# Patient Record
Sex: Female | Born: 1935 | Race: White | Hispanic: No | Marital: Single | Smoking: Never smoker
Health system: Southern US, Community
[De-identification: ages and names within clinical notes are randomized; demographics above are authoritative.]

## PROBLEM LIST (undated history)

## (undated) DIAGNOSIS — C801 Malignant (primary) neoplasm, unspecified: Secondary | ICD-10-CM

## (undated) DIAGNOSIS — I872 Venous insufficiency (chronic) (peripheral): Secondary | ICD-10-CM

## (undated) DIAGNOSIS — I10 Essential (primary) hypertension: Secondary | ICD-10-CM

## (undated) DIAGNOSIS — I83893 Varicose veins of bilateral lower extremities with other complications: Secondary | ICD-10-CM

## (undated) DIAGNOSIS — Z85038 Personal history of other malignant neoplasm of large intestine: Secondary | ICD-10-CM

## (undated) HISTORY — PX: ABDOMINAL HYSTERECTOMY: SHX81

## (undated) HISTORY — PX: LEG SURGERY: SHX1003

## (undated) HISTORY — PX: OTHER SURGICAL HISTORY: SHX169

## (undated) HISTORY — DX: Venous insufficiency (chronic) (peripheral): I87.2

## (undated) HISTORY — PX: COLONOSCOPY: SHX174

## (undated) HISTORY — PX: CARDIOVERSION: SHX1299

## (undated) HISTORY — PX: BREAST BIOPSY: SHX20

## (undated) HISTORY — DX: Essential (primary) hypertension: I10

## (undated) HISTORY — DX: Varicose veins of bilateral lower extremities with other complications: I83.893

## (undated) HISTORY — DX: Malignant (primary) neoplasm, unspecified: C80.1

## (undated) HISTORY — DX: Personal history of other malignant neoplasm of large intestine: Z85.038

---

## 2001-06-06 HISTORY — PX: TONGUE BIOPSY: SHX1075

## 2001-06-06 HISTORY — PX: NECK SURGERY: SHX720

## 2004-03-18 ENCOUNTER — Ambulatory Visit: Payer: Self-pay | Admitting: *Deleted

## 2004-03-25 ENCOUNTER — Emergency Department: Payer: Self-pay | Admitting: Emergency Medicine

## 2004-11-23 ENCOUNTER — Ambulatory Visit: Payer: Self-pay

## 2004-11-25 ENCOUNTER — Ambulatory Visit: Payer: Self-pay | Admitting: Otolaryngology

## 2004-11-30 ENCOUNTER — Ambulatory Visit: Payer: Self-pay

## 2004-12-01 ENCOUNTER — Ambulatory Visit: Payer: Self-pay | Admitting: Otolaryngology

## 2004-12-06 ENCOUNTER — Ambulatory Visit: Payer: Self-pay | Admitting: Oncology

## 2004-12-10 ENCOUNTER — Ambulatory Visit: Payer: Self-pay | Admitting: Oncology

## 2004-12-14 ENCOUNTER — Inpatient Hospital Stay: Payer: Self-pay | Admitting: Internal Medicine

## 2005-01-04 ENCOUNTER — Inpatient Hospital Stay: Payer: Self-pay | Admitting: Oncology

## 2005-01-12 ENCOUNTER — Emergency Department: Payer: Self-pay | Admitting: Emergency Medicine

## 2005-01-13 ENCOUNTER — Ambulatory Visit: Payer: Self-pay | Admitting: Oncology

## 2005-02-04 ENCOUNTER — Ambulatory Visit: Payer: Self-pay | Admitting: Oncology

## 2005-03-06 ENCOUNTER — Ambulatory Visit: Payer: Self-pay | Admitting: Oncology

## 2005-04-06 ENCOUNTER — Ambulatory Visit: Payer: Self-pay | Admitting: Oncology

## 2005-05-06 ENCOUNTER — Ambulatory Visit: Payer: Self-pay | Admitting: Oncology

## 2005-05-07 ENCOUNTER — Ambulatory Visit: Payer: Self-pay | Admitting: Oncology

## 2005-06-06 ENCOUNTER — Ambulatory Visit: Payer: Self-pay | Admitting: Oncology

## 2005-07-07 ENCOUNTER — Ambulatory Visit: Payer: Self-pay | Admitting: Oncology

## 2005-08-04 ENCOUNTER — Ambulatory Visit: Payer: Self-pay | Admitting: Oncology

## 2005-08-11 ENCOUNTER — Ambulatory Visit: Payer: Self-pay | Admitting: Gastroenterology

## 2005-08-23 ENCOUNTER — Other Ambulatory Visit: Payer: Self-pay

## 2005-08-23 ENCOUNTER — Ambulatory Visit: Payer: Self-pay | Admitting: General Surgery

## 2005-08-25 ENCOUNTER — Ambulatory Visit: Payer: Self-pay | Admitting: General Surgery

## 2005-08-31 ENCOUNTER — Inpatient Hospital Stay: Payer: Self-pay | Admitting: General Surgery

## 2005-09-04 ENCOUNTER — Ambulatory Visit: Payer: Self-pay | Admitting: Oncology

## 2005-10-20 ENCOUNTER — Ambulatory Visit: Payer: Self-pay | Admitting: Oncology

## 2005-11-04 ENCOUNTER — Ambulatory Visit: Payer: Self-pay | Admitting: Oncology

## 2005-12-04 ENCOUNTER — Ambulatory Visit: Payer: Self-pay | Admitting: Oncology

## 2006-01-13 ENCOUNTER — Ambulatory Visit: Payer: Self-pay | Admitting: Oncology

## 2006-01-17 ENCOUNTER — Ambulatory Visit: Payer: Self-pay | Admitting: Oncology

## 2006-01-27 ENCOUNTER — Ambulatory Visit: Payer: Self-pay | Admitting: Unknown Physician Specialty

## 2006-02-04 ENCOUNTER — Ambulatory Visit: Payer: Self-pay | Admitting: Oncology

## 2006-03-28 ENCOUNTER — Ambulatory Visit: Payer: Self-pay | Admitting: Oncology

## 2006-04-10 ENCOUNTER — Ambulatory Visit: Payer: Self-pay | Admitting: Oncology

## 2006-04-19 ENCOUNTER — Encounter: Payer: Self-pay | Admitting: Oncology

## 2006-05-06 ENCOUNTER — Ambulatory Visit: Payer: Self-pay | Admitting: Oncology

## 2006-06-20 ENCOUNTER — Ambulatory Visit: Payer: Self-pay | Admitting: Anesthesiology

## 2006-07-11 ENCOUNTER — Ambulatory Visit: Payer: Self-pay | Admitting: Anesthesiology

## 2006-07-14 ENCOUNTER — Ambulatory Visit: Payer: Self-pay | Admitting: Oncology

## 2006-08-05 ENCOUNTER — Ambulatory Visit: Payer: Self-pay | Admitting: Oncology

## 2006-08-08 ENCOUNTER — Ambulatory Visit: Payer: Self-pay | Admitting: Anesthesiology

## 2006-10-13 ENCOUNTER — Ambulatory Visit: Payer: Self-pay | Admitting: Oncology

## 2006-10-17 ENCOUNTER — Ambulatory Visit: Payer: Self-pay | Admitting: Anesthesiology

## 2006-11-05 ENCOUNTER — Ambulatory Visit: Payer: Self-pay | Admitting: Oncology

## 2006-12-05 ENCOUNTER — Ambulatory Visit: Payer: Self-pay | Admitting: Oncology

## 2007-01-05 ENCOUNTER — Ambulatory Visit: Payer: Self-pay | Admitting: Oncology

## 2007-01-12 ENCOUNTER — Ambulatory Visit: Payer: Self-pay | Admitting: Oncology

## 2007-02-05 ENCOUNTER — Ambulatory Visit: Payer: Self-pay | Admitting: Oncology

## 2007-02-21 ENCOUNTER — Ambulatory Visit: Payer: Self-pay | Admitting: Anesthesiology

## 2007-04-07 ENCOUNTER — Ambulatory Visit: Payer: Self-pay | Admitting: Oncology

## 2007-04-27 ENCOUNTER — Ambulatory Visit: Payer: Self-pay | Admitting: Oncology

## 2007-05-07 ENCOUNTER — Ambulatory Visit: Payer: Self-pay | Admitting: Oncology

## 2007-08-03 ENCOUNTER — Ambulatory Visit: Payer: Self-pay | Admitting: Oncology

## 2007-08-09 ENCOUNTER — Ambulatory Visit: Payer: Self-pay | Admitting: Oncology

## 2007-09-05 ENCOUNTER — Ambulatory Visit: Payer: Self-pay | Admitting: Oncology

## 2007-10-05 ENCOUNTER — Ambulatory Visit: Payer: Self-pay | Admitting: Oncology

## 2007-11-26 ENCOUNTER — Ambulatory Visit: Payer: Self-pay | Admitting: General Surgery

## 2008-01-18 ENCOUNTER — Ambulatory Visit: Payer: Self-pay | Admitting: Oncology

## 2008-02-05 ENCOUNTER — Ambulatory Visit: Payer: Self-pay | Admitting: Oncology

## 2008-03-06 ENCOUNTER — Ambulatory Visit: Payer: Self-pay | Admitting: Oncology

## 2008-05-06 ENCOUNTER — Ambulatory Visit: Payer: Self-pay | Admitting: Oncology

## 2008-05-16 ENCOUNTER — Ambulatory Visit: Payer: Self-pay | Admitting: Oncology

## 2008-06-06 ENCOUNTER — Ambulatory Visit: Payer: Self-pay | Admitting: Oncology

## 2008-09-04 ENCOUNTER — Ambulatory Visit: Payer: Self-pay | Admitting: Oncology

## 2008-09-22 ENCOUNTER — Ambulatory Visit: Payer: Self-pay | Admitting: Oncology

## 2008-10-04 ENCOUNTER — Ambulatory Visit: Payer: Self-pay | Admitting: Oncology

## 2009-01-23 ENCOUNTER — Ambulatory Visit: Payer: Self-pay | Admitting: Oncology

## 2009-02-04 ENCOUNTER — Ambulatory Visit: Payer: Self-pay | Admitting: Oncology

## 2009-05-15 ENCOUNTER — Ambulatory Visit: Payer: Self-pay | Admitting: Oncology

## 2009-05-25 ENCOUNTER — Ambulatory Visit: Payer: Self-pay | Admitting: Oncology

## 2009-06-06 ENCOUNTER — Ambulatory Visit: Payer: Self-pay | Admitting: Oncology

## 2009-07-03 ENCOUNTER — Ambulatory Visit: Payer: Self-pay | Admitting: Oncology

## 2009-07-07 ENCOUNTER — Ambulatory Visit: Payer: Self-pay | Admitting: Oncology

## 2009-12-04 ENCOUNTER — Ambulatory Visit: Payer: Self-pay | Admitting: Oncology

## 2009-12-18 ENCOUNTER — Ambulatory Visit: Payer: Self-pay | Admitting: Oncology

## 2010-01-01 ENCOUNTER — Ambulatory Visit: Payer: Self-pay | Admitting: Oncology

## 2010-01-04 ENCOUNTER — Ambulatory Visit: Payer: Self-pay | Admitting: Oncology

## 2010-07-02 ENCOUNTER — Ambulatory Visit: Payer: Self-pay | Admitting: Oncology

## 2010-07-03 LAB — CEA: CEA: 4.3 ng/mL (ref 0.0–4.7)

## 2010-07-07 ENCOUNTER — Ambulatory Visit: Payer: Self-pay | Admitting: Oncology

## 2010-12-31 ENCOUNTER — Ambulatory Visit: Payer: Self-pay | Admitting: Oncology

## 2011-01-01 LAB — CEA: CEA: 6.6 ng/mL — ABNORMAL HIGH (ref 0.0–4.7)

## 2011-01-05 ENCOUNTER — Ambulatory Visit: Payer: Self-pay | Admitting: Oncology

## 2011-04-29 ENCOUNTER — Emergency Department: Payer: Self-pay | Admitting: Internal Medicine

## 2011-07-11 ENCOUNTER — Ambulatory Visit: Payer: Self-pay | Admitting: Oncology

## 2011-07-11 LAB — CBC CANCER CENTER
Basophil #: 0 x10 3/mm (ref 0.0–0.1)
Basophil %: 0.4 %
HCT: 34.6 % — ABNORMAL LOW (ref 35.0–47.0)
HGB: 12 g/dL (ref 12.0–16.0)
Lymphocyte #: 0.5 x10 3/mm — ABNORMAL LOW (ref 1.0–3.6)
Lymphocyte %: 9.6 %
MCV: 93 fL (ref 80–100)
Monocyte %: 6.6 %
Neutrophil #: 4.6 x10 3/mm (ref 1.4–6.5)
RDW: 12.8 % (ref 11.5–14.5)
WBC: 5.6 x10 3/mm (ref 3.6–11.0)

## 2011-07-11 LAB — COMPREHENSIVE METABOLIC PANEL
Calcium, Total: 8.7 mg/dL (ref 8.5–10.1)
Chloride: 100 mmol/L (ref 98–107)
Co2: 29 mmol/L (ref 21–32)
EGFR (African American): >60
EGFR (Non-African Amer.): >60
SGOT(AST): 20 U/L (ref 15–37)
SGPT (ALT): 17 U/L

## 2011-07-12 LAB — CEA: CEA: 6.8 ng/mL — ABNORMAL HIGH (ref 0.0–4.7)

## 2011-08-05 ENCOUNTER — Ambulatory Visit: Payer: Self-pay | Admitting: Oncology

## 2011-08-23 ENCOUNTER — Ambulatory Visit: Payer: Self-pay | Admitting: Oncology

## 2011-08-30 ENCOUNTER — Ambulatory Visit: Payer: Self-pay | Admitting: Neurology

## 2011-09-05 ENCOUNTER — Ambulatory Visit: Payer: Self-pay | Admitting: Oncology

## 2011-11-29 ENCOUNTER — Ambulatory Visit: Payer: Self-pay | Admitting: Cardiovascular Disease

## 2012-01-04 ENCOUNTER — Ambulatory Visit: Payer: Self-pay | Admitting: General Surgery

## 2012-01-23 ENCOUNTER — Ambulatory Visit: Payer: Self-pay | Admitting: Oncology

## 2012-01-23 LAB — CBC CANCER CENTER
Basophil %: 1.2 %
HCT: 39.3 % (ref 35.0–47.0)
HGB: 12.9 g/dL (ref 12.0–16.0)
Lymphocyte %: 10.4 %
MCHC: 32.9 g/dL (ref 32.0–36.0)
MCV: 97 fL (ref 80–100)
Monocyte %: 8.5 %
Neutrophil #: 4.8 x10 3/mm (ref 1.4–6.5)
RBC: 4.06 10*6/uL (ref 3.80–5.20)
WBC: 6.2 x10 3/mm (ref 3.6–11.0)

## 2012-01-23 LAB — TSH: Thyroid Stimulating Horm: 7.29 u[IU]/mL — ABNORMAL HIGH

## 2012-01-24 LAB — CEA: CEA: 10.4 ng/mL — ABNORMAL HIGH (ref 0.0–4.7)

## 2012-02-05 ENCOUNTER — Ambulatory Visit: Payer: Self-pay | Admitting: Oncology

## 2012-09-18 ENCOUNTER — Ambulatory Visit: Payer: Self-pay | Admitting: Oncology

## 2012-12-03 ENCOUNTER — Inpatient Hospital Stay: Payer: Self-pay | Admitting: Internal Medicine

## 2012-12-03 LAB — URINALYSIS, COMPLETE
Hyaline Cast: 1
Leukocyte Esterase: NEGATIVE
Nitrite: NEGATIVE
Ph: 5 (ref 4.5–8.0)
Protein: NEGATIVE
RBC,UR: <1 /HPF (ref 0–5)
Specific Gravity: 1.008 (ref 1.003–1.030)
Squamous Epithelial: <1
WBC UR: <1 /HPF (ref 0–5)

## 2012-12-03 LAB — COMPREHENSIVE METABOLIC PANEL
Alkaline Phosphatase: 95 U/L (ref 50–136)
Anion Gap: 9 (ref 7–16)
BUN: 9 mg/dL (ref 7–18)
Creatinine: 0.88 mg/dL (ref 0.60–1.30)
Glucose: 97 mg/dL (ref 65–99)
SGOT(AST): 25 U/L (ref 15–37)
SGPT (ALT): 18 U/L (ref 12–78)
Total Protein: 6.7 g/dL (ref 6.4–8.2)

## 2012-12-03 LAB — BILIRUBIN, DIRECT: Bilirubin, Direct: 0.3 mg/dL — ABNORMAL HIGH (ref 0.00–0.20)

## 2012-12-03 LAB — CK TOTAL AND CKMB (NOT AT ARMC)
CK, Total: 69 U/L (ref 21–215)
CK-MB: 2.5 ng/mL (ref 0.5–3.6)

## 2012-12-03 LAB — DRUG SCREEN, URINE
Amphetamines, Ur Screen: NEGATIVE (ref ?–1000)
Barbiturates, Ur Screen: NEGATIVE (ref ?–200)
Cannabinoid 50 Ng, Ur ~~LOC~~: NEGATIVE (ref ?–50)
MDMA (Ecstasy)Ur Screen: NEGATIVE (ref ?–500)
Opiate, Ur Screen: NEGATIVE (ref ?–300)
Tricyclic, Ur Screen: NEGATIVE (ref ?–1000)

## 2012-12-03 LAB — CBC
HCT: 36.5 % (ref 35.0–47.0)
HGB: 12.3 g/dL (ref 12.0–16.0)
MCHC: 33.7 g/dL (ref 32.0–36.0)
Platelet: 184 10*3/uL (ref 150–440)
WBC: 3.4 10*3/uL — ABNORMAL LOW (ref 3.6–11.0)

## 2012-12-03 LAB — TROPONIN I
Troponin-I: <0.02 ng/mL
Troponin-I: 0.04 ng/mL

## 2012-12-03 LAB — ETHANOL: Ethanol: <3 mg/dL

## 2012-12-03 LAB — TSH: Thyroid Stimulating Horm: 3.39 u[IU]/mL

## 2012-12-03 LAB — PROTIME-INR: INR: 3.5

## 2012-12-04 LAB — COMPREHENSIVE METABOLIC PANEL
Alkaline Phosphatase: 99 U/L (ref 50–136)
BUN: 9 mg/dL (ref 7–18)
Calcium, Total: 8.5 mg/dL (ref 8.5–10.1)
Chloride: 106 mmol/L (ref 98–107)
Co2: 25 mmol/L (ref 21–32)
EGFR (Non-African Amer.): >60
Sodium: 139 mmol/L (ref 136–145)

## 2012-12-04 LAB — CBC WITH DIFFERENTIAL/PLATELET
Eosinophil %: 2.1 %
HCT: 36.3 % (ref 35.0–47.0)
Lymphocyte #: 0.5 10*3/uL — ABNORMAL LOW (ref 1.0–3.6)
Monocyte #: 0.5 x10 3/mm (ref 0.2–0.9)
Monocyte %: 9.3 %
Neutrophil #: 4.5 10*3/uL (ref 1.4–6.5)
Platelet: 184 10*3/uL (ref 150–440)
RBC: 3.87 10*6/uL (ref 3.80–5.20)
RDW: 14.5 % (ref 11.5–14.5)
WBC: 5.7 10*3/uL (ref 3.6–11.0)

## 2012-12-04 LAB — PROTIME-INR: Prothrombin Time: 29.8 secs — ABNORMAL HIGH (ref 11.5–14.7)

## 2012-12-04 LAB — CK TOTAL AND CKMB (NOT AT ARMC): CK, Total: 67 U/L (ref 21–215)

## 2012-12-05 LAB — CBC WITH DIFFERENTIAL/PLATELET
Basophil #: 0.1 10*3/uL (ref 0.0–0.1)
Basophil %: 1.4 %
Eosinophil %: 5.1 %
Lymphocyte #: 0.6 10*3/uL — ABNORMAL LOW (ref 1.0–3.6)
Lymphocyte %: 12.9 %
Platelet: 184 10*3/uL (ref 150–440)

## 2012-12-06 LAB — CBC WITH DIFFERENTIAL/PLATELET
Basophil #: 0.1 10*3/uL (ref 0.0–0.1)
Lymphocyte %: 11.7 %
MCH: 32.1 pg (ref 26.0–34.0)
MCHC: 34 g/dL (ref 32.0–36.0)
Monocyte #: 0.5 x10 3/mm (ref 0.2–0.9)
Neutrophil #: 3.6 10*3/uL (ref 1.4–6.5)
RDW: 14.8 % — ABNORMAL HIGH (ref 11.5–14.5)

## 2012-12-13 ENCOUNTER — Inpatient Hospital Stay: Payer: Self-pay | Admitting: Internal Medicine

## 2012-12-13 LAB — CBC WITH DIFFERENTIAL/PLATELET
Eosinophil #: 0.1 10*3/uL (ref 0.0–0.7)
HGB: 13.2 g/dL (ref 12.0–16.0)
Lymphocyte #: 1 10*3/uL (ref 1.0–3.6)
MCH: 31.6 pg (ref 26.0–34.0)
Monocyte #: 0.8 x10 3/mm (ref 0.2–0.9)
Monocyte %: 10.6 %
Platelet: 220 10*3/uL (ref 150–440)

## 2012-12-13 LAB — COMPREHENSIVE METABOLIC PANEL
Anion Gap: 6 — ABNORMAL LOW (ref 7–16)
EGFR (Non-African Amer.): 50 — ABNORMAL LOW
Potassium: 4.4 mmol/L (ref 3.5–5.1)
SGOT(AST): 26 U/L (ref 15–37)
Total Protein: 7 g/dL (ref 6.4–8.2)

## 2012-12-13 LAB — URINALYSIS, COMPLETE
Glucose,UR: NEGATIVE mg/dL (ref 0–75)
Ph: 5 (ref 4.5–8.0)
Protein: 100

## 2012-12-13 LAB — PROTIME-INR: INR: 2.4

## 2012-12-13 LAB — APTT: Activated PTT: 41.2 secs — ABNORMAL HIGH (ref 23.6–35.9)

## 2012-12-13 LAB — CK TOTAL AND CKMB (NOT AT ARMC): CK-MB: 2.5 ng/mL (ref 0.5–3.6)

## 2012-12-14 LAB — BASIC METABOLIC PANEL
BUN: 14 mg/dL (ref 7–18)
Chloride: 107 mmol/L (ref 98–107)
Co2: 22 mmol/L (ref 21–32)
Creatinine: 1.03 mg/dL (ref 0.60–1.30)
EGFR (Non-African Amer.): 53 — ABNORMAL LOW
Glucose: 78 mg/dL (ref 65–99)
Osmolality: 277 (ref 275–301)
Sodium: 139 mmol/L (ref 136–145)

## 2012-12-14 LAB — MAGNESIUM: Magnesium: 1.2 mg/dL — ABNORMAL LOW

## 2012-12-14 LAB — CBC WITH DIFFERENTIAL/PLATELET
Basophil #: 0 10*3/uL (ref 0.0–0.1)
Basophil %: 0.8 %
Eosinophil #: 0.1 10*3/uL (ref 0.0–0.7)
Eosinophil %: 1.5 %
HCT: 34.9 % — ABNORMAL LOW (ref 35.0–47.0)
HGB: 11.5 g/dL — ABNORMAL LOW (ref 12.0–16.0)
MCHC: 32.9 g/dL (ref 32.0–36.0)
MCV: 96 fL (ref 80–100)
Neutrophil %: 70.9 %
Platelet: 173 10*3/uL (ref 150–440)
RDW: 14.3 % (ref 11.5–14.5)
WBC: 4.4 10*3/uL (ref 3.6–11.0)

## 2012-12-14 LAB — COMPREHENSIVE METABOLIC PANEL
Albumin: 2.9 g/dL — ABNORMAL LOW (ref 3.4–5.0)
Alkaline Phosphatase: 90 U/L (ref 50–136)
Anion Gap: 5 — ABNORMAL LOW (ref 7–16)
Bilirubin,Total: 0.8 mg/dL (ref 0.2–1.0)
Calcium, Total: 8.4 mg/dL — ABNORMAL LOW (ref 8.5–10.1)
Chloride: 107 mmol/L (ref 98–107)
Creatinine: 0.99 mg/dL (ref 0.60–1.30)
EGFR (African American): >60
EGFR (Non-African Amer.): 56 — ABNORMAL LOW
Osmolality: 277 (ref 275–301)
Potassium: 4.1 mmol/L (ref 3.5–5.1)
Sodium: 139 mmol/L (ref 136–145)

## 2012-12-15 LAB — URINE CULTURE

## 2012-12-16 LAB — CBC WITH DIFFERENTIAL/PLATELET
Basophil #: 0 10*3/uL (ref 0.0–0.1)
Eosinophil #: 0 10*3/uL (ref 0.0–0.7)
HCT: 41.7 % (ref 35.0–47.0)
HGB: 13.9 g/dL (ref 12.0–16.0)
Lymphocyte #: 0.6 10*3/uL — ABNORMAL LOW (ref 1.0–3.6)
MCHC: 33.2 g/dL (ref 32.0–36.0)
Monocyte %: 7.5 %
Neutrophil #: 8.4 10*3/uL — ABNORMAL HIGH (ref 1.4–6.5)
Neutrophil %: 85.7 %
Platelet: 242 10*3/uL (ref 150–440)
RBC: 4.38 10*6/uL (ref 3.80–5.20)
WBC: 9.8 10*3/uL (ref 3.6–11.0)

## 2012-12-17 LAB — BASIC METABOLIC PANEL
Anion Gap: 7 (ref 7–16)
BUN: 17 mg/dL (ref 7–18)
Calcium, Total: 8.8 mg/dL (ref 8.5–10.1)
Chloride: 107 mmol/L (ref 98–107)
Co2: 26 mmol/L (ref 21–32)
Creatinine: 1.03 mg/dL (ref 0.60–1.30)
EGFR (African American): >60
EGFR (Non-African Amer.): 53 — ABNORMAL LOW
Glucose: 96 mg/dL (ref 65–99)
Osmolality: 281 (ref 275–301)
Potassium: 4 mmol/L (ref 3.5–5.1)

## 2012-12-17 LAB — PROTIME-INR
INR: 3.1
Prothrombin Time: 31 secs — ABNORMAL HIGH (ref 11.5–14.7)

## 2012-12-18 LAB — CBC WITH DIFFERENTIAL/PLATELET
Basophil #: 0 10*3/uL (ref 0.0–0.1)
Basophil %: 0.5 %
Eosinophil #: 0 10*3/uL (ref 0.0–0.7)
Eosinophil %: 0.7 %
HCT: 40.7 % (ref 35.0–47.0)
Lymphocyte #: 0.4 10*3/uL — ABNORMAL LOW (ref 1.0–3.6)
MCHC: 33.5 g/dL (ref 32.0–36.0)
Monocyte #: 0.5 x10 3/mm (ref 0.2–0.9)
Monocyte %: 7.8 %
Neutrophil #: 5.1 10*3/uL (ref 1.4–6.5)
Platelet: 205 10*3/uL (ref 150–440)
RBC: 4.31 10*6/uL (ref 3.80–5.20)
WBC: 6.1 10*3/uL (ref 3.6–11.0)

## 2012-12-18 LAB — CULTURE, BLOOD (SINGLE)

## 2012-12-18 LAB — BASIC METABOLIC PANEL
Anion Gap: 10 (ref 7–16)
Calcium, Total: 8.9 mg/dL (ref 8.5–10.1)
Chloride: 104 mmol/L (ref 98–107)
Creatinine: 1 mg/dL (ref 0.60–1.30)
EGFR (Non-African Amer.): 55 — ABNORMAL LOW
Glucose: 77 mg/dL (ref 65–99)
Osmolality: 275 (ref 275–301)

## 2012-12-18 LAB — PROTIME-INR: INR: 2.9

## 2012-12-18 LAB — MAGNESIUM: Magnesium: 1.4 mg/dL — ABNORMAL LOW

## 2012-12-19 LAB — CBC WITH DIFFERENTIAL/PLATELET
Basophil #: 0 10*3/uL (ref 0.0–0.1)
Basophil %: 0.6 %
Eosinophil %: 1.2 %
HGB: 13.6 g/dL (ref 12.0–16.0)
Lymphocyte %: 7.1 %
MCH: 31.4 pg (ref 26.0–34.0)
MCHC: 33.9 g/dL (ref 32.0–36.0)
Monocyte #: 0.5 x10 3/mm (ref 0.2–0.9)
Neutrophil #: 5.6 10*3/uL (ref 1.4–6.5)
Neutrophil %: 84 %
RDW: 14.5 % (ref 11.5–14.5)
WBC: 6.7 10*3/uL (ref 3.6–11.0)

## 2012-12-19 LAB — BASIC METABOLIC PANEL
Anion Gap: 5 — ABNORMAL LOW (ref 7–16)
Calcium, Total: 8.3 mg/dL — ABNORMAL LOW (ref 8.5–10.1)
EGFR (Non-African Amer.): >60
Glucose: 119 mg/dL — ABNORMAL HIGH (ref 65–99)
Osmolality: 274 (ref 275–301)
Potassium: 3.4 mmol/L — ABNORMAL LOW (ref 3.5–5.1)
Sodium: 137 mmol/L (ref 136–145)

## 2012-12-19 LAB — PROTIME-INR: INR: 2.1

## 2012-12-20 ENCOUNTER — Encounter: Payer: Self-pay | Admitting: *Deleted

## 2013-01-04 ENCOUNTER — Encounter (HOSPITAL_COMMUNITY): Payer: Self-pay | Admitting: Emergency Medicine

## 2013-01-04 ENCOUNTER — Emergency Department (HOSPITAL_COMMUNITY): Payer: Medicare Other

## 2013-01-04 ENCOUNTER — Inpatient Hospital Stay (HOSPITAL_COMMUNITY)
Admission: EM | Admit: 2013-01-04 | Discharge: 2013-01-08 | DRG: 689 | Disposition: A | Discharge status: Home Health | Payer: Medicare Other | Attending: Internal Medicine | Admitting: Internal Medicine

## 2013-01-04 DIAGNOSIS — I059 Rheumatic mitral valve disease, unspecified: Secondary | ICD-10-CM | POA: Diagnosis present

## 2013-01-04 DIAGNOSIS — T50995A Adverse effect of other drugs, medicaments and biological substances, initial encounter: Secondary | ICD-10-CM | POA: Diagnosis present

## 2013-01-04 DIAGNOSIS — T460X5A Adverse effect of cardiac-stimulant glycosides and drugs of similar action, initial encounter: Secondary | ICD-10-CM | POA: Diagnosis present

## 2013-01-04 DIAGNOSIS — Z85038 Personal history of other malignant neoplasm of large intestine: Secondary | ICD-10-CM | POA: Diagnosis present

## 2013-01-04 DIAGNOSIS — N390 Urinary tract infection, site not specified: Principal | ICD-10-CM | POA: Diagnosis present

## 2013-01-04 DIAGNOSIS — R001 Bradycardia, unspecified: Secondary | ICD-10-CM

## 2013-01-04 DIAGNOSIS — E860 Dehydration: Secondary | ICD-10-CM | POA: Diagnosis present

## 2013-01-04 DIAGNOSIS — I498 Other specified cardiac arrhythmias: Secondary | ICD-10-CM | POA: Diagnosis present

## 2013-01-04 DIAGNOSIS — F0390 Unspecified dementia without behavioral disturbance: Secondary | ICD-10-CM | POA: Diagnosis present

## 2013-01-04 DIAGNOSIS — R4182 Altered mental status, unspecified: Secondary | ICD-10-CM | POA: Diagnosis present

## 2013-01-04 DIAGNOSIS — G9340 Encephalopathy, unspecified: Secondary | ICD-10-CM | POA: Diagnosis present

## 2013-01-04 DIAGNOSIS — I4891 Unspecified atrial fibrillation: Secondary | ICD-10-CM | POA: Diagnosis present

## 2013-01-04 DIAGNOSIS — E876 Hypokalemia: Secondary | ICD-10-CM | POA: Diagnosis present

## 2013-01-04 DIAGNOSIS — I10 Essential (primary) hypertension: Secondary | ICD-10-CM | POA: Diagnosis present

## 2013-01-04 DIAGNOSIS — I82A19 Acute embolism and thrombosis of unspecified axillary vein: Secondary | ICD-10-CM | POA: Diagnosis not present

## 2013-01-04 DIAGNOSIS — J4489 Other specified chronic obstructive pulmonary disease: Secondary | ICD-10-CM | POA: Diagnosis present

## 2013-01-04 DIAGNOSIS — J449 Chronic obstructive pulmonary disease, unspecified: Secondary | ICD-10-CM | POA: Diagnosis present

## 2013-01-04 DIAGNOSIS — Z7901 Long term (current) use of anticoagulants: Secondary | ICD-10-CM

## 2013-01-04 DIAGNOSIS — Z79899 Other long term (current) drug therapy: Secondary | ICD-10-CM

## 2013-01-04 LAB — URINALYSIS, ROUTINE W REFLEX MICROSCOPIC
Glucose, UA: NEGATIVE mg/dL
Nitrite: NEGATIVE
Specific Gravity, Urine: 1.02 (ref 1.005–1.030)
pH: 6 (ref 5.0–8.0)

## 2013-01-04 LAB — COMPREHENSIVE METABOLIC PANEL
ALT: 10 U/L (ref 0–35)
Albumin: 2.8 g/dL — ABNORMAL LOW (ref 3.5–5.2)
BUN: 21 mg/dL (ref 6–23)
Calcium: 8.6 mg/dL (ref 8.4–10.5)
GFR calc Af Amer: >90 mL/min (ref >90)
Glucose, Bld: 105 mg/dL — ABNORMAL HIGH (ref 70–99)
Potassium: 3.2 mEq/L — ABNORMAL LOW (ref 3.5–5.1)
Sodium: 143 mEq/L (ref 135–145)
Total Protein: 6.1 g/dL (ref 6.0–8.3)

## 2013-01-04 LAB — URINE MICROSCOPIC-ADD ON

## 2013-01-04 LAB — CBC WITH DIFFERENTIAL/PLATELET
Basophils Relative: 0 % (ref 0–1)
Eosinophils Absolute: 0 10*3/uL (ref 0.0–0.7)
Eosinophils Relative: 0 % (ref 0–5)
Lymphs Abs: 0.9 10*3/uL (ref 0.7–4.0)
MCH: 30.6 pg (ref 26.0–34.0)
MCHC: 32.8 g/dL (ref 30.0–36.0)
MCV: 93.4 fL (ref 78.0–100.0)
Neutrophils Relative %: 79 % — ABNORMAL HIGH (ref 43–77)
Platelets: 215 10*3/uL (ref 150–400)
RBC: 4.08 MIL/uL (ref 3.87–5.11)

## 2013-01-04 MED ORDER — DONEPEZIL HCL 5 MG PO TABS
5.0000 mg | ORAL_TABLET | Freq: Every day | ORAL | Status: DC
  Administered 2013-01-04 – 2013-01-07 (×4): 5 mg via ORAL
  Filled 2013-01-04 (×5): qty 1

## 2013-01-04 MED ORDER — HEPARIN SODIUM (PORCINE) 5000 UNIT/ML IJ SOLN
5000.0000 [IU] | Freq: Three times a day (TID) | INTRAMUSCULAR | Status: DC
  Administered 2013-01-04 – 2013-01-05 (×2): 5000 [IU] via SUBCUTANEOUS
  Filled 2013-01-04 (×5): qty 1

## 2013-01-04 MED ORDER — BUDESONIDE-FORMOTEROL FUMARATE 80-4.5 MCG/ACT IN AERO
2.0000 | INHALATION_SPRAY | Freq: Two times a day (BID) | RESPIRATORY_TRACT | Status: DC
  Administered 2013-01-05 – 2013-01-08 (×7): 2 via RESPIRATORY_TRACT
  Filled 2013-01-04: qty 6.9

## 2013-01-04 MED ORDER — WARFARIN - PHARMACIST DOSING INPATIENT
Freq: Every day | Status: DC
  Administered 2013-01-07 – 2013-01-08 (×2)

## 2013-01-04 MED ORDER — POTASSIUM CHLORIDE 10 MEQ/100ML IV SOLN
10.0000 meq | INTRAVENOUS | Status: AC
  Administered 2013-01-04 – 2013-01-05 (×3): 10 meq via INTRAVENOUS
  Filled 2013-01-04 (×3): qty 100

## 2013-01-04 MED ORDER — POTASSIUM CHLORIDE IN NACL 20-0.9 MEQ/L-% IV SOLN
INTRAVENOUS | Status: DC
  Administered 2013-01-04: 22:00:00 via INTRAVENOUS
  Administered 2013-01-06: 100 mL/h via INTRAVENOUS
  Administered 2013-01-06 – 2013-01-07 (×2): via INTRAVENOUS
  Administered 2013-01-07: 100 mL/h via INTRAVENOUS
  Administered 2013-01-08: 06:00:00 via INTRAVENOUS
  Filled 2013-01-04 (×12): qty 1000

## 2013-01-04 MED ORDER — LEVOFLOXACIN IN D5W 750 MG/150ML IV SOLN
750.0000 mg | Freq: Once | INTRAVENOUS | Status: AC
  Administered 2013-01-04: 750 mg via INTRAVENOUS
  Filled 2013-01-04: qty 150

## 2013-01-04 MED ORDER — SODIUM CHLORIDE 0.9 % IV BOLUS (SEPSIS)
1000.0000 mL | Freq: Once | INTRAVENOUS | Status: AC
  Administered 2013-01-04: 1000 mL via INTRAVENOUS

## 2013-01-04 MED ORDER — LISINOPRIL 10 MG PO TABS
10.0000 mg | ORAL_TABLET | Freq: Every day | ORAL | Status: DC
  Administered 2013-01-05 – 2013-01-08 (×4): 10 mg via ORAL
  Filled 2013-01-04 (×4): qty 1

## 2013-01-04 MED ORDER — SODIUM CHLORIDE 0.9 % IJ SOLN
3.0000 mL | Freq: Two times a day (BID) | INTRAMUSCULAR | Status: DC
  Administered 2013-01-04 – 2013-01-08 (×4): 3 mL via INTRAVENOUS

## 2013-01-04 MED ORDER — LEVOFLOXACIN IN D5W 250 MG/50ML IV SOLN
250.0000 mg | INTRAVENOUS | Status: DC
  Administered 2013-01-05 – 2013-01-06 (×2): 250 mg via INTRAVENOUS
  Filled 2013-01-04 (×2): qty 50

## 2013-01-04 NOTE — Progress Notes (Signed)
Tried to call ED for report. Left my name and number for them to call back.

## 2013-01-04 NOTE — ED Provider Notes (Signed)
CSN: [id number]     Arrival date & time 01/04/13  1614 History     First MD Initiated Contact with Patient 01/04/13 1622     Chief Complaint  Patient presents with  . Dehydration   (Consider location/radiation/quality/duration/timing/severity/associated sxs/prior Treatment) Patient is a 77 y.o. female presenting with altered mental status and general illness. The history is provided by the patient, a relative and medical records.  Altered Mental Status Presenting symptoms: confusion, disorientation and lethargy   Severity:  Moderate Most recent episode:  Today Timing:  Constant Progression:  Unchanged Chronicity:  Recurrent Associated symptoms: abdominal pain   Associated symptoms: no fever, no headaches, no nausea, no palpitations, no rash, no seizures and no vomiting   Illness Severity:  Moderate Onset quality:  Gradual Timing:  Constant Progression:  Unchanged Chronicity:  Recurrent Associated symptoms: abdominal pain and fatigue   Associated symptoms: no chest pain, no congestion, no cough, no diarrhea, no ear pain, no fever, no headaches, no nausea, no rash, no shortness of breath, no sore throat, no vomiting and no wheezing     Past Medical History  Diagnosis Date  . Hypertension   . Unspecified venous (peripheral) insufficiency   . Personal history of malignant neoplasm of large intestine   . Cancer     colon  . Varicose veins of lower extremities with other complications    Past Surgical History  Procedure Laterality Date  . Neck surgery  2003  . Tongue biopsy  2003  . Abdominal hysterectomy    . Breast biopsy    . Colonoscopy    . Gastric perforation repair     . Cardioversion    . Leg surgery Left    Family History  Problem Relation Age of Onset  . Kidney cancer Mother    History  Substance Use Topics  . Smoking status: Never Smoker   . Smokeless tobacco: Never Used  . Alcohol Use: No   OB History   Grav Para Term Preterm Abortions TAB SAB Ect  Mult Living                 Review of Systems  Constitutional: Positive for activity change, appetite change and fatigue. Negative for fever, chills and diaphoresis.  HENT: Negative for ear pain, congestion, sore throat, facial swelling, mouth sores, trouble swallowing, neck pain and neck stiffness.   Eyes: Negative.   Respiratory: Negative for apnea, cough, chest tightness, shortness of breath and wheezing.   Cardiovascular: Negative for chest pain, palpitations and leg swelling.  Gastrointestinal: Positive for abdominal pain. Negative for nausea, vomiting, diarrhea and abdominal distention.  Genitourinary: Negative for hematuria, flank pain, vaginal discharge, difficulty urinating and menstrual problem.  Musculoskeletal: Positive for back pain. Negative for gait problem.  Skin: Negative for rash and wound.  Neurological: Negative for dizziness, tremors, seizures, syncope, facial asymmetry, numbness and headaches.  Psychiatric/Behavioral: Positive for confusion and altered mental status.  All other systems reviewed and are negative.    Allergies  Ambien; Codeine; Penicillins; and Tartrazine  Home Medications   Current Outpatient Rx  Name  Route  Sig  Dispense  Refill  . budesonide-formoterol (SYMBICORT) 80-4.5 MCG/ACT inhaler   Inhalation   Inhale 2 puffs into the lungs 2 (two) times daily.         . digoxin (LANOXIN) 0.25 MG tablet   Oral   Take 0.25 mg by mouth daily.         Marland Kitchen donepezil (ARICEPT) 5 MG tablet  Oral   Take 5 mg by mouth at bedtime.         . haloperidol (HALDOL) 2 MG tablet   Oral   Take 2 mg by mouth 3 (three) times daily as needed. For delirium         . lisinopril (PRINIVIL,ZESTRIL) 10 MG tablet   Oral   Take 10 mg by mouth daily.         . metoprolol (LOPRESSOR) 50 MG tablet   Oral   Take 50 mg by mouth 2 (two) times daily.         Marland Kitchen warfarin (COUMADIN) 2 MG tablet   Oral   Take 2 mg by mouth daily. Takes daily at 5pm           BP 155/48  Pulse 40  Temp(Src) 98.1 F (36.7 C) (Oral)  Resp 15  SpO2 100% Physical Exam  Nursing note and vitals reviewed. Constitutional: She appears listless. No distress.  HENT:  Head: Normocephalic and atraumatic.  Right Ear: External ear normal.  Left Ear: External ear normal.  Nose: Nose normal.  Mouth/Throat: Oropharynx is clear and moist. No oropharyngeal exudate.  Eyes: Conjunctivae and EOM are normal. Pupils are equal, round, and reactive to light. Right eye exhibits no discharge. Left eye exhibits no discharge.  Neck: Normal range of motion. Neck supple. No JVD present. No tracheal deviation present. No thyromegaly present.  Cardiovascular: Normal heart sounds and intact distal pulses.  An irregularly irregular rhythm present. Bradycardia present.  Exam reveals no gallop and no friction rub.   No murmur heard. Pulmonary/Chest: Effort normal and breath sounds normal. No respiratory distress. She has no wheezes. She has no rales. She exhibits no tenderness.  Abdominal: Soft. Bowel sounds are normal. She exhibits no distension. There is tenderness (Pain with palpation over the suprapubic region). There is no rebound and no guarding.  Musculoskeletal: Normal range of motion.  Lymphadenopathy:    She has no cervical adenopathy.  Neurological: She appears listless. No cranial nerve deficit. GCS eye subscore is 4. GCS verbal subscore is 4. GCS motor subscore is 6.  Patient is listless. She is oriented to self only. She often does not respond to questions and seems tired.  Skin: Skin is warm. No rash noted. She is not diaphoretic.    ED Course   Procedures (including critical care time)  Labs Reviewed  DIGOXIN LEVEL - Abnormal; Notable for the following:    Digoxin Level 2.5 (*)    All other components within normal limits  CBC WITH DIFFERENTIAL - Abnormal; Notable for the following:    Neutrophils Relative % 79 (*)    Lymphocytes Relative 10 (*)    Monocytes Absolute  1.1 (*)    All other components within normal limits  COMPREHENSIVE METABOLIC PANEL - Abnormal; Notable for the following:    Potassium 3.2 (*)    Glucose, Bld 105 (*)    Albumin 2.8 (*)    Total Bilirubin 1.6 (*)    GFR calc non Af Amer 83 (*)    All other components within normal limits  URINALYSIS, ROUTINE W REFLEX MICROSCOPIC - Abnormal; Notable for the following:    Color, Urine ORANGE (*)    APPearance CLOUDY (*)    Bilirubin Urine MODERATE (*)    Ketones, ur 40 (*)    Protein, ur 30 (*)    Urobilinogen, UA 2.0 (*)    Leukocytes, UA MODERATE (*)    All other components within normal  limits  URINE MICROSCOPIC-ADD ON - Abnormal; Notable for the following:    Bacteria, UA MANY (*)    Casts GRANULAR CAST (*)    All other components within normal limits  URINE CULTURE  CULTURE, BLOOD (ROUTINE X 2)  CULTURE, BLOOD (ROUTINE X 2)  LACTIC ACID, PLASMA   Dg Chest 2 View  01/04/2013   *RADIOLOGY REPORT*  Clinical Data: Dehydration.  CHEST - 2 VIEW  Comparison: None.  Findings: The heart is borderline enlarged.  The mediastinal and hilar contours are within normal limits.  A probable chronic bronchitic type interstitial lung changes but no definite acute infiltrates or pleural effusion.  Low lung volumes with vascular crowding and streaky atelectasis.  The bony thorax is intact.  IMPRESSION: Mild cardiac enlargement. Low lung volumes with vascular crowding and atelectasis. Probable underlying chronic bronchitic type lung changes.   Original Report Authenticated By: Rudie Meyer, M.D.   1. Digoxin toxicity, initial encounter   2. Urinary tract infection   3. Altered mental status     MDM  77 yr old pt w/ PMH of afib on digoxin and lopressor with recent hx of hospitalizations for UTI's here with son with whom she lives reporting that she has change in mental status. Here she is oriented times one and listless complaining of suprapubic pain. No fever but patient in afib on ekg with  bradycardia. Will assess kidney and liver function. Will screen for infection with CXR and cath UA. Will also look at lactate. No septic shock at this time with good blood pressures and no fever. Will also screen for digoxin toxicity given change in mental status. Her beta blocker could also be making her bradycardic.  Patient with elevated dig level, uti, and AMS. Gave levaquin. Will admit to hospital  Case discussed with Dr. Ermalene Postin, MD 01/04/13 [phone]

## 2013-01-04 NOTE — Progress Notes (Addendum)
ANTIBIOTIC CONSULT NOTE - INITIAL  Pharmacy Consult for Levaquin and coumadin Indication: UTI; hx afib  Allergies  Allergen Reactions  . Ambien (Zolpidem Tartrate)     tachycardia    . Codeine Itching  . Penicillins Swelling  . Tartrazine Nausea Only    Vital Signs: Temp: 98.1 F (36.7 C) (08/01 1621) Temp src: Oral (08/01 1621) BP: 149/57 mmHg (08/01 2000) Pulse Rate: 44 (08/01 2000) Intake/Output from previous day:   Intake/Output from this shift:    Labs:  Recent Labs  01/04/13 1650  WBC 9.6  HGB 12.5  PLT 215  CREATININE 0.70   CrCl is unknown because there is no height on file for the current visit. No results found for this basename: VANCOTROUGH, VANCOPEAK, VANCORANDOM, GENTTROUGH, GENTPEAK, GENTRANDOM, TOBRATROUGH, TOBRAPEAK, TOBRARND, AMIKACINPEAK, AMIKACINTROU, AMIKACIN,  in the last 72 hours   Microbiology: No results found for this or any previous visit (from the past 720 hour(s)).  Medical History: Past Medical History  Diagnosis Date  . Hypertension   . Unspecified venous (peripheral) insufficiency   . Personal history of malignant neoplasm of large intestine   . Cancer     colon  . Varicose veins of lower extremities with other complications     Medications:  See med rec   Assessment: Patient is a 77 y.o F presented to the ED with AMS.  In ED, patient was found to be in Afib and bradycardic.  To start levaquin for suspected UTI.  Patient received levaquin 750mg  IV x1 in ED at  [facility].  Patient's also on coumadin PTA for hx afib.  Pt's son stated that she took her coumadin dose for today PTA. Will not stick patient to get INR lab draw tonight since patient already got dose.  Plan:  1) levaquin 250mg  IV q24h (for UTI indication) 2) f/u INR in AM and resume coumadin if appropriate  Angelisse Riso P 01/04/2013,8:15 PM

## 2013-01-04 NOTE — ED Notes (Signed)
Sent by PCP this am for suspected dehydration, possible UTI. Recent Hx of same. Hx Afib

## 2013-01-04 NOTE — H&P (Signed)
Date: 01/05/2013               Patient Name:  Ashley Rubio MRN: [id number]  DOB: [birth year] Age / Sex: 77 y.o., female   PCP: Sherron Monday, MD         Medical Service: Internal Medicine Teaching Service         Attending Physician: Dr. Judyann Munson, MD    First Contact: Dr. Evelena Peat Pager: [phone]  Second Contact: Dr. Lorretta Harp Pager: [phone]       After Hours (After 5p/  First Contact Pager: [phone]  weekends / holidays): Second Contact Pager: [phone]   Chief Complaint: Altered mental status  History of Present Illness: Ashley Rubio is a 77 year old female with past medical history of atrial fibrillation on Coumadin, digoxin and metoprolol; hypertension; and recent hospitalization for urinary tract infection with altered mental status, who presents today with altered mental status. History obtained from the patient and her son, who lives with her.  For the past day, the patient's son notes she has been lethargic, confused, and disoriented. Also endorses decreased by mouth intake as well as diffuse weakness. She has been unable to get out of bed or walk to the restroom. Denies focal weakness or facial drooping. Per the family she may also be having some abdominal pain. No nausea, vomiting, diarrhea. Two and half weeks ago, the patient had similar symptoms along with dysuria, and was hospitalized for urinary tract infection. After a week long hospital stay, she was discharged to a rehabilitation facility, and then to home 6 days ago. The family notes that her mental status did improve when she came home from the rehabilitation facility, but it never returned to baseline. Prior to the hospitalization, the patient lived alone and was independent in all activities of daily living, though she did have a diagnosis of mild baseline dementia. No current alcohol use.  The family notes she was also hospitalized about one month ago for increased lower extremity edema, which resolved. It is  unknown if she has a history of heart failure. She does have a history of venous insufficiency. Her cardiologist is Dr. Park Breed in [place], [place].    Meds: Current Facility-Administered Medications  Medication Dose Route Frequency Provider Last Rate Last Dose  . 0.9 % NaCl with KCl 20 mEq/ L  infusion   Intravenous Continuous Genelle Gather, MD 100 mL/hr at 01/04/13 2214    . budesonide-formoterol (SYMBICORT) 80-4.5 MCG/ACT inhaler 2 puff  2 puff Inhalation BID Genelle Gather, MD      . donepezil (ARICEPT) tablet 5 mg  5 mg Oral QHS Genelle Gather, MD   5 mg at 01/04/13 2215  . heparin injection 5,000 Units  5,000 Units Subcutaneous Q8H Genelle Gather, MD   5,000 Units at 01/04/13 2215  . Levofloxacin (LEVAQUIN) IVPB 250 mg  250 mg Intravenous Q24H Anh P Pham, RPH      . lisinopril (PRINIVIL,ZESTRIL) tablet 10 mg  10 mg Oral Daily Genelle Gather, MD      . potassium chloride 10 mEq in 100 mL IVPB  10 mEq Intravenous Q1 Hr x 3 Genelle Gather, MD   10 mEq at 01/05/13 0029  . sodium chloride 0.9 % injection 3 mL  3 mL Intravenous Q12H Genelle Gather, MD   3 mL at 01/04/13 2216  . Warfarin - Pharmacist Dosing Inpatient   Does not apply q1800 Lucia Gaskins, Dr [facility]  Allergies: Allergies as of 01/04/2013 - Review Complete 01/04/2013  Allergen Reaction Noted  . Ambien (zolpidem tartrate)  12/20/2012  . Codeine Itching 12/20/2012  . Penicillins Swelling 12/20/2012  . Tartrazine Nausea Only 12/20/2012   Past Medical History  Diagnosis Date  . Hypertension   . Unspecified venous (peripheral) insufficiency   . Personal history of malignant neoplasm of large intestine   . Cancer     colon  . Varicose veins of lower extremities with other complications    Past Surgical History  Procedure Laterality Date  . Neck surgery  2003  . Tongue biopsy  2003  . Abdominal hysterectomy    . Breast biopsy    . Colonoscopy    . Gastric perforation repair     . Cardioversion    . Leg surgery Left      Family History  Problem Relation Age of Onset  . Kidney cancer Mother    History   Social History  . Marital Status: Single    Spouse Name: N/A    Number of Children: N/A  . Years of Education: N/A   Occupational History  . Not on file.   Social History Main Topics  . Smoking status: Never Smoker   . Smokeless tobacco: Never Used  . Alcohol Use: No  . Drug Use: No  . Sexually Active: Not on file   Other Topics Concern  . Not on file   Social History Narrative  . No narrative on file    Review of Systems: A 10 point ROS was performed. Pertinent items are noted in HPI.  Physical Exam: Blood pressure 141/47, pulse 55, temperature 98.1 F (36.7 C), temperature source Oral, resp. rate 20, weight 133 lb 9.6 oz (60.6 kg), SpO2 96.00%.   Physical Exam  Constitutional:  Chronically ill-appearing, sunken face, no acute distress  HENT:  Mouth/Throat: Mucous membranes are dry.  Eyes: Conjunctivae and EOM are normal. Pupils are equal, round, and reactive to light.  Neck: Normal range of motion. Neck supple.  Cardiovascular: Intact distal pulses.  An irregularly irregular rhythm present. Bradycardia present.   Murmur heard.  Systolic murmur is present with a grade of 2/6  Soft holosystolic murmur best heard at the apex. Pulse rate 40-45. No lower extremity edema.  Pulmonary/Chest: Breath sounds normal. No respiratory distress. She has no wheezes. She has no rales.  Breath sounds normal, but poor effort secondary to mental status.  Abdominal: Soft. Normal appearance, normal aorta and bowel sounds are normal. She exhibits no distension, no abdominal bruit and no mass. There is no hepatosplenomegaly. There is tenderness in the suprapubic area. There is no CVA tenderness.  Musculoskeletal: She exhibits no edema.  Neurological: She has intact cranial nerves. She displays no weakness. She displays no Babinski's sign on the right side. She displays no Babinski's sign on the left  side.  Reflex Scores:      Bicep reflexes are 2+ on the right side and 2+ on the left side. Lethargic. Oriented only to self. Only responding to about half of my instructions during physical exam. Cranial nerves grossly intact, but exam limited by mental status. GCS of 14, losing 1 point for orientation.  Skin: Skin is warm and dry. No cyanosis. Nails show no clubbing.    Lab results: Basic Metabolic Panel:  Recent Labs  81/19/14 1650  NA 143  K 3.2*  CL 106  CO2 26  GLUCOSE 105*  BUN 21  CREATININE 0.70  CALCIUM 8.6  Liver Function Tests:  Recent Labs  01/04/13 1650  AST 17  ALT 10  ALKPHOS 94  BILITOT 1.6*  PROT 6.1  ALBUMIN 2.8*   CBC:  Recent Labs  01/04/13 1650  WBC 9.6  NEUTROABS 7.6  HGB 12.5  HCT 38.1  MCV 93.4  PLT 215   Cardiac Enzymes:  Recent Labs  01/05/13 0007  TROPONINI <0.30   Urinalysis:  Recent Labs  01/04/13 1751  COLORURINE ORANGE*  LABSPEC 1.020  PHURINE 6.0  GLUCOSEU NEGATIVE  HGBUR NEGATIVE  BILIRUBINUR MODERATE*  KETONESUR 40*  PROTEINUR 30*  UROBILINOGEN 2.0*  NITRITE NEGATIVE  LEUKOCYTESUR MODERATE*   Misc. Labs: Digoxin Level  Date Value Range Status  01/04/2013 2.5* 0.8 - 2.0 ng/mL Final   Lactic Acid, Venous  Date Value Range Status  01/04/2013 1.5  0.5 - 2.2 mmol/L Final      Imaging results:  Dg Chest 2 View  01/04/2013   *RADIOLOGY REPORT*  Clinical Data: Dehydration.  CHEST - 2 VIEW  Comparison: None.  Findings: The heart is borderline enlarged.  The mediastinal and hilar contours are within normal limits.  A probable chronic bronchitic type interstitial lung changes but no definite acute infiltrates or pleural effusion.  Low lung volumes with vascular crowding and streaky atelectasis.  The bony thorax is intact.  IMPRESSION: Mild cardiac enlargement. Low lung volumes with vascular crowding and atelectasis. Probable underlying chronic bronchitic type lung changes.   Original Report Authenticated By: Rudie Meyer, M.D.    Other results: EKG: no previous EKG for comparison. Atrial fibrillation, rate 46. Nonspecific T wave inversions. No U-waves. No PVCs.  Assessment & Plan by Problem: Ashley Rubio is a 77 year old female with past medical history of atrial fibrillation on Coumadin, digoxin and metoprolol; hypertension; and history of urinary tract infection with altered mental status leading to a recent hospitalization 2.5 weeks ago, who presents today with altered mental status, suprapubic tenderness on exam, and UA positive for many bacteria.  1. Altered mental status - Most likely secondary to urinary tract infection, UA positive for many bacteria. Patient was recently hospitalized with similar presentation per the family. No septic shock at this time; She is afebrile, Blood pressures 130s-150s/40s-60s, Lactate within normal limits. Also on the differential is digoxin toxicity. Her level is 2.5, with normal range 0.8- 2.0, and she exhibits bradycardia. No hyperkalemia. BUN and creatinine within normal limits. Dry and sticky mucous membranes on exam, so dehydration secondary to decreased po intake may also be contributing. - Admit to IMTS, telemetry - Holding digoxin overnight secondary to abnormally high level - Normal saline bolus, infusion - blood cultures pending - Keep NPO, ice chips as needed  2. UTI - Urinalysis is significant for many bacteria. Her physical exam is notable for suprapubic tenderness. Similar presentation 2.5 weeks ago per the family. - IV Levaquin - Urine culture pending  3. Afib - Continue coumadin - PT/INR in am  4. Bradycardia - Pulse rate in the 40s. Possibly secondary to digoxin toxicity. - Holding digoxin and metoprolol  5. HTN - continue lisinopril  6. COPD - continue Symbicort inhaler  7. Hypokalemia - K=2.3 - intravenous potassium chloride, total 30 mg - Normal saline infusion containing 20 mEq of KCl  8. Heart murmer - Soft holosystolic murmur  best heard at the apex. History of mitral regurgitation per PCP note brought by patient's son. Afebrile. - followup blood cultures  7. DVT PPX - subcutaneous heparin - SCDs   Dispo: Disposition is  deferred at this time, awaiting improvement of current medical problems. Anticipated discharge in approximately 1-3 day(s).   The patient does have a current PCP (S Gillermo Murdoch, MD) and does need an [facility] hospital follow-up appointment after discharge.  The patient does not have transportation limitations that hinder transportation to clinic appointments.  Signed: Vivi Barrack, MD 01/05/2013, 1:22 AM

## 2013-01-05 DIAGNOSIS — I498 Other specified cardiac arrhythmias: Secondary | ICD-10-CM

## 2013-01-05 DIAGNOSIS — T460X5A Adverse effect of cardiac-stimulant glycosides and drugs of similar action, initial encounter: Secondary | ICD-10-CM

## 2013-01-05 DIAGNOSIS — N390 Urinary tract infection, site not specified: Principal | ICD-10-CM

## 2013-01-05 DIAGNOSIS — R4182 Altered mental status, unspecified: Secondary | ICD-10-CM

## 2013-01-05 DIAGNOSIS — I4891 Unspecified atrial fibrillation: Secondary | ICD-10-CM

## 2013-01-05 DIAGNOSIS — T460X1A Poisoning by cardiac-stimulant glycosides and drugs of similar action, accidental (unintentional), initial encounter: Secondary | ICD-10-CM

## 2013-01-05 LAB — CBC
Hemoglobin: 12.3 g/dL (ref 12.0–15.0)
MCHC: 32.3 g/dL (ref 30.0–36.0)
WBC: 8 10*3/uL (ref 4.0–10.5)

## 2013-01-05 LAB — URINE CULTURE: Colony Count: >=100000

## 2013-01-05 LAB — BASIC METABOLIC PANEL
BUN: 19 mg/dL (ref 6–23)
Chloride: 108 mEq/L (ref 96–112)
GFR calc Af Amer: >90 mL/min (ref >90)
GFR calc non Af Amer: 85 mL/min — ABNORMAL LOW (ref >90)
Potassium: 3.7 mEq/L (ref 3.5–5.1)
Sodium: 142 mEq/L (ref 135–145)

## 2013-01-05 LAB — TROPONIN I: Troponin I: <0.3 ng/mL (ref <0.30)

## 2013-01-05 LAB — PROTIME-INR
INR: 1.44 (ref 0.00–1.49)
Prothrombin Time: 17.2 seconds — ABNORMAL HIGH (ref 11.6–15.2)

## 2013-01-05 MED ORDER — WARFARIN SODIUM 5 MG PO TABS
5.0000 mg | ORAL_TABLET | Freq: Once | ORAL | Status: AC
  Administered 2013-01-05: 5 mg via ORAL
  Filled 2013-01-05: qty 1

## 2013-01-05 MED ORDER — ENOXAPARIN SODIUM 100 MG/ML ~~LOC~~ SOLN
90.0000 mg | SUBCUTANEOUS | Status: DC
  Administered 2013-01-05 – 2013-01-07 (×3): 90 mg via SUBCUTANEOUS
  Filled 2013-01-05 (×4): qty 1

## 2013-01-05 NOTE — Progress Notes (Signed)
ANTICOAGULATION CONSULT NOTE - Follow Up Consult  Pharmacy Consult for warfarin and Lovenox Indication: atrial fibrillation  Allergies  Allergen Reactions  . Ambien (Zolpidem Tartrate)     tachycardia    . Codeine Itching  . Penicillins Swelling  . Tartrazine Nausea Only    Patient Measurements: Height: 5\' 6"  (167.6 cm) Weight: 133 lb 9.6 oz (60.6 kg) IBW/kg (Calculated) : 59.3   Vital Signs: Temp: 98.8 F (37.1 C) (08/02 0852) Temp src: Axillary (08/02 0852) BP: 127/56 mmHg (08/02 0852) Pulse Rate: 55 (08/02 0852)  Labs:  Recent Labs  01/04/13 1650 01/05/13 0007 01/05/13 0505  HGB 12.5  --  12.3  HCT 38.1  --  38.1  PLT 215  --  192  LABPROT  --   --  17.2*  INR  --   --  1.44  CREATININE 0.70  --  0.65  TROPONINI  --  <0.30  --     Estimated Creatinine Clearance: 56.9 ml/min (by C-G formula based on Cr of 0.65).   Assessment: Ashley Rubio on chronic warfarin for AFib (home dose 2mg  daily) admitted with AMS and UTI being treated with levofloxacin which can potentiate INR. Last dose of warfarin was last evening per her son. INR this morning is SUBtherapeutic at 1.44. Is now also to be bridged with Lovenox. CBC is all WNL and no bleeding noted.  Goal of Therapy:  INR 2-3 Monitor platelets by anticoagulation protocol: Yes   Plan:  1. Warfarin 5mg  po x1 tonight 2. Daily PT/INR 3. Lovenox 90mg  subq Q24hr 4. Q72 CBC 5. F/u signs and symptoms of bleeding  Sira Adsit D. Caliah Kopke, PharmD Clinical Pharmacist Pager: [phone] 01/05/2013 12:33 PM

## 2013-01-05 NOTE — ED Provider Notes (Signed)
I saw and evaluated the patient, reviewed the resident's note and I agree with the findings and plan. The patient presents with complaints of weakness, decreased apetite, lethargy, and dehydration.  She has an extensive pmh and recently was hospitalized for uti.  She appears less active and responsive than normal.  On exam, the patient appears pale, weak and debilitated.  The vitals are stable and she is afebrile.  The heart is regular rate and rhythm and the lungs are clear.  The abdomen is soft, non-tender and the extremities are without edema.  Neurologically, she moves all extremities and is appropriate.    Workup reveals a uti and elevated digoxin level.  She was given fluids and antibiotics.  Medicine will be consulted for admission.     Date: 01/05/2013  Rate: 46  Rhythm: atrial fibrillation  QRS Axis: left  Intervals: normal  ST/T Wave abnormalities: nonspecific T wave changes  Conduction Disutrbances:none  Narrative Interpretation:   Old EKG Reviewed: changes noted, slower rate    Geoffery Lyons, MD 01/05/13 [phone]

## 2013-01-05 NOTE — H&P (Addendum)
Date: 01/05/2013  Patient name: Ashley Rubio  Medical record number: [id number]  Date of birth: 1935/11/03   I have seen and evaluated Burton Apley and discussed their care with the Residency Team. This is a 77yo F with afbi who previously was independently living on her own until June where she has had significant decline in her health, has had 2 hospitalizations at [facility] one for UTI, she was recently placed on digoxin as well as haldol by SNF when she was discharged to home. Now presents with lethargy, altered mental status, suprapubic pain. AMS likely due to several factors including infection +/- drug side effects from digoxin and haldol.  Physical Exam: Blood pressure 127/56, pulse 55, temperature 98.8 F (37.1 C), temperature source Axillary, resp. rate 18, weight 133 lb 9.6 oz (60.6 kg), SpO2 91.00%.  Constitutional:  Chronically ill-appearing,  no acute distress  HENT:  Mouth/Throat: Mucous membranes are dry.  Eyes: Conjunctivae and EOM are normal. Pupils are equal, round, and reactive to light.  Neck: Normal range of motion. Neck supple.  Cardiovascular: Intact distal pulses. An irregularly irregular rhythm present. Bradycardia present.  Murmur heard.  Systolic murmur is present with a grade of 2/6  Soft holosystolic murmur best heard at the apex. Pulse rate 40-45. No lower extremity edema.  Pulmonary/Chest: Breath sounds normal. No respiratory distress. She has no wheezes. She has no rales.  Breath sounds normal, but poor effort secondary to mental status.  Abdominal: Soft. Normal appearance, normal aorta and bowel sounds are normal. She exhibits no distension, no abdominal bruit and no mass. There is no hepatosplenomegaly. There is tenderness in the suprapubic area. There is no CVA tenderness.  Musculoskeletal: She exhibits no edema.  Neurological: She has intact cranial nerves. She displays no weakness. She displays no Babinski's sign on the right side. She displays no  Babinski's sign on the left side.  Reflex Scores:  Bicep reflexes are 2+ on the right side and 2+ on the left side. Skin: Skin is warm and dry. No cyanosis. Nails show no clubbing.   Lab results: Results for orders placed during the hospital encounter of 01/04/13 (from the past 24 hour(s))  DIGOXIN LEVEL     Status: Abnormal   Collection Time    01/04/13  4:50 PM      Result Value Range   Digoxin Level 2.5 (*) 0.8 - 2.0 ng/mL  CBC WITH DIFFERENTIAL     Status: Abnormal   Collection Time    01/04/13  4:50 PM      Result Value Range   WBC 9.6  4.0 - 10.5 K/uL   RBC 4.08  3.87 - 5.11 MIL/uL   Hemoglobin 12.5  12.0 - 15.0 g/dL   HCT 08.6  57.8 - 46.9 %   MCV 93.4  78.0 - 100.0 fL   MCH 30.6  26.0 - 34.0 pg   MCHC 32.8  30.0 - 36.0 g/dL   RDW 62.9  52.8 - 41.3 %   Platelets 215  150 - 400 K/uL   Neutrophils Relative % 79 (*) 43 - 77 %   Neutro Abs 7.6  1.7 - 7.7 K/uL   Lymphocytes Relative 10 (*) 12 - 46 %   Lymphs Abs 0.9  0.7 - 4.0 K/uL   Monocytes Relative 11  3 - 12 %   Monocytes Absolute 1.1 (*) 0.1 - 1.0 K/uL   Eosinophils Relative 0  0 - 5 %   Eosinophils Absolute 0.0  0.0 - 0.7 K/uL  Basophils Relative 0  0 - 1 %   Basophils Absolute 0.0  0.0 - 0.1 K/uL  COMPREHENSIVE METABOLIC PANEL     Status: Abnormal   Collection Time    01/04/13  4:50 PM      Result Value Range   Sodium 143  135 - 145 mEq/L   Potassium 3.2 (*) 3.5 - 5.1 mEq/L   Chloride 106  96 - 112 mEq/L   CO2 26  19 - 32 mEq/L   Glucose, Bld 105 (*) 70 - 99 mg/dL   BUN 21  6 - 23 mg/dL   Creatinine, Ser 1.61  0.50 - 1.10 mg/dL   Calcium 8.6  8.4 - 09.6 mg/dL   Total Protein 6.1  6.0 - 8.3 g/dL   Albumin 2.8 (*) 3.5 - 5.2 g/dL   AST 17  0 - 37 U/L   ALT 10  0 - 35 U/L   Alkaline Phosphatase 94  39 - 117 U/L   Total Bilirubin 1.6 (*) 0.3 - 1.2 mg/dL   GFR calc non Af Amer 83 (*) >90 mL/min   GFR calc Af Amer >90  >90 mL/min  URINALYSIS, ROUTINE W REFLEX MICROSCOPIC     Status: Abnormal   Collection  Time    01/04/13  5:51 PM      Result Value Range   Color, Urine ORANGE (*) YELLOW   APPearance CLOUDY (*) CLEAR   Specific Gravity, Urine 1.020  1.005 - 1.030   pH 6.0  5.0 - 8.0   Glucose, UA NEGATIVE  NEGATIVE mg/dL   Hgb urine dipstick NEGATIVE  NEGATIVE   Bilirubin Urine MODERATE (*) NEGATIVE   Ketones, ur 40 (*) NEGATIVE mg/dL   Protein, ur 30 (*) NEGATIVE mg/dL   Urobilinogen, UA 2.0 (*) 0.0 - 1.0 mg/dL   Nitrite NEGATIVE  NEGATIVE   Leukocytes, UA MODERATE (*) NEGATIVE  URINE MICROSCOPIC-ADD ON     Status: Abnormal   Collection Time    01/04/13  5:51 PM      Result Value Range   Squamous Epithelial / LPF RARE  RARE   WBC, UA 11-20  <3 WBC/hpf   RBC / HPF 0-2  <3 RBC/hpf   Bacteria, UA MANY (*) RARE   Casts GRANULAR CAST (*) NEGATIVE   Urine-Other MUCOUS PRESENT    LACTIC ACID, PLASMA     Status: None   Collection Time    01/04/13  6:20 PM      Result Value Range   Lactic Acid, Venous 1.5  0.5 - 2.2 mmol/L  TROPONIN I     Status: None   Collection Time    01/05/13 12:07 AM      Result Value Range   Troponin I <0.30  <0.30 ng/mL  BASIC METABOLIC PANEL     Status: Abnormal   Collection Time    01/05/13  5:05 AM      Result Value Range   Sodium 142  135 - 145 mEq/L   Potassium 3.7  3.5 - 5.1 mEq/L   Chloride 108  96 - 112 mEq/L   CO2 25  19 - 32 mEq/L   Glucose, Bld 85  70 - 99 mg/dL   BUN 19  6 - 23 mg/dL   Creatinine, Ser 0.45  0.50 - 1.10 mg/dL   Calcium 8.1 (*) 8.4 - 10.5 mg/dL   GFR calc non Af Amer 85 (*) >90 mL/min   GFR calc Af Amer >90  >90 mL/min  CBC     Status: None   Collection Time    01/05/13  5:05 AM      Result Value Range   WBC 8.0  4.0 - 10.5 K/uL   RBC 4.05  3.87 - 5.11 MIL/uL   Hemoglobin 12.3  12.0 - 15.0 g/dL   HCT 96.0  45.4 - 09.8 %   MCV 94.1  78.0 - 100.0 fL   MCH 30.4  26.0 - 34.0 pg   MCHC 32.3  30.0 - 36.0 g/dL   RDW 11.9  14.7 - 82.9 %   Platelets 192  150 - 400 K/uL  PROTIME-INR     Status: Abnormal   Collection Time      01/05/13  5:05 AM      Result Value Range   Prothrombin Time 17.2 (*) 11.6 - 15.2 seconds   INR 1.44  0.00 - 1.49    Imaging results:  Dg Chest 2 View  01/04/2013   *RADIOLOGY REPORT*  Clinical Data: Dehydration.  CHEST - 2 VIEW  Comparison: None.  Findings: The heart is borderline enlarged.  The mediastinal and hilar contours are within normal limits.  A probable chronic bronchitic type interstitial lung changes but no definite acute infiltrates or pleural effusion.  Low lung volumes with vascular crowding and streaky atelectasis.  The bony thorax is intact.  IMPRESSION: Mild cardiac enlargement. Low lung volumes with vascular crowding and atelectasis. Probable underlying chronic bronchitic type lung changes.   Original Report Authenticated By: Rudie Meyer, M.D.    Assessment and Plan: I have seen and evaluated the patient as outlined above. I agree with the formulated Assessment and Plan as detailed in the residents' admission note, with the following changes:   Delirium/encephalopathy = she is admitted in order to determine the multifactorial etiology of her encephalopathy which has worsened her baseline dementia. She has an abnormal ua which is concerning for UTI thus she has been empirically started on antibiotics. Secondly, her supra-therapeutic digoxin level can also attribute to medication related delirium, which we have discontinued.   Atrial fibrillation = will continue to monitor her on telemetry and will add beta-blocker and titrate to goal.   Judyann Munson, MD 8/2/201411:51 AM

## 2013-01-05 NOTE — H&P (Signed)
Agree with history, assessment and plan as described by the resident. Please see attending note's for further recommendations.   Duke Salvia Drue Second MD MPH [facility] [phone]

## 2013-01-05 NOTE — Progress Notes (Signed)
Subjective: Ashley Rubio was seen and examined by the team this AM.  Her son was present for the exam.  He says she has not been acting like her usual self for the past month.  She denies H/A, fever/dysuria.    Objective: Vital signs in last 24 hours: Filed Vitals:   01/05/13 0454 01/05/13 0852 01/05/13 1100 01/05/13 1349  BP: 152/65 127/56  164/78  Pulse: 87 55  68  Temp: 97.6 F (36.4 C) 98.8 F (37.1 C)  98.8 F (37.1 C)  TempSrc: Oral Axillary    Resp: 20 18  19   Height:   5\' 6"  (1.676 m)   Weight:      SpO2: 99% 91%  95%   Weight change:   Intake/Output Summary (Last 24 hours) at 01/05/13 1547 Last data filed at 01/05/13 1350  Gross per 24 hour  Intake 776.67 ml  Output    550 ml  Net 226.67 ml   General: resting in bed in NAD HEENT: mucous membranes dry Cardiac: RRR, no rubs, murmurs or gallops Pulm: clear to auscultation bilaterally, moving normal volumes of air Abd: soft, nontender, nondistended, no CVAT Ext: warm and well perfused, no pedal edema Neuro: alert and oriented to self, place, knows day of week but not the year  Lab Results: Basic Metabolic Panel:  Recent Labs Lab 01/04/13 1650 01/05/13 0505  NA 143 142  K 3.2* 3.7  CL 106 108  CO2 26 25  GLUCOSE 105* 85  BUN 21 19  CREATININE 0.70 0.65  CALCIUM 8.6 8.1*   Liver Function Tests:  Recent Labs Lab 01/04/13 1650  AST 17  ALT 10  ALKPHOS 94  BILITOT 1.6*  PROT 6.1  ALBUMIN 2.8*   No results found for this basename: LIPASE, AMYLASE,  in the last 168 hours No results found for this basename: AMMONIA,  in the last 168 hours CBC:  Recent Labs Lab 01/04/13 1650 01/05/13 0505  WBC 9.6 8.0  NEUTROABS 7.6  --   HGB 12.5 12.3  HCT 38.1 38.1  MCV 93.4 94.1  PLT 215 192   Cardiac Enzymes:  Recent Labs Lab 01/05/13 0007  TROPONINI <0.30   BNP: No results found for this basename: PROBNP,  in the last 168 hours D-Dimer: No results found for this basename: DDIMER,  in the  last 168 hours CBG: No results found for this basename: GLUCAP,  in the last 168 hours Hemoglobin A1C: No results found for this basename: HGBA1C,  in the last 168 hours Fasting Lipid Panel: No results found for this basename: CHOL, HDL, LDLCALC, TRIG, CHOLHDL, LDLDIRECT,  in the last 168 hours Thyroid Function Tests: No results found for this basename: TSH, T4TOTAL, FREET4, T3FREE, THYROIDAB,  in the last 168 hours Coagulation:  Recent Labs Lab 01/05/13 0505  LABPROT 17.2*  INR 1.44   Anemia Panel: No results found for this basename: VITAMINB12, FOLATE, FERRITIN, TIBC, IRON, RETICCTPCT,  in the last 168 hours Urine Drug Screen: Drugs of Abuse  No results found for this basename: labopia, cocainscrnur, labbenz, amphetmu, thcu, labbarb    Alcohol Level: No results found for this basename: ETH,  in the last 168 hours Urinalysis:  Recent Labs Lab 01/04/13 1751  COLORURINE ORANGE*  LABSPEC 1.020  PHURINE 6.0  GLUCOSEU NEGATIVE  HGBUR NEGATIVE  BILIRUBINUR MODERATE*  KETONESUR 40*  PROTEINUR 30*  UROBILINOGEN 2.0*  NITRITE NEGATIVE  LEUKOCYTESUR MODERATE*    Micro Results: Recent Results (from the past 240 hour(s))  CULTURE, BLOOD (ROUTINE X 2)     Status: None   Collection Time    01/04/13  6:20 PM      Result Value Range Status   Specimen Description BLOOD ARM LEFT   Final   Special Requests BOTTLES DRAWN AEROBIC ONLY 5CC   Final   Culture  Setup Time 01/04/2013 22:23   Final   Culture     Final   Value:        BLOOD CULTURE RECEIVED NO GROWTH TO DATE CULTURE WILL BE HELD FOR 5 DAYS BEFORE ISSUING A FINAL NEGATIVE REPORT   Report Status PENDING   Incomplete   Studies/Results: Dg Chest 2 View  01/04/2013   *RADIOLOGY REPORT*  Clinical Data: Dehydration.  CHEST - 2 VIEW  Comparison: None.  Findings: The heart is borderline enlarged.  The mediastinal and hilar contours are within normal limits.  A probable chronic bronchitic type interstitial lung changes but no  definite acute infiltrates or pleural effusion.  Low lung volumes with vascular crowding and streaky atelectasis.  The bony thorax is intact.  IMPRESSION: Mild cardiac enlargement. Low lung volumes with vascular crowding and atelectasis. Probable underlying chronic bronchitic type lung changes.   Original Report Authenticated By: Rudie Meyer, M.D.   Medications: I have reviewed the patient's current medications. Scheduled Meds: . budesonide-formoterol  2 puff Inhalation BID  . donepezil  5 mg Oral QHS  . enoxaparin (LOVENOX) injection  90 mg Subcutaneous Q24H  . levofloxacin (LEVAQUIN) IV  250 mg Intravenous Q24H  . lisinopril  10 mg Oral Daily  . sodium chloride  3 mL Intravenous Q12H  . warfarin  5 mg Oral ONCE-1800  . Warfarin - Pharmacist Dosing Inpatient   Does not apply q1800   Continuous Infusions: . 0.9 % NaCl with KCl 20 mEq / L 100 mL/hr at 01/04/13 2214   PRN Meds:. Assessment/Plan: Ashley Rubio is a 77 year old female with past medical history of Afib (on Coumadin, digoxin and metoprolol) HTN and hx of UTI with altered mental status leading to a recent hospitalization 2.5 weeks ago admitted for AMS likely due to UTI.   1. Altered mental status - Likely multifactorial secondary to UTI as +UA.  Also, pt was recently hospitalized for UTI with similar presentation per the family.  After her hospitalization she stayed at a SNF for a few weeks and was started on Digoxin and Haldol.  She has been back home with her son for the past week.  She appears dry so dehydration secondary to decreased po intake may also be contributing.  - Holding digoxin secondary to abnormally high level - Will obtain medication records from recent hospitalization and SNF stay - Normal saline bolus, infusion  - blood cultures pending  - Keep NPO, ice chips as needed   2. UTI - Urinalysis is significant for many bacteria. Her physical exam is notable for suprapubic tenderness. Similar presentation 2.5 weeks  ago per the family.  - IV Levaquin  - Urine culture pending   3. Afib  - Continue coumadin  - Levaquin  - INR 1.4 subtherapeutic, pharmacy rec to add Lovenox, recs apppreciated  4. Bradycardia - Possibly secondary to digoxin toxicity.  - Holding digoxin and metoprolol   5. HTN  - continue lisinopril   6. COPD  - continue Symbicort inhaler   7. Hypokalemia - resolved, K 3.7 today  8. Heart murmer - Soft holosystolic murmur best heard at the apex. History of mitral regurgitation per PCP note  brought by patient's son. Afebrile.  - followup blood cultures   7. DVT PPX  - Lovenox  - SCDs   Dispo: Disposition is deferred at this time, awaiting improvement of current medical problems.  Anticipated discharge in approximately 1-2 day(s).   The patient does have a current PCP (S Gillermo Murdoch, MD) and does need an [facility] hospital follow-up appointment after discharge.  The patient does not have transportation limitations that hinder transportation to clinic appointments.  .Services Needed at time of discharge: Y = Yes, Blank = No PT:   OT:   RN:   Equipment:   Other:     LOS: 1 day   Evelena Peat, DO 01/05/2013, 3:47 PM

## 2013-01-06 LAB — CBC
MCH: 30.6 pg (ref 26.0–34.0)
MCHC: 32.6 g/dL (ref 30.0–36.0)
Platelets: 192 10*3/uL (ref 150–400)
RBC: 3.63 MIL/uL — ABNORMAL LOW (ref 3.87–5.11)

## 2013-01-06 LAB — CBC WITH DIFFERENTIAL/PLATELET
Eosinophils Absolute: 0 10*3/uL (ref 0.0–0.7)
Eosinophils Relative: 0 % (ref 0–5)
HCT: 34 % — ABNORMAL LOW (ref 36.0–46.0)
Hemoglobin: 11.1 g/dL — ABNORMAL LOW (ref 12.0–15.0)
Lymphocytes Relative: 13 % (ref 12–46)
Lymphs Abs: 1.1 10*3/uL (ref 0.7–4.0)
MCH: 30.7 pg (ref 26.0–34.0)
MCV: 93.9 fL (ref 78.0–100.0)
Monocytes Absolute: 0.7 10*3/uL (ref 0.1–1.0)
Monocytes Relative: 9 % (ref 3–12)
Platelets: 196 10*3/uL (ref 150–400)
RBC: 3.62 MIL/uL — ABNORMAL LOW (ref 3.87–5.11)

## 2013-01-06 LAB — PROTIME-INR
INR: 1.7 — ABNORMAL HIGH (ref 0.00–1.49)
Prothrombin Time: 19.5 seconds — ABNORMAL HIGH (ref 11.6–15.2)

## 2013-01-06 LAB — BASIC METABOLIC PANEL
CO2: 23 mEq/L (ref 19–32)
Calcium: 7.8 mg/dL — ABNORMAL LOW (ref 8.4–10.5)
Chloride: 111 mEq/L (ref 96–112)
Glucose, Bld: 86 mg/dL (ref 70–99)
Sodium: 144 mEq/L (ref 135–145)

## 2013-01-06 MED ORDER — LEVOFLOXACIN 500 MG PO TABS
500.0000 mg | ORAL_TABLET | ORAL | Status: DC
  Administered 2013-01-07: 500 mg via ORAL
  Filled 2013-01-06 (×2): qty 1

## 2013-01-06 MED ORDER — LEVOFLOXACIN 250 MG PO TABS: 250.0000 mg | ORAL_TABLET | Freq: Every day | ORAL | Status: DC

## 2013-01-06 MED ORDER — WARFARIN SODIUM 2.5 MG PO TABS
2.5000 mg | ORAL_TABLET | Freq: Once | ORAL | Status: AC
  Administered 2013-01-06: 2.5 mg via ORAL
  Filled 2013-01-06: qty 1

## 2013-01-06 NOTE — Evaluation (Signed)
Physical Therapy Evaluation Patient Details Name: Ashley Rubio MRN: [id number] DOB: 04-Feb-1936 Today's Date: 01/06/2013 Time: 4098-1191 PT Time Calculation (min): 27 min  PT Assessment / Plan / Recommendation History of Present Illness  [name] is a 77 year old female with past medical history of Afib (on Coumadin, digoxin and metoprolol) HTN and hx of UTI with altered mental status leading to a recent hospitalization 2.5 weeks ago admitted for AMS likely due to UTI.   Clinical Impression  Pt admitted with above. Pt currently with functional limitations due to the deficits listed below (see PT Problem List).  Pt will benefit from skilled PT to increase their independence and safety with mobility to allow discharge to the venue listed below with assistance of son.      PT Assessment  Patient needs continued PT services    Follow Up Recommendations  Home health PT;Supervision/Assistance - 24 hour (family plans to take home and provide 24 hour assist. Family does not want SNF)    Does the patient have the potential to tolerate intense rehabilitation      Barriers to Discharge        Equipment Recommendations  Wheelchair (measurements PT)    Recommendations for Other Services     Frequency Min 3X/week    Precautions / Restrictions Precautions Precautions: Fall   Pertinent Vitals/Pain No signs of pain.      Mobility  Bed Mobility Bed Mobility: Supine to Sit;Sitting - Scoot to [place] of Bed;Sit to Supine;Scooting to [facility] Supine to Sit: 2: Max assist;HOB elevated Sitting - Scoot to [place] of Bed: 2: Max assist Sit to Supine: 2: Max assist;HOB flat Scooting to HOB: 1: +1 Total assist Details for Bed Mobility Assistance: Assist to bring trunk up and hips to EOB. Assist to lower trunk and bring legs back up onto bed. Transfers Transfers: Sit to Stand;Stand to Sit Details for Transfer Assistance: Attempted but unable with 1 person assist.    Exercises     PT Diagnosis:  Difficulty walking;Generalized weakness;Altered mental status  PT Problem List: Decreased strength;Decreased activity tolerance;Decreased balance;Decreased mobility;Decreased cognition;Decreased knowledge of use of DME;Decreased safety awareness PT Treatment Interventions: DME instruction;Gait training;Functional mobility training;Therapeutic activities;Therapeutic exercise;Balance training;Patient/family education     PT Goals(Current goals can be found in the care plan section) Acute Rehab PT Goals Patient Stated Goal: Daughter wants pt to return to her son's home. PT Goal Formulation: With patient/family Time For Goal Achievement: 01/13/13 Potential to Achieve Goals: Fair  Visit Information  Last PT Received On: 01/06/13 Assistance Needed: +2 History of Present Illness: [name] is a 77 year old female with past medical history of Afib (on Coumadin, digoxin and metoprolol) HTN and hx of UTI with altered mental status leading to a recent hospitalization 2.5 weeks ago admitted for AMS likely due to UTI.        Prior Functioning  Home Living Family/patient expects to be discharged to:: Private residence Living Arrangements: Children (Has been living with son since dc from SNF.) Available Help at Discharge: Family Type of Home: House Home Access: Stairs to enter Home Layout: One level Home Equipment: Environmental consultant - 2 wheels;Bedside commode;Shower seat Prior Function Level of Independence: Needs assistance Gait / Transfers Assistance Needed: Was amb for 1-2 days at son's home before declining again and son was having to carry her.    Cognition  Cognition Arousal/Alertness: Awake/alert Behavior During Therapy: WFL for tasks assessed/performed Overall Cognitive Status: Impaired/Different from baseline Area of Impairment: Orientation;Following commands;Memory;Problem solving Orientation Level: Disoriented to;Place;Time;Situation Memory:  Decreased short-term memory Following Commands:  Follows one step commands consistently Problem Solving: Slow processing;Decreased initiation;Requires verbal cues;Requires tactile cues    Extremity/Trunk Assessment Lower Extremity Assessment Lower Extremity Assessment: RLE deficits/detail;LLE deficits/detail RLE Deficits / Details: grossly 2+/5 LLE Deficits / Details: grossly 2+/5   Balance Balance Balance Assessed: Yes Static Sitting Balance Static Sitting - Balance Support: Bilateral upper extremity supported Static Sitting - Level of Assistance: 4: Min assist;3: Mod assist Static Sitting - Comment/# of Minutes: Sat EOB x 7-8 minutes.  Pt with posterior lean with feet coming off of floor. With verbal/tactile cues would lean forward but only for 1-2 seconds.  End of Session PT - End of Session Equipment Utilized During Treatment: Gait belt Activity Tolerance: Patient limited by fatigue Patient left: in bed;with bed alarm set;with family/visitor present;with call bell/phone within reach Nurse Communication: Mobility status  GP     [facility] 01/06/2013, 2:53 PM  [facility] PT [phone]

## 2013-01-06 NOTE — Evaluation (Signed)
Clinical/Bedside Swallow Evaluation Patient Details  Name: Ashley Rubio MRN: [id number] Date of Birth: Jul 24, 1935  Today's Date: 01/06/2013 Time: 1030-1110 SLP Time Calculation (min): 40 min  Past Medical History:  Past Medical History  Diagnosis Date  . Hypertension   . Unspecified venous (peripheral) insufficiency   . Personal history of malignant neoplasm of large intestine   . Cancer     colon  . Varicose veins of lower extremities with other complications    Past Surgical History:  Past Surgical History  Procedure Laterality Date  . Neck surgery  2003  . Tongue biopsy  2003  . Abdominal hysterectomy    . Breast biopsy    . Colonoscopy    . Gastric perforation repair     . Cardioversion    . Leg surgery Left    HPI:    [name] is a 77 year old female with past medical history of atrial fibrillation on Coumadin, digoxin and metoprolol; hypertension; and recent hospitalization for urinary tract infection with altered mental status, who presents today with altered mental status. History obtained from the patient and her son, who lives with her.  For the past day, the patient's son notes she has been lethargic, confused, and disoriented. Also endorses decreased by mouth intake as well as diffuse weakness. She has been unable to get out of bed or walk to the restroom. Denies focal weakness or facial drooping. Per the family she may also be having some abdominal pain. No nausea, vomiting, diarrhea. Two and half weeks ago, the patient had similar symptoms along with dysuria, and was hospitalized for urinary tract infection. After a week long hospital stay, she was discharged to a rehabilitation facility, and then to home 6 days ago. The family notes that her mental status did improve when she came home from the rehabilitation facility, but it never returned to baseline. Prior to the hospitalization, the patient lived alone and was independent in all activities of daily living, though  she did have a diagnosis of mild baseline dementia. No current alcohol use.  The family notes she was also hospitalized about one month ago for increased lower extremity edema, which resolved. It is unknown if she has a history of heart failure. She does have a history of venous insufficiency. Her cardiologist is Dr. Park Breed in [place], [place].  BSE ordered to assess risk for aspiration due to coughing with thin liquids.     Assessment / Plan / Recommendation Clinical Impression  Minimal oral dysphagia marked by poor oral awareness of bolus with oral holding with liquids and solids.  Min to moderate pharyngeal phase dysphagia marked by weakness resulting in delay in initiation with decreased hyoid laryngeal elevation.  Soft signs of penetration with thin liquids s/p swallow.  Strategy of hard throat clear and reswallow effective in clearing vocal quality but patient required max verbal cues to complete.  Diagnostic treatment completed following evaluation focusing on providing caregivers on swallow strategies.   Recommend to initiate dysphagia 2 diet consistency with thin liquids.  Recommend full supervision with all meals  initially to provide necessary cueing to complete swallow strategies to increase safety.  ST to follow in acute care setting for diet tolerance and possible advancement and to monitor closely for aspiration.      Aspiration Risk  Moderate    Diet Recommendation Dysphagia 2 (Fine chop);Thin liquid   Liquid Administration via: Cup;No straw Medication Administration: Whole meds with puree Supervision: Patient able to self feed;Full supervision/cueing for compensatory strategies Compensations:  Slow rate;Small sips/bites;Clear throat intermittently;Effortful swallow Postural Changes and/or Swallow Maneuvers: Seated upright 90 degrees;Upright 30-60 min after meal    Other  Recommendations Oral Care Recommendations: Oral care QID Other Recommendations: Clarify dietary restrictions    Follow Up Recommendations  Skilled Nursing facility    Frequency and Duration min 2x/week  2 weeks       SLP Swallow Goals Patient will consume recommended diet without observed clinical signs of aspiration with: Supervision/safety Patient will utilize recommended strategies during swallow to increase swallowing safety with: Supervision/safety   Swallow Study Prior Functional Status    Lived at     General Date of Onset: 01/04/13 Type of Study: Bedside swallow evaluation Previous Swallow Assessment: [name] is a 77 year old female with past medical history of atrial fibrillation on Coumadin, digoxin and metoprolol; hypertension; and recent hospitalization for urinary tract infection with altered mental status, who presents today with altered mental status. History obtained from the patient and her son, who lives with her. Diet Prior to this Study: NPO;Other (Comment) (NPO except meds/chips) Temperature Spikes Noted: No Respiratory Status: Room air Behavior/Cognition: Alert;Pleasant mood;Requires cueing Oral Cavity - Dentition: Dentures, top;Dentures, bottom Self-Feeding Abilities: Able to feed self;Needs set up Patient Positioning: Upright in bed Baseline Vocal Quality: Clear Volitional Cough: Strong Volitional Swallow: Able to elicit    Oral/Motor/Sensory Function Overall Oral Motor/Sensory Function: Appears within functional limits for tasks assessed   Ice Chips Ice chips: Impaired Presentation: Spoon;Self Fed Pharyngeal Phase Impairments: Suspected delayed Swallow;Decreased hyoid-laryngeal movement   Thin Liquid Thin Liquid: Impaired Presentation: Cup;Spoon Oral Phase Functional Implications: Oral holding Pharyngeal  Phase Impairments: Suspected delayed Swallow;Decreased hyoid-laryngeal movement    Nectar Thick Nectar Thick Liquid: Not tested   Honey Thick Honey Thick Liquid: Not tested   Puree Puree: Within functional limits   Solid   GO    Solid:  Impaired Oral Phase Impairments: Impaired anterior to posterior transit Oral Phase Functional Implications: Oral holding      [name] MS, CCC-SLP [phone] [facility] 01/06/2013,11:21 AM

## 2013-01-06 NOTE — Progress Notes (Signed)
  Date: 01/06/2013  Patient name: Ashley Rubio  Medical record number: [id number]  Date of birth: 07/17/1935   This patient has been seen and the plan of care was discussed with the house staff. Please see their note for complete details. I concur with their findings with the following additions/corrections:  Agree with findings and recommendations as outlined by Dr. Clyde Lundborg. If sensorium is not improved, we will likely recommend to have her undergo LP as part of work up to see if she has infectious causes to contribute to her encephalopathy.  Judyann Munson, MD 01/06/2013, 12:52 PM

## 2013-01-06 NOTE — Progress Notes (Signed)
Subjective:  -Patient is more interactive. She answers questions appropriately, but still not fully oriented.    -She denies H/A, fever -Still has dysuria.   -Swallowing test completed and Dysph 2 diet was suggested.  Objective: Vital signs in last 24 hours: Filed Vitals:   01/06/13 0511 01/06/13 0958 01/06/13 1319 01/06/13 1802  BP: 128/57 132/66 126/64 129/68  Pulse: 83 82 81 83  Temp: 97.6 F (36.4 C) 97.8 F (36.6 C) 98.3 F (36.8 C) 98 F (36.7 C)  TempSrc: Oral Oral Oral Oral  Resp: 18 18 18 18   Height:      Weight:      SpO2: 95% 96% 98% 98%   Weight change: 0 oz (0.001 kg)  Intake/Output Summary (Last 24 hours) at 01/06/13 2023 Last data filed at 01/06/13 1800  Gross per 24 hour  Intake   1650 ml  Output    300 ml  Net   1350 ml   General: resting in bed in NAD HEENT: mucous membranes dry Cardiac: RRR, no rubs, murmurs or gallops Pulm: clear to auscultation bilaterally, moving normal volumes of air Abd: soft, nontender, nondistended, no CVAT Ext: warm and well perfused, no pedal edema Neuro: alert and oriented to self, place, knows day of week but not the year  Lab Results: Basic Metabolic Panel:  Recent Labs Lab 01/05/13 0505 01/06/13 0420  NA 142 144  K 3.7 3.5  CL 108 111  CO2 25 23  GLUCOSE 85 86  BUN 19 17  CREATININE 0.65 0.71  CALCIUM 8.1* 7.8*   Liver Function Tests:  Recent Labs Lab 01/04/13 1650  AST 17  ALT 10  ALKPHOS 94  BILITOT 1.6*  PROT 6.1  ALBUMIN 2.8*   No results found for this basename: LIPASE, AMYLASE,  in the last 168 hours No results found for this basename: AMMONIA,  in the last 168 hours CBC:  Recent Labs Lab 01/04/13 1650  01/06/13 0420 01/06/13 0921  WBC 9.6  < > 7.5 8.1  NEUTROABS 7.6  --   --  6.3  HGB 12.5  < > 11.1* 11.1*  HCT 38.1  < > 34.0* 34.0*  MCV 93.4  < > 93.7 93.9  PLT 215  < > 192 196  < > = values in this interval not displayed. Cardiac Enzymes:  Recent Labs Lab  01/05/13 0007  TROPONINI <0.30   BNP: No results found for this basename: PROBNP,  in the last 168 hours D-Dimer: No results found for this basename: DDIMER,  in the last 168 hours CBG: No results found for this basename: GLUCAP,  in the last 168 hours Hemoglobin A1C: No results found for this basename: HGBA1C,  in the last 168 hours Fasting Lipid Panel: No results found for this basename: CHOL, HDL, LDLCALC, TRIG, CHOLHDL, LDLDIRECT,  in the last 168 hours Thyroid Function Tests: No results found for this basename: TSH, T4TOTAL, FREET4, T3FREE, THYROIDAB,  in the last 168 hours Coagulation:  Recent Labs Lab 01/05/13 0505 01/06/13 0420  LABPROT 17.2* 19.5*  INR 1.44 1.70*   Anemia Panel: No results found for this basename: VITAMINB12, FOLATE, FERRITIN, TIBC, IRON, RETICCTPCT,  in the last 168 hours Urine Drug Screen: Drugs of Abuse  No results found for this basename: labopia,  cocainscrnur,  labbenz,  amphetmu,  thcu,  labbarb    Alcohol Level: No results found for this basename: ETH,  in the last 168 hours Urinalysis:  Recent Labs Lab 01/04/13 1751  COLORURINE  ORANGE*  LABSPEC 1.020  PHURINE 6.0  GLUCOSEU NEGATIVE  HGBUR NEGATIVE  BILIRUBINUR MODERATE*  KETONESUR 40*  PROTEINUR 30*  UROBILINOGEN 2.0*  NITRITE NEGATIVE  LEUKOCYTESUR MODERATE*    Micro Results: Recent Results (from the past 240 hour(s))  URINE CULTURE     Status: None   Collection Time    01/04/13  5:51 PM      Result Value Range Status   Specimen Description URINE, CATHETERIZED   Final   Special Requests NONE   Final   Culture  Setup Time 01/04/2013 22:48   Final   Colony Count >=100,000 COLONIES/ML   Final   Culture     Final   Value: Multiple bacterial morphotypes present, none predominant. Suggest appropriate recollection if clinically indicated.   Report Status 01/05/2013 FINAL   Final  CULTURE, BLOOD (ROUTINE X 2)     Status: None   Collection Time    01/04/13  6:20 PM       Result Value Range Status   Specimen Description BLOOD ARM LEFT   Final   Special Requests BOTTLES DRAWN AEROBIC ONLY 5CC   Final   Culture  Setup Time 01/04/2013 22:23   Final   Culture     Final   Value:        BLOOD CULTURE RECEIVED NO GROWTH TO DATE CULTURE WILL BE HELD FOR 5 DAYS BEFORE ISSUING A FINAL NEGATIVE REPORT   Report Status PENDING   Incomplete  CULTURE, BLOOD (ROUTINE X 2)     Status: None   Collection Time    01/04/13  6:28 PM      Result Value Range Status   Specimen Description BLOOD HAND LEFT   Final   Special Requests BOTTLES DRAWN AEROBIC ONLY 1CC   Final   Culture  Setup Time 01/05/2013 01:32   Final   Culture     Final   Value:        BLOOD CULTURE RECEIVED NO GROWTH TO DATE CULTURE WILL BE HELD FOR 5 DAYS BEFORE ISSUING A FINAL NEGATIVE REPORT   Report Status PENDING   Incomplete   Studies/Results: No results found. Medications: I have reviewed the patient's current medications. Scheduled Meds: . budesonide-formoterol  2 puff Inhalation BID  . donepezil  5 mg Oral QHS  . enoxaparin (LOVENOX) injection  90 mg Subcutaneous Q24H  . [START ON 01/07/2013] levofloxacin  500 mg Oral Q24H  . lisinopril  10 mg Oral Daily  . sodium chloride  3 mL Intravenous Q12H  . Warfarin - Pharmacist Dosing Inpatient   Does not apply q1800   Continuous Infusions: . 0.9 % NaCl with KCl 20 mEq / L 100 mL/hr (01/06/13 1049)   PRN Meds:. Assessment/Plan: [name] is a 77 year old female with past medical history of Afib (on Coumadin, digoxin and metoprolol) HTN and hx of UTI with altered mental status leading to a recent hospitalization 2.5 weeks ago admitted for AMS likely due to UTI.   1. Altered mental status -improving.  Likely multifactorial secondary to UTI as +UA.  Also, pt was recently hospitalized for UTI with similar presentation per the family.  After her hospitalization she stayed at a SNF for a few weeks and was started on Digoxin and Haldol.  She has been back home  with her son for the past week.   - Holding digoxin secondary to abnormally high level - Normal saline infusion  100 cc/h - blood cultures no growth, pending  - start dysph  2 diet    2. UTI - Urinalysis is significant for many bacteria. Her physical exam is notable for suprapubic tenderness. Similar presentation 2.5 weeks ago per the family.  - will switch IV Levaquin to oral - will repeat Urine culture since previous urine culture showed Multiple bacterial morphotypes (lab will not do sensitivity)   3. Afib  - Continue coumadin  - INR 1.7 subtherapeutic, also on Lovenox bridging.  4. Bradycardia - Possibly secondary to digoxin toxicity.  - Holding digoxin and metoprolol   5. HTN  - continue lisinopril   6. COPD  - continue Symbicort inhaler   7. Hypokalemia - resolved, K 3.5 today  8. Heart murmer - Soft holosystolic murmur best heard at the apex. History of mitral regurgitation per PCP note brought by patient's son. Afebrile. Blood culture no growth. - followup blood cultures final report  7. DVT PPX  - Lovenox  - SCDs   Dispo: Disposition is deferred at this time, awaiting improvement of current medical problems.  Anticipated discharge in approximately 1-2 day(s).   The patient does have a current PCP (S Gillermo Murdoch, MD) and does need an [facility] hospital follow-up appointment after discharge.  The patient does not have transportation limitations that hinder transportation to clinic appointments.  .Services Needed at time of discharge: Y = Yes, Blank = No PT:   OT:   RN:   Equipment:   Other:     LOS: 2 days   Lorretta Harp, MD 01/06/2013, 8:23 PM

## 2013-01-06 NOTE — Progress Notes (Signed)
ANTICOAGULATION CONSULT NOTE - Follow Up Consult  Pharmacy Consult for warfarin, Lovenox Indication: atrial fibrillation  Allergies  Allergen Reactions  . Ambien (Zolpidem Tartrate)     tachycardia    . Codeine Itching  . Penicillins Swelling  . Tartrazine Nausea Only    Patient Measurements: Height: 5\' 6"  (167.6 cm) Weight: 133 lb 9.6 oz (60.601 kg) IBW/kg (Calculated) : 59.3   Vital Signs: Temp: 97.8 F (36.6 C) (08/03 0958) Temp src: Oral (08/03 0958) BP: 132/66 mmHg (08/03 0958) Pulse Rate: 82 (08/03 0958)  Labs:  Recent Labs  01/04/13 1650 01/05/13 0007 01/05/13 0505 01/06/13 0420 01/06/13 0921  HGB 12.5  --  12.3 11.1* 11.1*  HCT 38.1  --  38.1 34.0* 34.0*  PLT 215  --  192 192 196  LABPROT  --   --  17.2* 19.5*  --   INR  --   --  1.44 1.70*  --   CREATININE 0.70  --  0.65 0.71  --   TROPONINI  --  <0.30  --   --   --     Estimated Creatinine Clearance: 56.9 ml/min (by C-G formula based on Cr of 0.71).   Medications:  Scheduled:  . budesonide-formoterol  2 puff Inhalation BID  . donepezil  5 mg Oral QHS  . enoxaparin (LOVENOX) injection  90 mg Subcutaneous Q24H  . levofloxacin (LEVAQUIN) IV  250 mg Intravenous Q24H  . lisinopril  10 mg Oral Daily  . sodium chloride  3 mL Intravenous Q12H  . Warfarin - Pharmacist Dosing Inpatient   Does not apply q1800    Assessment: 77 YOF on warfarin (2mg  daily PTA) for AFib being treated for a UTI with Levofloxacin which can potentiate INR. INR was SUBtherapeutic yesterday and she was started on treatment dose Lovenox to bridge until INR was at goal. INR increased nicely to 1.7 this morning, but still SUBtherapeutic and will need to continue Lovenox. H/H dropped slightly, but platelets WNL and no bleeding noted.   Goal of Therapy:  INR 2-3 Anti-Xa level 0.6-1.2 units/ml 4hrs after LMWH dose given Monitor platelets by anticoagulation protocol: Yes   Plan:  1. Warfarin 2.5mg  po x1 tonight 2. Daily  PT/INR 3. Continue Lovenox 90mg  subq Q24hr 4. CBC Q72hr 5. Follow for any signs/symptoms of bleeding  Zubayr Bednarczyk D. Cindel Daugherty, PharmD Clinical Pharmacist Pager: [phone] 01/06/2013 10:53 AM

## 2013-01-07 ENCOUNTER — Inpatient Hospital Stay (HOSPITAL_COMMUNITY): Payer: Medicare Other

## 2013-01-07 DIAGNOSIS — M7989 Other specified soft tissue disorders: Secondary | ICD-10-CM

## 2013-01-07 LAB — BASIC METABOLIC PANEL
Calcium: 8.3 mg/dL — ABNORMAL LOW (ref 8.4–10.5)
Creatinine, Ser: 0.5 mg/dL (ref 0.50–1.10)
GFR calc non Af Amer: >90 mL/min (ref >90)
Sodium: 139 mEq/L (ref 135–145)

## 2013-01-07 LAB — PROTIME-INR
INR: 1.79 — ABNORMAL HIGH (ref 0.00–1.49)
Prothrombin Time: 20.3 seconds — ABNORMAL HIGH (ref 11.6–15.2)

## 2013-01-07 LAB — URINE CULTURE

## 2013-01-07 MED ORDER — METOPROLOL TARTRATE 25 MG PO TABS
25.0000 mg | ORAL_TABLET | Freq: Two times a day (BID) | ORAL | Status: DC
  Administered 2013-01-07 – 2013-01-08 (×3): 25 mg via ORAL
  Filled 2013-01-07 (×5): qty 1

## 2013-01-07 MED ORDER — WARFARIN SODIUM 2.5 MG PO TABS
2.5000 mg | ORAL_TABLET | Freq: Once | ORAL | Status: AC
  Administered 2013-01-07: 2.5 mg via ORAL
  Filled 2013-01-07: qty 1

## 2013-01-07 MED ORDER — QUETIAPINE FUMARATE ER 50 MG PO TB24
50.0000 mg | ORAL_TABLET | Freq: Every day | ORAL | Status: DC
  Filled 2013-01-07: qty 1

## 2013-01-07 NOTE — Progress Notes (Signed)
VASCULAR LAB PRELIMINARY  PRELIMINARY  PRELIMINARY  PRELIMINARY  Right upper extremity venous Doppler completed.    Preliminary report:  There is acute occlusive DVT noted in the right axillary and superficial thrombosis noted in the right basilic vein.  All other veins appear thrombus free. Placed pink band to restrict arm.  Chastity Noland, RVT 01/07/2013, 3:57 PM

## 2013-01-07 NOTE — Clinical Documentation Improvement (Signed)
THIS DOCUMENT IS NOT A PERMANENT PART OF THE MEDICAL RECORD  Please update your documentation with the medical record to reflect your response to this query. If you need help knowing how to do this please call [phone].  01/07/13  Dear Dr. Drue Second Marton Redwood  In an effort to better capture your patient's severity of illness, reflect appropriate length of stay and utilization of resources, a review of the patient medical record has revealed the following indicators.    Based on your clinical judgment, please clarify and document in a progress note and/or discharge summary the clinical condition associated with the following supporting information:  In responding to this query please exercise your independent judgment.  The fact that a query is asked, does not imply that any particular answer is desired or expected.  Possible Clinical Conditions?   _______Encephalopathy (describe type if known)                       Anoxic                       Septic                       Alcoholic                        Hepatic                       Hypertensive                       Metabolic                        Toxic  _______Acute delirium  _______Other Condition_______  _______Cannot Clinically Determine   Supporting Information:  Risk Factors:(As per notes)  "AKI, ESRD, COPD, DEMENTIA"  Signs & Symptoms:(As per notes) "If sensorium is not improved, we will likely recommend to have her undergo LP as part of work up to see if she has infectious causes to contribute to her encephalopathy." "Now presents with lethargy, altered mental status, suprapubic pain. AMS likely due to several factors including infection +/- drug side effects from digoxin and haldol." "Altered mental status - Likely multifactorial secondary to UTI as +UA."  Reviewed: additional documentation in the medical record  Thank You,  Nevin Bloodgood RN, BSN, CCDS  Clinical Documentation Specialist: [phone]  Cell:[phone] Health Information Management 

## 2013-01-07 NOTE — Evaluation (Signed)
Occupational Therapy Evaluation Patient Details Name: Ashley Rubio MRN: [id number] DOB: 04-03-1936 Today's Date: 01/07/2013 Time: 4098-1191 OT Time Calculation (min): 34 min  OT Assessment / Plan / Recommendation History of present illness [name] is a 77 year old female with past medical history of Afib (on Coumadin, digoxin and metoprolol) HTN and hx of UTI with altered mental status leading to a recent hospitalization 2.5 weeks ago admitted for AMS likely due to UTI.    Clinical Impression   Pt presents with the deficits listed below and is very limited with cognition and mobility.  Pt would benefit from cont OT to increase I with basic adls to decrease burden of care on the son who is taking pt home.      OT Assessment  Patient needs continued OT Services    Follow Up Recommendations  SNF;Other (comment);Supervision/Assistance - 24 hour (son wants to take pt home.  HH OT if he does)    Barriers to Discharge Decreased caregiver support son states he will take pt home.  Equipment Recommendations  None recommended by OT    Recommendations for Other Services    Frequency  Min 2X/week    Precautions / Restrictions Precautions Precautions: Fall Restrictions Weight Bearing Restrictions: No   Pertinent Vitals/Pain Pt c/o mild pain in abdomen and chest area only during transfer.  Pt with very high anxiety during transfer.    ADL  Eating/Feeding: Performed;Moderate assistance;Other (comment) (cues for initiation and for swallowing) Where Assessed - Eating/Feeding: Chair Grooming: Performed;Wash/dry hands;Wash/dry face;Teeth care;Moderate assistance Where Assessed - Grooming: Supported sitting Upper Body Bathing: Simulated;Moderate assistance Where Assessed - Upper Body Bathing: Supported sitting Lower Body Bathing: Simulated;Maximal assistance Where Assessed - Lower Body Bathing: Supported sit to stand Upper Body Dressing: Simulated;Moderate assistance Where Assessed -  Upper Body Dressing: Supported sitting Lower Body Dressing: Simulated;+1 Total assistance Where Assessed - Lower Body Dressing: Supported sit to Pharmacist, hospital: Performed;+2 Total assistance Toilet Transfer: Patient Percentage: 10% Statistician Method: Ambulance person: Materials engineer and Hygiene: Simulated;+2 Total assistance Toileting - Architect and Hygiene: Patient Percentage: 10% Where Assessed - Glass blower/designer Manipulation and Hygiene: Standing Equipment Used: Gait belt Transfers/Ambulation Related to ADLs: Pt only transferred to chair.  pt with great fear of falling therefore will not extend knees when standing. ADL Comments: Pt able to do a lot of self feeding and grooming.  pts cogntion limits most of this b/c she is just not interested in the task right now.    OT Diagnosis: Generalized weakness;Cognitive deficits;Acute pain  OT Problem List: Decreased strength;Decreased activity tolerance;Impaired balance (sitting and/or standing);Decreased cognition;Decreased safety awareness;Decreased knowledge of use of DME or AE;Decreased knowledge of precautions;Pain;Increased edema OT Treatment Interventions: Self-care/ADL training;Therapeutic exercise;DME and/or AE instruction;Therapeutic activities;Cognitive remediation/compensation;Patient/family education   OT Goals(Current goals can be found in the care plan section) Acute Rehab OT Goals Patient Stated Goal: Daughter wants pt to return to her son's home. OT Goal Formulation: Patient unable to participate in goal setting Time For Goal Achievement: 01/21/13 Potential to Achieve Goals: Fair ADL Goals Pt Will Perform Eating: with supervision;sitting Pt Will Perform Grooming: with supervision;sitting Pt Will Perform Upper Body Bathing: with supervision;sitting Pt Will Perform Lower Body Dressing: with mod assist;sit to/from stand Pt Will Transfer to Toilet:  with mod assist;bedside commode;stand pivot transfer Pt Will Perform Toileting - Clothing Manipulation and hygiene: with mod assist;sit to/from stand  Visit Information  Last OT Received On: 01/07/13 Assistance Needed: +2 PT/OT Co-Evaluation/Treatment:  Yes History of Present Illness: [name] is a 77 year old female with past medical history of Afib (on Coumadin, digoxin and metoprolol) HTN and hx of UTI with altered mental status leading to a recent hospitalization 2.5 weeks ago admitted for AMS likely due to UTI.        Prior Functioning     Home Living Family/patient expects to be discharged to:: Private residence Living Arrangements: Children Available Help at Discharge: Family Type of Home: House Home Access: Stairs to enter Home Layout: One level Home Equipment: Environmental consultant - 2 wheels;Bedside commode;Shower seat Prior Function Level of Independence: Needs assistance Gait / Transfers Assistance Needed: Was amb for 1-2 days at son's home before declining again and son was having to carry her. ADL's / Homemaking Assistance Needed: Son assisting with most adls. Communication Communication: No difficulties Dominant Hand: Right         Vision/Perception Vision - History Baseline Vision: Wears glasses only for reading Patient Visual Report: No change from baseline Vision - Assessment Eye Alignment: Within Functional Limits Vision Assessment: Vision not tested   Cognition  Cognition Arousal/Alertness: Awake/alert Behavior During Therapy: Flat affect Overall Cognitive Status: Impaired/Different from baseline Area of Impairment: Orientation;Attention;Memory;Following commands;Safety/judgement;Awareness;Problem solving Orientation Level: Disoriented to;Place;Time;Situation Current Attention Level: Focused Memory: Decreased short-term memory Following Commands: Follows one step commands inconsistently;Follows one step commands with increased time Safety/Judgement:  Decreased awareness of safety;Decreased awareness of deficits Problem Solving: Slow processing;Decreased initiation;Difficulty sequencing;Requires verbal cues;Requires tactile cues    Extremity/Trunk Assessment Upper Extremity Assessment Upper Extremity Assessment: Generalized weakness Lower Extremity Assessment Lower Extremity Assessment: Defer to PT evaluation Cervical / Trunk Assessment Cervical / Trunk Assessment: Normal     Mobility Bed Mobility Bed Mobility: Supine to Sit;Sitting - Scoot to Edge of Bed Supine to Sit: 1: +2 Total assist Supine to Sit: Patient Percentage: 10% Sitting - Scoot to Edge of Bed: 1: +2 Total assist Sitting - Scoot to Edge of Bed: Patient Percentage: 0% Details for Bed Mobility Assistance: Max cueing for safe technique and sequencing.  pt difficult to keep on task and to follow directions.   Transfers Transfers: Sit to Stand;Stand to Sit Sit to Stand: 1: +2 Total assist;From bed Sit to Stand: Patient Percentage: 10% Stand to Sit: 1: +2 Total assist;To bed;To chair/3-in-1 Stand to Sit: Patient Percentage: 10% Details for Transfer Assistance: pt fearful and assists PT/OT minimally.  pt remains in crouched position with hip/knees flexed and feet/knees blocked by therapists.       Exercise     Balance Balance Balance Assessed: Yes Static Sitting Balance Static Sitting - Balance Support: Bilateral upper extremity supported;Feet supported Static Sitting - Level of Assistance: 5: Stand by assistance;4: Min assist Static Sitting - Comment/# of Minutes: pt fluctuated between S and MinA to maintain balance pending attention.     End of Session OT - End of Session Activity Tolerance: Patient limited by fatigue Patient left: in chair;with call bell/phone within reach Nurse Communication: Mobility status  GO     Hope Budds 01/07/2013, 9:24 AM [phone]

## 2013-01-07 NOTE — Progress Notes (Signed)
ANTICOAGULATION CONSULT NOTE - Follow Up Consult  Pharmacy Consult for Warfarin and Lovenox Indication: atrial fibrillation  Allergies  Allergen Reactions  . Ambien (Zolpidem Tartrate)     tachycardia    . Codeine Itching  . Penicillins Swelling  . Tartrazine Nausea Only    Patient Measurements: Height: 5\' 6"  (167.6 cm) Weight: 133 lb 9.6 oz (60.601 kg) IBW/kg (Calculated) : 59.3   Vital Signs: Temp: 97.9 F (36.6 C) (08/04 1410) Temp src: Oral (08/04 1410) BP: 131/82 mmHg (08/04 1410) Pulse Rate: 99 (08/04 1410)  Labs:  Recent Labs  01/04/13 1650 01/05/13 0007 01/05/13 0505 01/06/13 0420 01/06/13 0921 01/07/13 0410 01/07/13 1100  HGB 12.5  --  12.3 11.1* 11.1*  --   --   HCT 38.1  --  38.1 34.0* 34.0*  --   --   PLT 215  --  192 192 196  --   --   LABPROT  --   --  17.2* 19.5*  --  20.3*  --   INR  --   --  1.44 1.70*  --  1.79*  --   CREATININE 0.70  --  0.65 0.71  --   --  0.50  TROPONINI  --  <0.30  --   --   --   --   --     Estimated Creatinine Clearance: 56.9 ml/min (by C-G formula based on Cr of 0.5).   Medications:  Scheduled:  . budesonide-formoterol  2 puff Inhalation BID  . donepezil  5 mg Oral QHS  . enoxaparin (LOVENOX) injection  90 mg Subcutaneous Q24H  . levofloxacin  500 mg Oral Q24H  . lisinopril  10 mg Oral Daily  . metoprolol tartrate  25 mg Oral BID  . sodium chloride  3 mL Intravenous Q12H  . Warfarin - Pharmacist Dosing Inpatient   Does not apply q1800    Assessment: 77 YO F on warfarin (2mg  daily PTA) for AFib admitted with AMS thought 2/2 UTI.  INR was SUBtherapeutic on admission but trending up.  She was started on treatment dose Lovenox to bridge until INR was at goal. Pt continues on Levaquin for UTI which can increase INR.  Will monitor closely.  Goal of Therapy:  INR 2-3 Anti-Xa level 0.6-1.2 units/ml 4hrs after LMWH dose given Monitor platelets by anticoagulation protocol: Yes   Plan:  1. Warfarin 2.5mg  po x1  tonight 2. Daily PT/INR 3. Continue Lovenox 90mg  subq Q24hr 4. CBC Q72hr 5. Follow for any signs/symptoms of bleeding  [employer], Pharm.D., BCPS Clinical Pharmacist Pager [phone] 01/07/2013 3:25 PM

## 2013-01-07 NOTE — Progress Notes (Signed)
Pt with episode of HR 152, pt is asymptomatic, HR in afib, sitting in chair. VSS. MD paged, will continue to monitor.

## 2013-01-07 NOTE — Progress Notes (Signed)
Subjective: Ashley Rubio was een and examined by me this AM.  She is more confused today.  She is oriented to person and month.  She does not know the year or that she is in the hospital.  She denies headache, abdominal pain. Objective: Vital signs in last 24 hours: Filed Vitals:   01/06/13 2034 01/06/13 2035 01/07/13 0603 01/07/13 0930  BP:  150/59 156/73 146/60  Pulse:  67 81 82  Temp:  98.4 F (36.9 C) 98.5 F (36.9 C) 98.6 F (37 C)  TempSrc:  Oral Oral Oral  Resp:  18 18 18   Height:  5\' 6"  (1.676 m)    Weight:  60.601 kg (133 lb 9.6 oz)    SpO2: 98% 95% 97% 98%   Weight change: 0 kg (0 lb)  Intake/Output Summary (Last 24 hours) at 01/07/13 1126 Last data filed at 01/07/13 4098  Gross per 24 hour  Intake 3138.33 ml  Output   1425 ml  Net 1713.33 ml   General: resting in bed in NAD, answers questions but with some inappropriate answers  HEENT: PERRL, dry mucous membranes Cardiac: irregular rhythm Pulm: poor effort but clear to auscultation bilaterally Abd: soft, nontender, nondistended, BS present, no suprapubic tenderness Ext: RUE edema, slightly TTP Neuro: alert and oriented X1, responsive and cooperative, referring to members of treatment team as family members  Lab Results: Basic Metabolic Panel:  Recent Labs Lab 01/05/13 0505 01/06/13 0420  NA 142 144  K 3.7 3.5  CL 108 111  CO2 25 23  GLUCOSE 85 86  BUN 19 17  CREATININE 0.65 0.71  CALCIUM 8.1* 7.8*   Liver Function Tests:  Recent Labs Lab 01/04/13 1650  AST 17  ALT 10  ALKPHOS 94  BILITOT 1.6*  PROT 6.1  ALBUMIN 2.8*   No results found for this basename: LIPASE, AMYLASE,  in the last 168 hours No results found for this basename: AMMONIA,  in the last 168 hours CBC:  Recent Labs Lab 01/04/13 1650  01/06/13 0420 01/06/13 0921  WBC 9.6  < > 7.5 8.1  NEUTROABS 7.6  --   --  6.3  HGB 12.5  < > 11.1* 11.1*  HCT 38.1  < > 34.0* 34.0*  MCV 93.4  < > 93.7 93.9  PLT 215  < > 192 196  <  > = values in this interval not displayed. Cardiac Enzymes:  Recent Labs Lab 01/05/13 0007  TROPONINI <0.30   BNP: No results found for this basename: PROBNP,  in the last 168 hours D-Dimer: No results found for this basename: DDIMER,  in the last 168 hours CBG: No results found for this basename: GLUCAP,  in the last 168 hours Hemoglobin A1C: No results found for this basename: HGBA1C,  in the last 168 hours Fasting Lipid Panel: No results found for this basename: CHOL, HDL, LDLCALC, TRIG, CHOLHDL, LDLDIRECT,  in the last 168 hours Thyroid Function Tests: No results found for this basename: TSH, T4TOTAL, FREET4, T3FREE, THYROIDAB,  in the last 168 hours Coagulation:  Recent Labs Lab 01/05/13 0505 01/06/13 0420 01/07/13 0410  LABPROT 17.2* 19.5* 20.3*  INR 1.44 1.70* 1.79*   Anemia Panel: No results found for this basename: VITAMINB12, FOLATE, FERRITIN, TIBC, IRON, RETICCTPCT,  in the last 168 hours Urine Drug Screen: Drugs of Abuse  No results found for this basename: labopia, cocainscrnur, labbenz, amphetmu, thcu, labbarb    Alcohol Level: No results found for this basename: ETH,  in the  last 168 hours Urinalysis:  Recent Labs Lab 01/04/13 1751  COLORURINE ORANGE*  LABSPEC 1.020  PHURINE 6.0  GLUCOSEU NEGATIVE  HGBUR NEGATIVE  BILIRUBINUR MODERATE*  KETONESUR 40*  PROTEINUR 30*  UROBILINOGEN 2.0*  NITRITE NEGATIVE  LEUKOCYTESUR MODERATE*    Micro Results: Recent Results (from the past 240 hour(s))  URINE CULTURE     Status: None   Collection Time    01/04/13  5:51 PM      Result Value Range Status   Specimen Description URINE, CATHETERIZED   Final   Special Requests NONE   Final   Culture  Setup Time 01/04/2013 22:48   Final   Colony Count >=100,000 COLONIES/ML   Final   Culture     Final   Value: Multiple bacterial morphotypes present, none predominant. Suggest appropriate recollection if clinically indicated.   Report Status 01/05/2013 FINAL    Final  CULTURE, BLOOD (ROUTINE X 2)     Status: None   Collection Time    01/04/13  6:20 PM      Result Value Range Status   Specimen Description BLOOD ARM LEFT   Final   Special Requests BOTTLES DRAWN AEROBIC ONLY 5CC   Final   Culture  Setup Time 01/04/2013 22:23   Final   Culture     Final   Value:        BLOOD CULTURE RECEIVED NO GROWTH TO DATE CULTURE WILL BE HELD FOR 5 DAYS BEFORE ISSUING A FINAL NEGATIVE REPORT   Report Status PENDING   Incomplete  CULTURE, BLOOD (ROUTINE X 2)     Status: None   Collection Time    01/04/13  6:28 PM      Result Value Range Status   Specimen Description BLOOD HAND LEFT   Final   Special Requests BOTTLES DRAWN AEROBIC ONLY 1CC   Final   Culture  Setup Time 01/05/2013 01:32   Final   Culture     Final   Value:        BLOOD CULTURE RECEIVED NO GROWTH TO DATE CULTURE WILL BE HELD FOR 5 DAYS BEFORE ISSUING A FINAL NEGATIVE REPORT   Report Status PENDING   Incomplete   Studies/Results: No results found. Medications: I have reviewed the patient's current medications. Scheduled Meds: . budesonide-formoterol  2 puff Inhalation BID  . donepezil  5 mg Oral QHS  . enoxaparin (LOVENOX) injection  90 mg Subcutaneous Q24H  . levofloxacin  500 mg Oral Q24H  . lisinopril  10 mg Oral Daily  . sodium chloride  3 mL Intravenous Q12H  . Warfarin - Pharmacist Dosing Inpatient   Does not apply q1800   Continuous Infusions: . 0.9 % NaCl with KCl 20 mEq / L 100 mL/hr (01/07/13 0959)   PRN Meds:. Assessment/Plan: [name] is a 77 year old female with past medical history of Afib (on Coumadin, digoxin and metoprolol) HTN and hx of UTI with altered mental status leading to a recent hospitalization 2.5 weeks ago admitted for AMS likely due to UTI.   1. Altered mental status - worse.  Likely multifactorial secondary to UTI as +UA. Also, pt was recently hospitalized for UTI with similar presentation per the family. After her hospitalization she stayed at a SNF for  a few weeks and was started on Digoxin and Haldol. She has been back home with her son for the past week.  - Holding digoxin secondary to abnormally high level  - will obtain CT head w/o contrast - Normal saline  infusion 100 cc/h  - blood cultures no growth, pending  - will contact PCP and son to get clarification regarding patient's baseline mental status - t/c scheduled seroquel as patient did not sleep last night  2. RUE edema - pt with tender RUE that was first noted this morning.  Will obtain RUE duplex  3. UTI - UA +.  Her physical exam was notable for suprapubic tenderness. Similar presentation 2.5 weeks ago per the family.  - Continue oral Levaquin for a total of 5 days (last day 01/08/13) - first urine culture with multiple bacterial morphotypes, awaiting repeat culture  4. Afib  - Continue coumadin  - INR 1.79 subtherapeutic, also on Lovenox bridging - continue Lovenox bridge  5. Bradycardia - resolved.  HR low 80s today.  Possibly secondary to digoxin toxicity.  - Holding digoxin and metoprolol   6. HTN - continue lisinopril   7. COPD - continue Symbicort inhaler   8. Heart murmer - Soft holosystolic murmur best heard at the apex. History of mitral regurgitation per PCP note brought by patient's son. Afebrile. Blood culture no growth.  - followup blood cultures final report   9. DVT PPX  - Lovenox  - SCDs  Dispo: Disposition is deferred at this time, awaiting improvement of current medical problems.  Anticipated discharge in approximately 1-2 day(s).   The patient does have a current PCP (S Gillermo Murdoch, MD) and does need an [facility] hospital follow-up appointment after discharge.  The patient does not have transportation limitations that hinder transportation to clinic appointments.  .Services Needed at time of discharge: Y = Yes, Blank = No PT:   OT:   RN:   Equipment:   Other:     LOS: 3 days   Evelena Peat, DO 01/07/2013, 11:26 AM

## 2013-01-07 NOTE — Progress Notes (Signed)
   CARE MANAGEMENT NOTE 01/07/2013  Patient:  [facility]   Account Number:  [id number]  Date Initiated:  01/07/2013  Documentation initiated by:  Darlyne Russian  Subjective/Objective Assessment:   Patient admitted with UTI, AMS. She lives with son Juanell Fairly) and daughter in law Barnie Del). Prior to admission active with [facility] home health.     Action/Plan:   Progression of care and discharge planning.   Anticipated DC Date:     Anticipated DC Plan:  HOME W HOME HEALTH SERVICES      DC Planning Services  CM consult      [facility] Choice  HOME HEALTH   Choice offered to / List presented to:  C-4 Adult Children           [facility] agency  [facility] Care   Status of service:  In process, will continue to follow Medicare Important Message given?   (If response is "NO", the following Medicare IM given date fields will be blank) Date Medicare IM given:   Date Additional Medicare IM given:    Discharge Disposition:    Per UR Regulation:    If discussed at Long Length of Stay Meetings, dates discussed:    Comments:  son:  Juanell Fairly  [phone]    daughter in law  Barnie Del [phone]  Spoke with Clovis(son) regarding discharge planning, he gave permission to speak with wife Barnie Del. Call to [facility]. The patient lives at their home and prior to admission she was rec'ing home health PT with [facility] and want to continue with agency.  They have a RW and BSC. NCM will continue to follow for discharge plan and contact [facility] to continue services.  [facility]  [phone]  or [phone] spoke with Johnny Bridge, patient active with home PT services. NCM will continue to updated regarding discharge plans.

## 2013-01-07 NOTE — Progress Notes (Signed)
  Date: 01/07/2013  Patient name: Ashley Rubio  Medical record number: [id number]  Date of birth: 1936-05-28   This patient has been seen and the plan of care was discussed with the house staff. Please see their note for complete details. I concur with their findings with the following additions/corrections:  Agree with plan as outlined by Dr. Andrey Campanile. Delirium appears worse today, likely due to insomnia over the last 1-2 nights. She will finish her UTI treatment today. It is unclear by medical records and family report how she was diagnosed with dementia. There is some discordance in her presentation and the family's report that she was self sufficient one month ago. We will start dementia/acute-toxic metabolic encephalopathy work up with [facility]. Will have her sleep tonite with assistance of medication to see whether need to do lumbar puncture tomorrow. For afib, will start back on metoprolol.  Judyann Munson, MD 01/07/2013, 4:19 PM

## 2013-01-07 NOTE — Progress Notes (Signed)
Speech Language Pathology Dysphagia Treatment Patient Details Name: Ashley Rubio MRN: [id number] DOB: December 27, 1935 Today's Date: 01/07/2013 Time: 4098-1191 SLP Time Calculation (min): 8 min  Assessment / Plan / Recommendation Clinical Impression  F/u after yesterday's swallow eval.  Pt sitting in recliner; disoriented, calling out to family not present.  Consumed limited amounts of thins/purees with improved oral manipulation and timing.  Persisting pharyngeal deficits but no s/s of penetration nor aspiration today when cued for precautions.  Swallow function should improve concomitantly with mental status.  Requiring mod cues overall for safety/precautions with swallowing at this time.      Diet Recommendation  Continue with Current Diet: Dysphagia 2 (fine chop);Thin liquid    SLP Plan Continue with current plan of care   Pertinent Vitals/Pain No c/o pain   Swallowing Goals  SLP Swallowing Goals Patient will consume recommended diet without observed clinical signs of aspiration with: Supervision/safety Swallow Study Goal #1 - Progress: Progressing toward goal Patient will utilize recommended strategies during swallow to increase swallowing safety with: Supervision/safety Swallow Study Goal #2 - Progress: Progressing toward goal  General Temperature Spikes Noted: No Respiratory Status: Room air Behavior/Cognition: Alert;Pleasant mood;Requires cueing Oral Cavity - Dentition: Edentulous;Dentures, not available Patient Positioning: Upright in chair  Oral Cavity - Oral Hygiene Does patient have any of the following "at risk" factors?: Lips - dry, cracked;Tongue - coated Patient is AT RISK - Oral Care Protocol followed (see row info): Yes   Dysphagia Treatment Treatment focused on: Skilled observation of diet tolerance Treatment Methods/Modalities: Skilled observation Patient observed directly with PO's: Yes Type of PO's observed: Thin liquids;Dysphagia 1 (puree) Feeding: Able to  feed self;Needs assist Liquids provided via: Cup Pharyngeal Phase Signs & Symptoms: Suspected delayed swallow initiation;Multiple swallows Type of cueing: Verbal Amount of cueing: Moderate   GO     Ashley Rubio Ashley Rubio 01/07/2013, 2:11 PM

## 2013-01-07 NOTE — Progress Notes (Signed)
Physical Therapy Treatment Patient Details Name: Ashley Rubio MRN: [id number] DOB: [birth year] Today's Date: 01/07/2013 Time: 7829-5621 PT Time Calculation (min): 26 min  PT Assessment / Plan / Recommendation  History of Present Illness [name] is a 77 year old female with past medical history of Afib (on Coumadin, digoxin and metoprolol) HTN and hx of UTI with altered mental status leading to a recent hospitalization 2.5 weeks ago admitted for AMS likely due to UTI.    PT Comments   Pt agreeable to PT, but difficulty participating 2/2 cognition.  Family plans to take pt home and provide 24hr care at home.    Follow Up Recommendations  Home health PT;Supervision/Assistance - 24 hour     Does the patient have the potential to tolerate intense rehabilitation     Barriers to Discharge        Equipment Recommendations  Wheelchair (measurements PT)    Recommendations for Other Services    Frequency Min 3X/week   Progress towards PT Goals Progress towards PT goals: Progressing toward goals  Plan Current plan remains appropriate    Precautions / Restrictions Precautions Precautions: Fall Restrictions Weight Bearing Restrictions: No   Pertinent Vitals/Pain Denies pain.      Mobility  Bed Mobility Bed Mobility: Supine to Sit;Sitting - Scoot to Edge of Bed Supine to Sit: 1: +2 Total assist Supine to Sit: Patient Percentage: 10% Sitting - Scoot to Edge of Bed: 1: +2 Total assist Sitting - Scoot to Edge of Bed: Patient Percentage: 0% Details for Bed Mobility Assistance: Max cueing for safe technique and sequencing.  pt difficult to keep on task and to follow directions.   Transfers Transfers: Sit to Stand;Stand to Sit;Squat Pivot Transfers Sit to Stand: 1: +2 Total assist;From bed Sit to Stand: Patient Percentage: 10% Stand to Sit: 1: +2 Total assist;To bed;To chair/3-in-1 Stand to Sit: Patient Percentage: 10% Squat Pivot Transfers: 1: +2 Total assist Squat Pivot Transfers:  Patient Percentage: 10% Details for Transfer Assistance: pt fearful and assists PT/OT minimally.  pt remains in crouched position with hip/knees flexed and feet/knees blocked by therapists.   Ambulation/Gait Ambulation/Gait Assistance: Not tested (comment) Stairs: No Wheelchair Mobility Wheelchair Mobility: No    Exercises     PT Diagnosis:    PT Problem List:   PT Treatment Interventions:     PT Goals (current goals can now be found in the care plan section) Acute Rehab PT Goals Time For Goal Achievement: 01/13/13 Potential to Achieve Goals: Fair  Visit Information  Last PT Received On: 01/07/13 Assistance Needed: +2 PT/OT Co-Evaluation/Treatment: Yes History of Present Illness: [name] is a 77 year old female with past medical history of Afib (on Coumadin, digoxin and metoprolol) HTN and hx of UTI with altered mental status leading to a recent hospitalization 2.5 weeks ago admitted for AMS likely due to UTI.     Subjective Data  Subjective: Did my neice call you?     Cognition  Cognition Arousal/Alertness: Awake/alert Behavior During Therapy: Flat affect Overall Cognitive Status: Impaired/Different from baseline Area of Impairment: Orientation;Attention;Memory;Following commands;Safety/judgement;Awareness;Problem solving Orientation Level: Disoriented to;Place;Time;Situation Current Attention Level: Focused Memory: Decreased short-term memory Following Commands: Follows one step commands inconsistently;Follows one step commands with increased time Safety/Judgement: Decreased awareness of safety;Decreased awareness of deficits Problem Solving: Slow processing;Decreased initiation;Difficulty sequencing;Requires verbal cues;Requires tactile cues    Balance  Balance Balance Assessed: Yes Static Sitting Balance Static Sitting - Balance Support: Bilateral upper extremity supported;Feet supported Static Sitting - Level of Assistance: 5: Stand by assistance;4:  Min  assist Static Sitting - Comment/# of Minutes: pt fluctuated between S and MinA to maintain balance pending attention.    End of Session PT - End of Session Equipment Utilized During Treatment: Gait belt Activity Tolerance: Patient limited by fatigue Patient left: in chair;with call bell/phone within reach Nurse Communication: Mobility status   GP     Sunny Schlein, [place] [phone] 01/07/2013, 9:13 AM

## 2013-01-08 LAB — CBC WITH DIFFERENTIAL/PLATELET
Basophils Absolute: 0 10*3/uL (ref 0.0–0.1)
Basophils Relative: 0 % (ref 0–1)
Eosinophils Absolute: 0.1 10*3/uL (ref 0.0–0.7)
Hemoglobin: 12.1 g/dL (ref 12.0–15.0)
MCH: 30.9 pg (ref 26.0–34.0)
MCHC: 33.8 g/dL (ref 30.0–36.0)
Monocytes Absolute: 0.7 10*3/uL (ref 0.1–1.0)
Monocytes Relative: 10 % (ref 3–12)
Neutro Abs: 5.5 10*3/uL (ref 1.7–7.7)
Neutrophils Relative %: 78 % — ABNORMAL HIGH (ref 43–77)
RDW: 13.8 % (ref 11.5–15.5)

## 2013-01-08 LAB — BASIC METABOLIC PANEL
BUN: 7 mg/dL (ref 6–23)
Chloride: 109 mEq/L (ref 96–112)
Creatinine, Ser: 0.51 mg/dL (ref 0.50–1.10)
GFR calc Af Amer: >90 mL/min (ref >90)
GFR calc non Af Amer: >90 mL/min (ref >90)

## 2013-01-08 LAB — PROTIME-INR
INR: 2.05 — ABNORMAL HIGH (ref 0.00–1.49)
Prothrombin Time: 22.5 seconds — ABNORMAL HIGH (ref 11.6–15.2)

## 2013-01-08 MED ORDER — WARFARIN SODIUM 2 MG PO TABS: 3.0000 mg | ORAL_TABLET | Freq: Every day | ORAL | Status: DC

## 2013-01-08 MED ORDER — WARFARIN SODIUM 3 MG PO TABS
3.0000 mg | ORAL_TABLET | Freq: Once | ORAL | Status: AC
  Administered 2013-01-08: 3 mg via ORAL
  Filled 2013-01-08: qty 1

## 2013-01-08 MED ORDER — HALOPERIDOL 2 MG PO TABS: 2.0000 mg | ORAL_TABLET | Freq: Three times a day (TID) | ORAL | Status: DC | PRN

## 2013-01-08 NOTE — Progress Notes (Signed)
ANTICOAGULATION CONSULT NOTE - Follow Up Consult  Pharmacy Consult for Warfarin and Lovenox Indication: atrial fibrillation  Allergies  Allergen Reactions  . Ambien (Zolpidem Tartrate)     tachycardia    . Codeine Itching  . Penicillins Swelling  . Tartrazine Nausea Only    Patient Measurements: Height: 5\' 6"  (167.6 cm) Weight: 133 lb 9.6 oz (60.601 kg) IBW/kg (Calculated) : 59.3   Vital Signs: Temp: 98 F (36.7 C) (08/05 1000) Temp src: Oral (08/05 1000) BP: 148/72 mmHg (08/05 1000) Pulse Rate: 78 (08/05 1000)  Labs:  Recent Labs  01/06/13 0420 01/06/13 0921 01/07/13 0410 01/07/13 1100 01/08/13 0500  HGB 11.1* 11.1*  --   --  12.1  HCT 34.0* 34.0*  --   --  35.8*  PLT 192 196  --   --  203  LABPROT 19.5*  --  20.3*  --  22.5*  INR 1.70*  --  1.79*  --  2.05*  CREATININE 0.71  --   --  0.50 0.51    Estimated Creatinine Clearance: 56.9 ml/min (by C-G formula based on Cr of 0.51).   Medications:  Scheduled:  . budesonide-formoterol  2 puff Inhalation BID  . donepezil  5 mg Oral QHS  . enoxaparin (LOVENOX) injection  90 mg Subcutaneous Q24H  . lisinopril  10 mg Oral Daily  . metoprolol tartrate  25 mg Oral BID  . sodium chloride  3 mL Intravenous Q12H  . Warfarin - Pharmacist Dosing Inpatient   Does not apply q1800    Assessment: 77 yo F on warfarin (2mg  daily PTA) for AFib admitted with AMS thought 2/2 UTI.  INR was SUBtherapeutic on admission but is now therapeutic after increasing Coumadin dose.  She was started on treatment dose Lovenox to bridge until INR was at goal which can now be d/c'd.   Goal of Therapy:  INR 2-3 Anti-Xa level 0.6-1.2 units/ml 4hrs after LMWH dose given Monitor platelets by anticoagulation protocol: Yes   Plan:  1. Warfarin 3 mg po x1 tonight 2. Daily PT/INR 3. Discontinue Lovenox 90mg  subq Q24hr 4. CBC Q72hr 5. Follow for any signs/symptoms of bleeding  [employer], Pharm.D., BCPS Clinical Pharmacist Pager  [phone] 01/08/2013 1:49 PM

## 2013-01-08 NOTE — Progress Notes (Signed)
Subjective: Ashley Rubio was seen and examined by me this AM.  She is more oriented than yesterday.  She denies complaint.  The nurse reports that Ashley Rubio slept the majority of the night last night.  Objective: Vital signs in last 24 hours: Filed Vitals:   01/07/13 2020 01/07/13 2106 01/08/13 0531 01/08/13 1000  BP:  153/76 150/67 148/72  Pulse:  91 73 78  Temp:  97.8 F (36.6 C) 98.3 F (36.8 C) 98 F (36.7 C)  TempSrc:  Oral Oral Oral  Resp:  18 18 18   Height:  5\' 6"  (1.676 m)    Weight:  60.601 kg (133 lb 9.6 oz)    SpO2: 97% 95% 96% 98%   Weight change: 0 kg (0 lb)  Intake/Output Summary (Last 24 hours) at 01/08/13 1144 Last data filed at 01/08/13 0900  Gross per 24 hour  Intake    250 ml  Output    800 ml  Net   -550 ml   General: resting in bed in NAD HEENT: PERRL, mucous membranes moist Cardiac: irregular rhythm Pulm: clear to auscultation bilaterally Abd: soft, nontender, nondistended, BS present Ext: no pedal edema, RUE edema present Neuro: AAO to self and place (hospital) and state, MMSE score 16  Lab Results: Basic Metabolic Panel:  Recent Labs Lab 01/07/13 1100 01/08/13 0500  NA 139 139  K 3.9 4.7  CL 106 109  CO2 22 20  GLUCOSE 91 76  BUN 8 7  CREATININE 0.50 0.51  CALCIUM 8.3* 7.6*   Liver Function Tests:  Recent Labs Lab 01/04/13 1650  AST 17  ALT 10  ALKPHOS 94  BILITOT 1.6*  PROT 6.1  ALBUMIN 2.8*   No results found for this basename: LIPASE, AMYLASE,  in the last 168 hours No results found for this basename: AMMONIA,  in the last 168 hours CBC:  Recent Labs Lab 01/06/13 0921 01/08/13 0500  WBC 8.1 7.0  NEUTROABS 6.3 5.5  HGB 11.1* 12.1  HCT 34.0* 35.8*  MCV 93.9 91.3  PLT 196 203   Cardiac Enzymes:  Recent Labs Lab 01/05/13 0007  TROPONINI <0.30   BNP: No results found for this basename: PROBNP,  in the last 168 hours D-Dimer: No results found for this basename: DDIMER,  in the last 168 hours CBG: No  results found for this basename: GLUCAP,  in the last 168 hours Hemoglobin A1C: No results found for this basename: HGBA1C,  in the last 168 hours Fasting Lipid Panel: No results found for this basename: CHOL, HDL, LDLCALC, TRIG, CHOLHDL, LDLDIRECT,  in the last 168 hours Thyroid Function Tests: No results found for this basename: TSH, T4TOTAL, FREET4, T3FREE, THYROIDAB,  in the last 168 hours Coagulation:  Recent Labs Lab 01/05/13 0505 01/06/13 0420 01/07/13 0410 01/08/13 0500  LABPROT 17.2* 19.5* 20.3* 22.5*  INR 1.44 1.70* 1.79* 2.05*   Anemia Panel: No results found for this basename: VITAMINB12, FOLATE, FERRITIN, TIBC, IRON, RETICCTPCT,  in the last 168 hours Urine Drug Screen: Drugs of Abuse  No results found for this basename: labopia, cocainscrnur, labbenz, amphetmu, thcu, labbarb    Alcohol Level: No results found for this basename: ETH,  in the last 168 hours Urinalysis:  Recent Labs Lab 01/04/13 1751  COLORURINE ORANGE*  LABSPEC 1.020  PHURINE 6.0  GLUCOSEU NEGATIVE  HGBUR NEGATIVE  BILIRUBINUR MODERATE*  KETONESUR 40*  PROTEINUR 30*  UROBILINOGEN 2.0*  NITRITE NEGATIVE  LEUKOCYTESUR MODERATE*   Micro Results: Recent Results (from the  past 240 hour(s))  URINE CULTURE     Status: None   Collection Time    01/04/13  5:51 PM      Result Value Range Status   Specimen Description URINE, CATHETERIZED   Final   Special Requests NONE   Final   Culture  Setup Time 01/04/2013 22:48   Final   Colony Count >=100,000 COLONIES/ML   Final   Culture     Final   Value: Multiple bacterial morphotypes present, none predominant. Suggest appropriate recollection if clinically indicated.   Report Status 01/05/2013 FINAL   Final  CULTURE, BLOOD (ROUTINE X 2)     Status: None   Collection Time    01/04/13  6:20 PM      Result Value Range Status   Specimen Description BLOOD ARM LEFT   Final   Special Requests BOTTLES DRAWN AEROBIC ONLY 5CC   Final   Culture  Setup Time  01/04/2013 22:23   Final   Culture     Final   Value:        BLOOD CULTURE RECEIVED NO GROWTH TO DATE CULTURE WILL BE HELD FOR 5 DAYS BEFORE ISSUING A FINAL NEGATIVE REPORT   Report Status PENDING   Incomplete  CULTURE, BLOOD (ROUTINE X 2)     Status: None   Collection Time    01/04/13  6:28 PM      Result Value Range Status   Specimen Description BLOOD HAND LEFT   Final   Special Requests BOTTLES DRAWN AEROBIC ONLY 1CC   Final   Culture  Setup Time 01/05/2013 01:32   Final   Culture     Final   Value:        BLOOD CULTURE RECEIVED NO GROWTH TO DATE CULTURE WILL BE HELD FOR 5 DAYS BEFORE ISSUING A FINAL NEGATIVE REPORT   Report Status PENDING   Incomplete  URINE CULTURE     Status: None   Collection Time    01/06/13  2:13 PM      Result Value Range Status   Specimen Description URINE, CATHETERIZED   Final   Special Requests NONE   Final   Culture  Setup Time     Final   Value: 01/06/2013 21:58     Performed at [facility] Count     Final   Value: >=100,000 COLONIES/ML     Performed at [facility]   Culture     Final   Value: YEAST     Performed at [facility]   Report Status 01/07/2013 FINAL   Final   Studies/Results: Ct Head Wo Contrast  01/07/2013   *RADIOLOGY REPORT*  Clinical Data: Altered mental status, delirium, insomnia, currently being treated for UTI, history hypertension  CT HEAD WITHOUT CONTRAST  Technique:  Contiguous axial images were obtained from the base of the skull through the vertex without contrast.  Comparison: None  Findings: Generalized atrophy. Normal ventricular morphology. No midline shift or mass effect. Small vessel chronic ischemic changes of deep cerebral white matter. No intracranial hemorrhage, mass lesion, or acute infarction. Visualized paranasal sinuses and mastoid air cells clear. Bones unremarkable. Atherosclerotic calcification at carotid siphons.  IMPRESSION: Atrophy with small vessel chronic ischemic changes  of deep cerebral white matter. No acute intracranial abnormalities.   Original Report Authenticated By: Ulyses Southward, M.D.   Medications: I have reviewed the patient's current medications. Scheduled Meds: . budesonide-formoterol  2 puff Inhalation BID  . donepezil  5 mg Oral QHS  . enoxaparin (LOVENOX) injection  90 mg Subcutaneous Q24H  . lisinopril  10 mg Oral Daily  . metoprolol tartrate  25 mg Oral BID  . sodium chloride  3 mL Intravenous Q12H  . Warfarin - Pharmacist Dosing Inpatient   Does not apply q1800   Continuous Infusions: . 0.9 % NaCl with KCl 20 mEq / L 100 mL/hr at 01/08/13 0605   PRN Meds:. Assessment/Plan: Ashley Rubio is a 77 year old female with past medical history of Afib (on Coumadin, digoxin and metoprolol) HTN and hx of UTI with altered mental status leading to a recent hospitalization 2.5 weeks ago admitted for AMS likely due to UTI.   1. Altered mental status - patient more oriented today.  Infection, ischemia and digoxin toxicity less likely as the patient has completed 4 days of Levaquin for UTI, head CT w/o acute intracranial abnormalities and digoxin was stopped at admission.  Per phone conversation with the patient's family the patient was evaluated 2 years ago and found to be in the "beginning stages of dementia."  The patient had lived independently up until hospitalization 1 month ago, however, family reports some unusual behavior such as the patient turning the heat all the way up in the home and questioning why it was hot.  They also found dirty dishes in the oven on some occassions.  They feel she has been more confused and aggressive since being hospitalized 1 month ago.  This is most likely a worsening of patient's underlying dementia and she will likely not return to baseline.  24 hour supervision is recommended, either in a SNF or at home with family. Pt son and daughter-in-law would like their mom to come back to their home at discharge.  They say they are  able to provide 24hr supervision for her.  Will outline medication regimen for them in d/c . - Will resume home meds upon d/c - Will outline medication regimen in d/c summary so family clearly understands - Will see pt in clinic for follow-up  2. RUE edema - +DVT in R axillary vein and superficial thrombus in R basilic vein.  Pt already on coumadin and lovenox.  Will continue coumadin upon d/c.   3. UTI - UA +. Her physical exam was notable for suprapubic tenderness. Similar presentation 2.5 weeks ago per the family.  Last levaquin day was (yesterday) 01/07/13 and pt currently asymptomatic.  No CVAT, fever or leukocytosis. - awaiting repeat culture   4. Afib  - metoprolol 25mg  BID added yesterday after pt became tachy into the 150s  - Continue coumadin, metoprolol and digoxin upon d/c - INR 2.05 - per pharmacy, Lovenox can be stopped upon d/c from hospital - check INR 01/11/13  5. HTN - continue lisinopril   6. COPD - continue Symbicort inhaler   7. Heart murmer - Soft holosystolic murmur best heard at the apex. History of mitral regurgitation per PCP note brought by patient's son. Afebrile. Blood culture no growth.  - followup blood cultures final report   9. DVT PPX  - Lovenox  - SCDs   Dispo: Disposition is deferred at this time, awaiting improvement of current medical problems.  Anticipated discharge in approximately 0-1 day(s).   The patient does have a current PCP (S Gillermo Murdoch, MD) and does need an [facility] hospital follow-up appointment after discharge.  The patient does not have transportation limitations that hinder transportation to clinic appointments.  .Services Needed at time of discharge: Y =  Yes, Blank = No PT:   OT:   RN:   Equipment:   Other:     LOS: 4 days   Evelena Peat, DO 01/08/2013, 11:44 AM

## 2013-01-08 NOTE — Progress Notes (Signed)
Spoke with on call RN. Informed her of pt's discharge today and additional needs. She is to have someone at office to give [place] a call if any questions. Foley and IV d/c'd. All belongings gathered by son.

## 2013-01-08 NOTE — Discharge Summary (Signed)
Patient Name:  Ashley Rubio  MRN: [id number]  PCP: Luna Fuse, MD  DOB:  Nov 27, 1935       Date of Admission:  01/04/2013  Date of Discharge:  01/08/2013      Attending Physician: Dr. Judyann Munson, MD         DISCHARGE DIAGNOSES: 1. Principal Problem: 2.   Altered mental status 3. Active Problems: 4.   Atrial fibrillation 5.   Hypertension 6.   Urinary tract infection 7.    DISPOSITION AND FOLLOW-UP: Katria Botts is to follow-up with the listed providers as detailed below, at patient's visiting, please address following issues:  [facility] for hospital follow-up and INR check Appointment date:  01/11/13   Discharge Orders   Future Orders Complete By Expires     Call MD for:  difficulty breathing, headache or visual disturbances  As directed     Call MD for:  extreme fatigue  As directed     Call MD for:  hives  As directed     Call MD for:  persistant dizziness or light-headedness  As directed     Call MD for:  persistant nausea and vomiting  As directed     Call MD for:  severe uncontrolled pain  As directed     Call MD for:  temperature >100.4  As directed     Diet - low sodium heart healthy  As directed     Increase activity slowly  As directed         DISCHARGE MEDICATIONS:   Medication List         budesonide-formoterol 80-4.5 MCG/ACT inhaler  Commonly known as:  SYMBICORT  Inhale 2 puffs into the lungs 2 (two) times daily.     digoxin 0.25 MG tablet  Commonly known as:  LANOXIN  Take 0.25 mg by mouth daily.     donepezil 5 MG tablet  Commonly known as:  ARICEPT  Take 5 mg by mouth at bedtime.     haloperidol 2 MG tablet  Commonly known as:  HALDOL  Take 1 tablet (2 mg total) by mouth 3 (three) times daily as needed. For delirium.  Please do not give it patient is drowsy.     lisinopril 10 MG tablet  Commonly known as:  PRINIVIL,ZESTRIL  Take 10 mg by mouth daily.     metoprolol 50 MG tablet    Commonly known as:  LOPRESSOR  Take 50 mg by mouth 2 (two) times daily.     warfarin 2 MG tablet  Commonly known as:  COUMADIN  Take 1.5 tablets (3 mg total) by mouth daily. Takes daily at 5pm         CONSULTS:    PT/OT   PROCEDURES PERFORMED:  Dg Chest 2 View  01/04/2013   *RADIOLOGY REPORT*  Clinical Data: Dehydration.  CHEST - 2 VIEW  Comparison: None.  Findings: The heart is borderline enlarged.  The mediastinal and hilar contours are within normal limits.  A probable chronic bronchitic type interstitial lung changes but no definite acute infiltrates or pleural effusion.  Low lung volumes with vascular crowding and streaky atelectasis.  The bony thorax is intact.  IMPRESSION: Mild cardiac enlargement. Low lung volumes with vascular crowding and atelectasis. Probable underlying chronic bronchitic type lung changes.   Original Report Authenticated By: Rudie Meyer, M.D.   Ct Head Wo Contrast  01/07/2013   *RADIOLOGY REPORT*  Clinical Data: Altered mental status, delirium, insomnia,  currently being treated for UTI, history hypertension  CT HEAD WITHOUT CONTRAST  Technique:  Contiguous axial images were obtained from the base of the skull through the vertex without contrast.  Comparison: None  Findings: Generalized atrophy. Normal ventricular morphology. No midline shift or mass effect. Small vessel chronic ischemic changes of deep cerebral white matter. No intracranial hemorrhage, mass lesion, or acute infarction. Visualized paranasal sinuses and mastoid air cells clear. Bones unremarkable. Atherosclerotic calcification at carotid siphons.  IMPRESSION: Atrophy with small vessel chronic ischemic changes of deep cerebral white matter. No acute intracranial abnormalities.   Original Report Authenticated By: Ulyses Southward, M.D.       ADMISSION DATA: H&P:  [name] is a 77 year old female with past medical history of atrial fibrillation on Coumadin, digoxin and metoprolol; hypertension; and  recent hospitalization for urinary tract infection with altered mental status, who presents today with altered mental status. History obtained from the patient and her son, who lives with her.   For the past day, the patient's son notes she has been lethargic, confused, and disoriented. Also endorses decreased by mouth intake as well as diffuse weakness. She has been unable to get out of bed or walk to the restroom. Denies focal weakness or facial drooping. Per the family she may also be having some abdominal pain. No nausea, vomiting, diarrhea. Two and half weeks ago, the patient had similar symptoms along with dysuria, and was hospitalized for urinary tract infection. After a week long hospital stay, she was discharged to a rehabilitation facility, and then to home 6 days ago. The family notes that her mental status did improve when she came home from the rehabilitation facility, but it never returned to baseline. Prior to the hospitalization, the patient lived alone and was independent in all activities of daily living, though she did have a diagnosis of mild baseline dementia. No current alcohol use.   The family notes she was also hospitalized about one month ago for increased lower extremity edema, which resolved. It is unknown if she has a history of heart failure. She does have a history of venous insufficiency. Her cardiologist is Dr. Park Breed in [place], [place].   Physical Exam: Blood pressure 141/47, pulse 55, temperature 98.1 F (36.7 C), temperature source Oral, resp. rate 20, weight 133 lb 9.6 oz (60.6 kg), SpO2 96.00%.   Constitutional:  Chronically ill-appearing, sunken face, no acute distress   Mouth/Throat: Mucous membranes are dry.  Eyes: Conjunctivae and EOM are normal. Pupils are equal, round, and reactive to light.  Neck: Normal range of motion. Neck supple.  Cardiovascular: Intact distal pulses. An irregularly irregular rhythm present. Bradycardia present.  Murmur heard.  Systolic  murmur is present with a grade of 2/6  Soft holosystolic murmur best heard at the apex. Pulse rate 40-45. No lower extremity edema.  Pulmonary/Chest: Breath sounds normal. No respiratory distress. She has no wheezes. She has no rales.  Breath sounds normal, but poor effort secondary to mental status.  Abdominal: Soft. Normal appearance, normal aorta and bowel sounds are normal. She exhibits no distension, no abdominal bruit and no mass. There is no hepatosplenomegaly. There is tenderness in the suprapubic area. There is no CVA tenderness.  Musculoskeletal: She exhibits no edema.  Neurological: She has intact cranial nerves. She displays no weakness. She displays no Babinski's sign on the right side. She displays no Babinski's sign on the left side.  Reflex Scores:  Bicep reflexes are 2+ on the right side and 2+ on the left  side. Lethargic. Oriented only to self. Only responding to about half of my instructions during physical exam. Cranial nerves grossly intact, but exam limited by mental status. GCS of 14, losing 1 point for orientation.  Skin: Skin is warm and dry. No cyanosis. Nails show no clubbing.   Labs: Basic Metabolic Panel:   Recent Labs   01/04/13 1650   NA  143   K  3.2*   CL  106   CO2  26   GLUCOSE  105*   BUN  21   CREATININE  0.70   CALCIUM  8.6    Liver Function Tests:   Recent Labs   01/04/13 1650   AST  17   ALT  10   ALKPHOS  94   BILITOT  1.6*   PROT  6.1   ALBUMIN  2.8*    CBC:   Recent Labs   01/04/13 1650   WBC  9.6   NEUTROABS  7.6   HGB  12.5   HCT  38.1   MCV  93.4   PLT  215    Cardiac Enzymes:   Recent Labs   01/05/13 0007   TROPONINI  <0.30    Urinalysis:   Recent Labs   01/04/13 1751   COLORURINE  ORANGE*   LABSPEC  1.020   PHURINE  6.0   GLUCOSEU  NEGATIVE   HGBUR  NEGATIVE   BILIRUBINUR  MODERATE*   KETONESUR  40*   PROTEINUR  30*   UROBILINOGEN  2.0*   NITRITE  NEGATIVE   LEUKOCYTESUR  MODERATE*    Misc. Labs:    Digoxin Level   Date  Value  Range  Status   01/04/2013  2.5*  0.8 - 2.0 ng/mL  Final    Lactic Acid, Venous   Date  Value  Range  Status   01/04/2013  1.5  0.5 - 2.2 mmol/L  Final     HOSPITAL COURSE: Ms. Elzey was admitted on 01/04/13 for altered mental status in the setting of positive UA and elevated digoxin level.  She had 2 prior hospitalizations and a stay at SNF in the month prior to this admission.  During the initial hospitalization she was exhibiting delirium and Aricept and Haldol were started.  Per family, two years ago she was told she was in the early stages of dementia.  They say they noticed a dramatic decline in her mental status during the past month. She has not returned to her baseline since then and has continued periods of aggression and confusion, particularly at night.    We treated her UTI with Levaquin (4 days) and stopped Digoxin during admission.  CT scan was negative for acute intracranial abnormality.   INR at admission was subtherapeutic at 1.44 on coumadin, however it improved to 2.05 and Lovenox was added to bridge.  She developed RUE swelling on hospital day 4 and RUE duplex revealed a DVT.  No intervention was necessary as she was already receiving Coumadin and Lovenox and had an improving INR.  Ms. Staples dementia waxed and waned during the course of this hospitalization.  She was particularly more confused after a period of insomnia.  However, the night prior to discharge she slept well and was more oriented the following morning.  This presentation is most likely a worsening of her underlying dementia and she will likely not return to baseline.  She was more oriented on hospital day 5 and was discharged to her son and  daughter-in-law's home where they are able to provide 24hr supervision for her. The family has been set-up for [facility], a hospital bed and a wheelchair.     DISCHARGE DATA: Vital Signs: BP 148/82  Pulse 56  Temp(Src) 98.4  F (36.9 C) (Oral)  Resp 18  Ht 5\' 6"  (1.676 m)  Wt 60.601 kg (133 lb 9.6 oz)  BMI 21.57 kg/m2  SpO2 97%  Labs: Results for orders placed during the hospital encounter of 01/04/13 (from the past 24 hour(s))  PROTIME-INR     Status: Abnormal   Collection Time    01/08/13  5:00 AM      Result Value Range   Prothrombin Time 22.5 (*) 11.6 - 15.2 seconds   INR 2.05 (*) 0.00 - 1.49  CBC WITH DIFFERENTIAL     Status: Abnormal   Collection Time    01/08/13  5:00 AM      Result Value Range   WBC 7.0  4.0 - 10.5 K/uL   RBC 3.92  3.87 - 5.11 MIL/uL   Hemoglobin 12.1  12.0 - 15.0 g/dL   HCT 78.2 (*) 95.6 - 21.3 %   MCV 91.3  78.0 - 100.0 fL   MCH 30.9  26.0 - 34.0 pg   MCHC 33.8  30.0 - 36.0 g/dL   RDW 08.6  57.8 - 46.9 %   Platelets 203  150 - 400 K/uL   Neutrophils Relative % 78 (*) 43 - 77 %   Neutro Abs 5.5  1.7 - 7.7 K/uL   Lymphocytes Relative 10 (*) 12 - 46 %   Lymphs Abs 0.7  0.7 - 4.0 K/uL   Monocytes Relative 10  3 - 12 %   Monocytes Absolute 0.7  0.1 - 1.0 K/uL   Eosinophils Relative 1  0 - 5 %   Eosinophils Absolute 0.1  0.0 - 0.7 K/uL   Basophils Relative 0  0 - 1 %   Basophils Absolute 0.0  0.0 - 0.1 K/uL  BASIC METABOLIC PANEL     Status: Abnormal   Collection Time    01/08/13  5:00 AM      Result Value Range   Sodium 139  135 - 145 mEq/L   Potassium 4.7  3.5 - 5.1 mEq/L   Chloride 109  96 - 112 mEq/L   CO2 20  19 - 32 mEq/L   Glucose, Bld 76  70 - 99 mg/dL   BUN 7  6 - 23 mg/dL   Creatinine, Ser 6.29  0.50 - 1.10 mg/dL   Calcium 7.6 (*) 8.4 - 10.5 mg/dL   GFR calc non Af Amer >90  >90 mL/min   GFR calc Af Amer >90  >90 mL/min     Services Ordered on Discharge: Y = Yes; Blank = No PT:   OT:   RN:   Equipment:   Other:      Time Spent on Discharge: 35 min   Signed: Evelena Peat PGY 1, Internal Medicine Resident 01/08/2013, 4:36 PM

## 2013-01-08 NOTE — Progress Notes (Signed)
CM referral: home health RN, PT, OT, aide  Patient active with [facility] prior to admission, family requested to resume with [facility].  Frances Furbish: [phone]  Spoke with Steward Drone with referral, faxed orders

## 2013-01-08 NOTE — Progress Notes (Signed)
Physical Therapy Treatment Patient Details Name: Ashley Rubio MRN: [id number] DOB: Jul 09, 1935 Today's Date: 01/08/2013 Time: 4098-1191 PT Time Calculation (min): 25 min  PT Assessment / Plan / Recommendation  History of Present Illness [name] is a 77 year old female with past medical history of Afib (on Coumadin, digoxin and metoprolol) HTN and hx of UTI with altered mental status leading to a recent hospitalization 2.5 weeks ago admitted for AMS likely due to UTI.    PT Comments   Pt with increased drowsiness today.  May need to consider [facility] and hospital bed if plan is still for family to take pt home.    Follow Up Recommendations  Home health PT;Supervision/Assistance - 24 hour     Does the patient have the potential to tolerate intense rehabilitation     Barriers to Discharge        Equipment Recommendations  Wheelchair (measurements PT)    Recommendations for Other Services    Frequency Min 3X/week   Progress towards PT Goals Progress towards PT goals: Not progressing toward goals - comment (Drowsy today)  Plan Current plan remains appropriate    Precautions / Restrictions Precautions Precautions: Fall Restrictions Weight Bearing Restrictions: No   Pertinent Vitals/Pain R UE "tender" per pt.      Mobility  Bed Mobility Bed Mobility: Supine to Sit;Sitting - Scoot to Edge of Bed;Sit to Supine Supine to Sit: 1: +2 Total assist Supine to Sit: Patient Percentage: 10% Sitting - Scoot to Edge of Bed: 1: +2 Total assist Sitting - Scoot to Edge of Bed: Patient Percentage: 0% Sit to Supine: 1: +2 Total assist Sit to Supine: Patient Percentage: 0% Details for Bed Mobility Assistance: Max cueing for safe technique and participation.   Transfers Transfers: Not assessed Ambulation/Gait Ambulation/Gait Assistance: Not tested (comment) Stairs: No Wheelchair Mobility Wheelchair Mobility: No    Exercises     PT Diagnosis:    PT Problem List:   PT Treatment  Interventions:     PT Goals (current goals can now be found in the care plan section) Acute Rehab PT Goals Patient Stated Goal: Daughter wants pt to return to her son's home. Time For Goal Achievement: 01/13/13 Potential to Achieve Goals: Fair  Visit Information  Last PT Received On: 01/08/13 Assistance Needed: +2 History of Present Illness: [name] is a 77 year old female with past medical history of Afib (on Coumadin, digoxin and metoprolol) HTN and hx of UTI with altered mental status leading to a recent hospitalization 2.5 weeks ago admitted for AMS likely due to UTI.     Subjective Data  Subjective: "I'm tired."   Patient Stated Goal: Daughter wants pt to return to her son's home.   Cognition  Cognition Arousal/Alertness:  (Drowsy) Behavior During Therapy: Flat affect Overall Cognitive Status: Impaired/Different from baseline Area of Impairment: Orientation;Attention;Memory;Following commands;Safety/judgement;Awareness;Problem solving Orientation Level: Disoriented to;Place;Time;Situation Current Attention Level: Focused Memory: Decreased short-term memory Following Commands: Follows one step commands inconsistently;Follows one step commands with increased time Safety/Judgement: Decreased awareness of safety;Decreased awareness of deficits Problem Solving: Slow processing;Decreased initiation;Difficulty sequencing;Requires verbal cues;Requires tactile cues    Balance  Balance Balance Assessed: Yes Static Sitting Balance Static Sitting - Balance Support: Bilateral upper extremity supported;Feet supported Static Sitting - Level of Assistance: 4: Min assist;3: Mod assist Static Sitting - Comment/# of Minutes: pt requiring increased A to maintain balance today 2/2 increased drowsiness.  pt leans posteriorly and to L.    End of Session PT - End of Session Activity Tolerance: Patient  limited by fatigue Patient left: in bed;with call bell/phone within reach;with bed alarm  set Nurse Communication: Mobility status   GP     Sunny Schlein, [place] [phone] 01/08/2013, 12:53 PM

## 2013-01-08 NOTE — Progress Notes (Addendum)
  Date: 01/08/2013  Patient name: Ashley Rubio  Medical record number: [id number]  Date of birth: Aug 27, 1935   This patient has been seen and the plan of care was discussed with the house staff. Please see their note for complete details. I concur with their findings with the following additions/corrections:  Agree with assessment and discharge plan for Ashley Rubio.   She appears to have carried diagnosis of dementia for the past 2 years, had been living on her own up until end of June where she has become dependent on family for executive function. She waxes and wanes likely due to insomnia but has had episodes of UTI which has unmasked her degree of dementia. This morning she is no longer delirious after having slept last night. She is looking forward to going home. Dr. Andrey Campanile is coordinating care with her family since they want her back home instead of SNF. We will also arrange for her to be seen in the IM clinic and determine how to have her be seen by a geriatrician and likely follow up again with neurologists as an outpatient.  Judyann Munson, MD 01/08/2013, 4:52 PM

## 2013-01-10 ENCOUNTER — Emergency Department (HOSPITAL_COMMUNITY): Payer: Medicare Other

## 2013-01-10 ENCOUNTER — Inpatient Hospital Stay (HOSPITAL_COMMUNITY)
Admission: EM | Admit: 2013-01-10 | Discharge: 2013-01-20 | DRG: 193 | Disposition: A | Discharge status: Hospice | Payer: Medicare Other | Attending: Internal Medicine | Admitting: Internal Medicine

## 2013-01-10 ENCOUNTER — Encounter (HOSPITAL_COMMUNITY): Payer: Self-pay | Admitting: Radiology

## 2013-01-10 ENCOUNTER — Telehealth: Payer: Self-pay | Admitting: *Deleted

## 2013-01-10 DIAGNOSIS — I10 Essential (primary) hypertension: Secondary | ICD-10-CM | POA: Diagnosis present

## 2013-01-10 DIAGNOSIS — E875 Hyperkalemia: Secondary | ICD-10-CM | POA: Diagnosis present

## 2013-01-10 DIAGNOSIS — J449 Chronic obstructive pulmonary disease, unspecified: Secondary | ICD-10-CM | POA: Diagnosis present

## 2013-01-10 DIAGNOSIS — N390 Urinary tract infection, site not specified: Secondary | ICD-10-CM | POA: Diagnosis present

## 2013-01-10 DIAGNOSIS — Z515 Encounter for palliative care: Secondary | ICD-10-CM

## 2013-01-10 DIAGNOSIS — E46 Unspecified protein-calorie malnutrition: Secondary | ICD-10-CM | POA: Diagnosis present

## 2013-01-10 DIAGNOSIS — R5381 Other malaise: Secondary | ICD-10-CM | POA: Diagnosis present

## 2013-01-10 DIAGNOSIS — E43 Unspecified severe protein-calorie malnutrition: Secondary | ICD-10-CM

## 2013-01-10 DIAGNOSIS — B3749 Other urogenital candidiasis: Secondary | ICD-10-CM | POA: Diagnosis present

## 2013-01-10 DIAGNOSIS — J90 Pleural effusion, not elsewhere classified: Secondary | ICD-10-CM | POA: Diagnosis present

## 2013-01-10 DIAGNOSIS — G9340 Encephalopathy, unspecified: Secondary | ICD-10-CM | POA: Diagnosis present

## 2013-01-10 DIAGNOSIS — I6350 Cerebral infarction due to unspecified occlusion or stenosis of unspecified cerebral artery: Secondary | ICD-10-CM | POA: Diagnosis present

## 2013-01-10 DIAGNOSIS — E876 Hypokalemia: Secondary | ICD-10-CM | POA: Diagnosis not present

## 2013-01-10 DIAGNOSIS — R531 Weakness: Secondary | ICD-10-CM

## 2013-01-10 DIAGNOSIS — R1310 Dysphagia, unspecified: Secondary | ICD-10-CM

## 2013-01-10 DIAGNOSIS — I63139 Cerebral infarction due to embolism of unspecified carotid artery: Secondary | ICD-10-CM | POA: Diagnosis present

## 2013-01-10 DIAGNOSIS — R4182 Altered mental status, unspecified: Secondary | ICD-10-CM

## 2013-01-10 DIAGNOSIS — J4489 Other specified chronic obstructive pulmonary disease: Secondary | ICD-10-CM | POA: Diagnosis present

## 2013-01-10 DIAGNOSIS — R1311 Dysphagia, oral phase: Secondary | ICD-10-CM | POA: Diagnosis present

## 2013-01-10 DIAGNOSIS — F0390 Unspecified dementia without behavioral disturbance: Secondary | ICD-10-CM | POA: Diagnosis present

## 2013-01-10 DIAGNOSIS — J810 Acute pulmonary edema: Secondary | ICD-10-CM

## 2013-01-10 DIAGNOSIS — J189 Pneumonia, unspecified organism: Secondary | ICD-10-CM | POA: Diagnosis present

## 2013-01-10 DIAGNOSIS — B952 Enterococcus as the cause of diseases classified elsewhere: Secondary | ICD-10-CM | POA: Diagnosis present

## 2013-01-10 DIAGNOSIS — E860 Dehydration: Secondary | ICD-10-CM | POA: Diagnosis present

## 2013-01-10 DIAGNOSIS — R1313 Dysphagia, pharyngeal phase: Secondary | ICD-10-CM | POA: Diagnosis present

## 2013-01-10 DIAGNOSIS — M549 Dorsalgia, unspecified: Secondary | ICD-10-CM

## 2013-01-10 DIAGNOSIS — Z7901 Long term (current) use of anticoagulants: Secondary | ICD-10-CM

## 2013-01-10 DIAGNOSIS — Z87440 Personal history of urinary (tract) infections: Secondary | ICD-10-CM | POA: Diagnosis present

## 2013-01-10 DIAGNOSIS — E785 Hyperlipidemia, unspecified: Secondary | ICD-10-CM | POA: Diagnosis present

## 2013-01-10 DIAGNOSIS — R627 Adult failure to thrive: Secondary | ICD-10-CM

## 2013-01-10 DIAGNOSIS — Z66 Do not resuscitate: Secondary | ICD-10-CM | POA: Diagnosis present

## 2013-01-10 DIAGNOSIS — I4891 Unspecified atrial fibrillation: Secondary | ICD-10-CM | POA: Diagnosis present

## 2013-01-10 DIAGNOSIS — E41 Nutritional marasmus: Secondary | ICD-10-CM | POA: Diagnosis present

## 2013-01-10 LAB — URINE MICROSCOPIC-ADD ON

## 2013-01-10 LAB — CULTURE, BLOOD (ROUTINE X 2)

## 2013-01-10 LAB — COMPREHENSIVE METABOLIC PANEL
ALT: 11 U/L (ref 0–35)
AST: 18 U/L (ref 0–37)
Albumin: 2.3 g/dL — ABNORMAL LOW (ref 3.5–5.2)
CO2: 23 mEq/L (ref 19–32)
Calcium: 8.5 mg/dL (ref 8.4–10.5)
Creatinine, Ser: 0.68 mg/dL (ref 0.50–1.10)
Sodium: 139 mEq/L (ref 135–145)
Total Protein: 5.9 g/dL — ABNORMAL LOW (ref 6.0–8.3)

## 2013-01-10 LAB — CBC WITH DIFFERENTIAL/PLATELET
Basophils Absolute: 0 10*3/uL (ref 0.0–0.1)
Lymphocytes Relative: 7 % — ABNORMAL LOW (ref 12–46)
Neutro Abs: 8.6 10*3/uL — ABNORMAL HIGH (ref 1.7–7.7)
Platelets: 228 10*3/uL (ref 150–400)
RDW: 13.8 % (ref 11.5–15.5)
WBC: 10.3 10*3/uL (ref 4.0–10.5)

## 2013-01-10 LAB — URINALYSIS, ROUTINE W REFLEX MICROSCOPIC
Glucose, UA: NEGATIVE mg/dL
Ketones, ur: 40 mg/dL — AB
Nitrite: NEGATIVE
Protein, ur: 30 mg/dL — AB

## 2013-01-10 LAB — TROPONIN I: Troponin I: <0.3 ng/mL (ref <0.30)

## 2013-01-10 LAB — CG4 I-STAT (LACTIC ACID): Lactic Acid, Venous: 1.62 mmol/L (ref 0.5–2.2)

## 2013-01-10 LAB — PROTIME-INR: Prothrombin Time: 28 seconds — ABNORMAL HIGH (ref 11.6–15.2)

## 2013-01-10 LAB — APTT: aPTT: 62 seconds — ABNORMAL HIGH (ref 24–37)

## 2013-01-10 MED ORDER — FLUCONAZOLE IN SODIUM CHLORIDE 200-0.9 MG/100ML-% IV SOLN: 200.0000 mg | INTRAVENOUS | Status: DC

## 2013-01-10 MED ORDER — VANCOMYCIN HCL IN DEXTROSE 1-5 GM/200ML-% IV SOLN
1000.0000 mg | Freq: Once | INTRAVENOUS | Status: AC
  Administered 2013-01-10: 1000 mg via INTRAVENOUS
  Filled 2013-01-10: qty 200

## 2013-01-10 MED ORDER — IOHEXOL 300 MG/ML  SOLN
100.0000 mL | Freq: Once | INTRAMUSCULAR | Status: AC | PRN
  Administered 2013-01-10: 80 mL via INTRAVENOUS

## 2013-01-10 MED ORDER — FLUCONAZOLE 100MG IVPB
100.0000 mg | INTRAVENOUS | Status: DC
  Filled 2013-01-10: qty 50

## 2013-01-10 MED ORDER — CEFEPIME HCL 1 G IJ SOLR
1.0000 g | Freq: Two times a day (BID) | INTRAMUSCULAR | Status: DC
  Administered 2013-01-10: 1 g via INTRAVENOUS
  Filled 2013-01-10 (×2): qty 1

## 2013-01-10 MED ORDER — FLUCONAZOLE IN SODIUM CHLORIDE 200-0.9 MG/100ML-% IV SOLN
200.0000 mg | INTRAVENOUS | Status: DC
  Administered 2013-01-11: 200 mg via INTRAVENOUS
  Filled 2013-01-10: qty 100

## 2013-01-10 MED ORDER — DEXTROSE 5 % IV SOLN
2.0000 g | Freq: Three times a day (TID) | INTRAVENOUS | Status: AC
  Administered 2013-01-10 – 2013-01-18 (×24): 2 g via INTRAVENOUS
  Filled 2013-01-10 (×26): qty 2

## 2013-01-10 MED ORDER — BUDESONIDE-FORMOTEROL FUMARATE 80-4.5 MCG/ACT IN AERO
2.0000 | INHALATION_SPRAY | Freq: Two times a day (BID) | RESPIRATORY_TRACT | Status: DC
  Administered 2013-01-11 – 2013-01-19 (×15): 2 via RESPIRATORY_TRACT
  Filled 2013-01-10 (×3): qty 6.9

## 2013-01-10 MED ORDER — WARFARIN SODIUM 2 MG PO TABS
2.0000 mg | ORAL_TABLET | Freq: Once | ORAL | Status: DC
  Filled 2013-01-10: qty 1

## 2013-01-10 MED ORDER — POTASSIUM CHLORIDE IN NACL 40-0.9 MEQ/L-% IV SOLN
INTRAVENOUS | Status: DC
  Administered 2013-01-10 – 2013-01-13 (×7): via INTRAVENOUS
  Filled 2013-01-10 (×11): qty 1000

## 2013-01-10 MED ORDER — VANCOMYCIN HCL 500 MG IV SOLR
500.0000 mg | Freq: Two times a day (BID) | INTRAVENOUS | Status: DC
  Administered 2013-01-11 – 2013-01-14 (×8): 500 mg via INTRAVENOUS
  Filled 2013-01-10 (×10): qty 500

## 2013-01-10 MED ORDER — WARFARIN - PHARMACIST DOSING INPATIENT: Freq: Every day | Status: DC

## 2013-01-10 MED ORDER — IOHEXOL 300 MG/ML  SOLN
25.0000 mL | INTRAMUSCULAR | Status: AC
  Administered 2013-01-10: 25 mL via ORAL

## 2013-01-10 NOTE — ED Notes (Signed)
Family states that the swelling in the pt's right arm is due to a blood clot that she was diagnosed with within the last week. Family states that the pt is on Coumadin for the blood clot.

## 2013-01-10 NOTE — Progress Notes (Addendum)
ANTICOAGULATION & ANTIBIOTIC CONSULT NOTE - Initial Consult  Pharmacy Consult for Vancomycin + Warfarin Indication: r/o HCAP and hx Afib  Allergies  Allergen Reactions  . Penicillins Anaphylaxis and Swelling  . Ambien (Zolpidem Tartrate)     tachycardia    . Aspirin Nausea And Vomiting  . Codeine Itching  . Tartrazine Nausea Only    Patient Measurements: Height: 5\' 6"  (167.6 cm) Weight: 133 lb 9.6 oz (60.6 kg) IBW/kg (Calculated) : 59.3  Vital Signs: Temp: 98.2 F (36.8 C) (08/07 1433) Temp src: Oral (08/07 1433) BP: 176/88 mmHg (08/07 2015) Pulse Rate: 91 (08/07 2000)  Labs:  Recent Labs  01/08/13 0500 01/10/13 1524  HGB 12.1 13.5  HCT 35.8* 39.6  PLT 203 228  APTT  --  62*  LABPROT 22.5* 28.0*  INR 2.05* 2.73*  CREATININE 0.51 0.68  TROPONINI  --  <0.30    Estimated Creatinine Clearance: 56.9 ml/min (by C-G formula based on Cr of 0.68).   Medical History: Past Medical History  Diagnosis Date  . Hypertension   . Unspecified venous (peripheral) insufficiency   . Personal history of malignant neoplasm of large intestine   . Cancer     colon  . Varicose veins of lower extremities with other complications     Assessment: 77 y.o. F recently discharged from [facility] on 8/5 after presenting with AMS and found to have an elevated digoxin level and UTI -- treated with Levaquin x 4 days. The patient re-presented to the [facility] on 8/7 with weakness, inability to ambulate, and poor appetite. A CXR showed possible PNA and pharmacy was consulted to start Vancomycin along with Aztreonam per MD for r/o HCAP. The patient is noted to have received a dose of Vancomycin 1g at 1830 int the [facility]. Wt: 60.6, SCr: 0.65, CrCl~45-50 ml/min.   Pharmacy was also consulted to resume the patient's home warfarin for hx Afib. INR was 2.73 on admit -- pt recently discharged on 3 mg daily -- however was noted to be on 2 mg daily prior to the last admission. Given the patient's poor appetite and  initiation of antibiotics -- will lower warfarin dose slightly this evening.  Goal of Therapy:  INR 2-3 Vancomycin trough of 15-20 mcg/ml Proper antibiotics for infection/cultures adjusted for renal/hepatic function    Plan:  1. Vancomycin 500 mg IV every 12 hours (next dose due at 0630 on 8/8) 2. Warfarin 2 mg x 1 dose this evening 3. Daily PT/INR 4. Will continue to follow renal function, culture results, LOT, and antibiotic de-escalation plans  5. Will continue to monitor for any signs/symptoms of bleeding and will follow up with PT/INR in the a.m.   Georgina Pillion, PharmD, BCPS Clinical Pharmacist Pager: [phone] 01/10/2013 10:13 PM    MD added aztreonam and diflucan.  Doses appropriate for renal function.

## 2013-01-10 NOTE — Telephone Encounter (Signed)
Not a pt of imc but seen on service recently for admission, family call and states pt is not taking po fluids except to swallow pills, not eating and not able to move self, confused as to day and time. When family called [facility] cindy is present and agrees w/ sending pt via ems to ED, she states lower lung fields congested and shallow resp. She is ask to call 911 and have pt brought to ED

## 2013-01-10 NOTE — ED Provider Notes (Signed)
CSN: [id number]     Arrival date & time 01/10/13  1425 History     First MD Initiated Contact with Patient 01/10/13 1502     Chief Complaint  Patient presents with  . Weakness   (Consider location/radiation/quality/duration/timing/severity/associated sxs/prior Treatment) HPI Comments: 77 y/o woman with hx of HTN, Afib on coumadin and digoxin, comes in with cc of weakness. Pt was recently discharged from the hospital - she was admitted with AMS and found to have a UTI. Pt post discharge, has been unable to move, and family is unable to keep up with her needs. Pt admits to increased weakness, and inability to ambulate. She also stats that she has no appetite and has not been eating or drinking well. Pt has some nausea, no diarrhea, last BM was yday. Pt has been taking her meds as prescribed. ROs is + for sore throat, abd pain, diffuse - but worst in the RUQ. No rashes, no headaches, no chest pain, dib.  Patient is a 77 y.o. female presenting with weakness. The history is provided by the patient and medical records.  Weakness Associated symptoms include abdominal pain. Pertinent negatives include no chest pain and no shortness of breath.    Past Medical History  Diagnosis Date  . Hypertension   . Unspecified venous (peripheral) insufficiency   . Personal history of malignant neoplasm of large intestine   . Cancer     colon  . Varicose veins of lower extremities with other complications    Past Surgical History  Procedure Laterality Date  . Neck surgery  2003  . Tongue biopsy  2003  . Abdominal hysterectomy    . Breast biopsy    . Colonoscopy    . Gastric perforation repair     . Cardioversion    . Leg surgery Left    Family History  Problem Relation Age of Onset  . Kidney cancer Mother    History  Substance Use Topics  . Smoking status: Never Smoker   . Smokeless tobacco: Never Used  . Alcohol Use: No   OB History   Grav Para Term Preterm Abortions TAB SAB Ect Mult  Living                 Review of Systems  Constitutional: Positive for activity change and fatigue. Negative for fever.  HENT: Negative for facial swelling and neck pain.   Respiratory: Negative for cough, shortness of breath and wheezing.   Cardiovascular: Negative for chest pain.  Gastrointestinal: Positive for nausea and abdominal pain. Negative for vomiting, diarrhea, constipation, blood in stool and abdominal distention.  Genitourinary: Negative for hematuria and difficulty urinating.  Skin: Positive for wound. Negative for color change and rash.  Neurological: Positive for weakness. Negative for speech difficulty.  Hematological: Does not bruise/bleed easily.  Psychiatric/Behavioral: Negative for confusion.    Allergies  Penicillins; Ambien; Aspirin; Codeine; and Tartrazine  Home Medications   Current Outpatient Rx  Name  Route  Sig  Dispense  Refill  . budesonide-formoterol (SYMBICORT) 80-4.5 MCG/ACT inhaler   Inhalation   Inhale 2 puffs into the lungs 2 (two) times daily.         . digoxin (LANOXIN) 0.25 MG tablet   Oral   Take 0.25 mg by mouth daily.         Marland Kitchen donepezil (ARICEPT) 5 MG tablet   Oral   Take 5 mg by mouth at bedtime.         Marland Kitchen lisinopril (PRINIVIL,ZESTRIL) 10  MG tablet   Oral   Take 10 mg by mouth daily.         . metoprolol (LOPRESSOR) 50 MG tablet   Oral   Take 50 mg by mouth 2 (two) times daily.         Marland Kitchen warfarin (COUMADIN) 2 MG tablet   Oral   Take 1.5 tablets (3 mg total) by mouth daily. Takes daily at 5pm         . haloperidol (HALDOL) 2 MG tablet   Oral   Take 1 tablet (2 mg total) by mouth 3 (three) times daily as needed. For delirium.  Please do not give it patient is drowsy.          BP 153/76  Pulse 62  Temp(Src) 98.2 F (36.8 C) (Oral)  Resp 18  SpO2 93% Physical Exam  Nursing note and vitals reviewed. Constitutional: She is oriented to person, place, and time. She appears well-developed and well-nourished.   HENT:  Head: Normocephalic and atraumatic.  Eyes: EOM are normal. Pupils are equal, round, and reactive to light.  Neck: Neck supple.  Cardiovascular: Normal rate and regular rhythm.   Murmur heard. Pulmonary/Chest: Effort normal. No respiratory distress.  Abdominal: Soft. She exhibits no distension. There is no tenderness. There is no rebound and no guarding.  Musculoskeletal:  Pressure ulcer stage 1 in the sacrum  Neurological: She is alert and oriented to person, place, and time.  Skin: Skin is warm and dry.    ED Course   Procedures (including critical care time)  Labs Reviewed  CBC WITH DIFFERENTIAL - Abnormal; Notable for the following:    Neutrophils Relative % 84 (*)    Neutro Abs 8.6 (*)    Lymphocytes Relative 7 (*)    All other components within normal limits  PROTIME-INR - Abnormal; Notable for the following:    Prothrombin Time 28.0 (*)    INR 2.73 (*)    All other components within normal limits  APTT - Abnormal; Notable for the following:    aPTT 62 (*)    All other components within normal limits  COMPREHENSIVE METABOLIC PANEL - Abnormal; Notable for the following:    Potassium 3.4 (*)    Total Protein 5.9 (*)    Albumin 2.3 (*)    GFR calc non Af Amer 83 (*)    All other components within normal limits  CULTURE, BLOOD (ROUTINE X 2)  CULTURE, BLOOD (ROUTINE X 2)  URINE CULTURE  TROPONIN I  DIGOXIN LEVEL  URINALYSIS, ROUTINE W REFLEX MICROSCOPIC  CG4 I-STAT (LACTIC ACID)   Dg Chest 2 View  01/10/2013   *RADIOLOGY REPORT*  Clinical Data: Chest pain, hypertension  CHEST - 2 VIEW  Comparison: 01/04/2013  Findings: Minimal enlargement of cardiac silhouette. Rotation to the left. Mediastinal contours and pulmonary vascularity normal. Atherosclerotic calcification aorta. New small bilateral pleural effusions. Additionally there is consolidation in the left lower lobe, new. Question minimal infiltrate right mid lung. No pneumothorax. Bones demineralized.   IMPRESSION: Mild enlargement of cardiac silhouette. Bibasilar effusions with left lower lobe infiltrate and question minimal right mid lung infiltrate.   Original Report Authenticated By: Ulyses Southward, M.D.   No diagnosis found.  MDM   Date: 01/10/2013  Rate: 79  Rhythm: atrial fibrillation  QRS Axis: normal  Intervals: QT prolonged  ST/T Wave abnormalities: nonspecific ST/T changes  Conduction Disutrbances:none  Narrative Interpretation:   Old EKG Reviewed: unchanged  DDx: Sepsis syndrome ACS syndrome ICH/Stroke Infection -  pneumonia/UTI/Cellulitis Dehydration Electrolyte abnormality Tox syndrome  Pt comes in with cc of weakness, inability to ambulate. Family states that a month ago, patient was very functional and independent -but last month she has had admission and infections, and now, she is unable to take care of herself. Pt has no fevers, but has chills, She has a new pressure ulcer - no infection seen. Pt has a cough and sore throat - CXR shows possible PNA, HCAP tx started. Throat exam is benign, she has hx of oral cancer, colon CA. Abd exam - RUQ tenderness is most prominent. [place] is negative. Labs are benign.  Unable to ambulate, will admit.  Derwood Kaplan, MD 01/10/13 1734

## 2013-01-10 NOTE — Telephone Encounter (Signed)
Agree with ED eval

## 2013-01-10 NOTE — H&P (Signed)
Date: 01/10/2013               Patient Name:  Ashley Rubio MRN: [id number]  DOB: 12/09/1935 Age / Sex: 77 y.o., female   PCP: Sherron Monday, MD         Medical Service: Internal Medicine Teaching Service         Attending Physician: Dr. Judyann Munson, MD    First Contact: Dr. Evelena Peat Pager: [phone]   Second Contact: Dr. Lorretta Harp  Pager: [phone]        After Hours (After 5p/  First Contact Pager: [phone]  weekends / holidays): Second Contact Pager: [phone]   Chief Complaint: Weakness  History of Present Illness:  Ashley Rubio is a 77 year old female with past medical history of atrial fibrillation on Coumadin, digoxin and metoprolol; hypertension; and recent hospitalization here at [facility] for urinary tract infection with altered mental status, who presents today with a chief complaint of weakness. History was obtained from the patient and her son, who lives with her.  Ashley Rubio was recently admitted to our service on 01/04/13 for altered mental status in the setting of positive UA and elevated digoxin level. She had 2 prior hospitalizations and a stay at SNF in the month prior to this admission. During the initial hospitalization, she was exhibiting delirium and Aricept and Haldol were added to her home meds. Per family, two years ago she was told she was in the early stages of dementia. Since then they have specifically noted issues with her executive functioning, such as forgetting to pay her bills and having trouble using her household thermostat. She would also occasionally get confused and agitated, especially during snowstorms this winter. However, she lived alone and was independent in most of her ADLs prior to June. Since her hospitalizations they have noted a more dramatic decline in her mental status and level of functioning. She has been largely bed bound, has not returned to her baseline mental status, and has had periods of aggression and confusion, particularly  at night.   Last admission her UTI was treated with with Levaquin (4 days) and Digoxin was stopped in the hospital. CT scan was negative for acute intracranial abnormality. INR at admission was subtherapeutic at 1.44 on coumadin, however it improved to 2.05 and Lovenox was added to bridge. She developed RUE swelling on hospital day 4 and RUE duplex revealed a DVT. No intervention was deemed necessary as she was already receiving Coumadin and Lovenox and had an improving INR. She was discharged to her son and daughter-in-law's home.  Since discharge the family has noted no change or improvement in her mental status. She remains confused and disoriented, but alert and non-agitated. The family brought her in today with a chief complaint of diffuse weakness. She has been unable to leave the bed without assistance since coming home. She endorses mild nausea and decreased appetite; she has not been eating or drinking except for sips to take her medications (which she has been taking as prescribed). She denies abdominal pain, vomiting, diarrhea. Last bowel movement was yesterday. Per the patient's family she has also been complaining of sore throat and productive cough, both present since discharge. The son notes she has also complained of some back and right upper flank pain with transfers, though she does not endorse this. No chest pain, headache, SOB, fevers.  In the emergency department, chest x-ray was performed which suggested left lower lobe infiltrate. Vancomycin was started for treatment of hospital  acquired pneumonia. An abdominal US was ordered which showed no acute abdominal findings but incidental bilateral pleural effusions. CT chest/abdomen showed a large volume of well formed stool in the distal rectum, though to be incidental but possibly 2/2 fecal impaction; potential filling defect in left atrium that could represent an intracardiac thrombus; moderate bilateral pleural effusions with associated  passive atelectasis in the lower lung lobes; and finally suggestion of interstitial lung disease with recommendation for outpatient high-resolution chest CT. Her INR is 2.73. WBC count is 10.3. Lactate within normal limits. Digoxin level 1.0, within normal limits. Mild hypokalemia (k=3.4) on BMP. Negative troponin.   Meds: Current Facility-Administered Medications  Medication Dose Route Frequency Provider Last Rate Last Dose  . 0.9 % NaCl with KCl 40 mEq / L  infusion   Intravenous Continuous Judie Bonus, MD 125 mL/hr at 01/10/13 2321    . aztreonam (AZACTAM) 2 g in dextrose 5 % 50 mL IVPB  2 g Intravenous Q8H Judie Bonus, MD   2 g at 01/10/13 2321  . budesonide-formoterol (SYMBICORT) 80-4.5 MCG/ACT inhaler 2 puff  2 puff Inhalation BID Judie Bonus, MD      . fluconazole (DIFLUCAN) IVPB 200 mg  200 mg Intravenous Q24H Vivi Barrack, MD      . Melene Muller ON 01/11/2013] vancomycin (VANCOCIN) 500 mg in sodium chloride 0.9 % 100 mL IVPB  500 mg Intravenous Q12H Ann Held, [facility]      . [START ON 01/11/2013] Warfarin - Pharmacist Dosing Inpatient   Does not apply q1800 Ann Held, [facility]        Allergies: Allergies as of 01/10/2013 - Review Complete 01/10/2013  Allergen Reaction Noted  . Penicillins Anaphylaxis and Swelling 12/20/2012  . Ambien (zolpidem tartrate)  12/20/2012  . Aspirin Nausea And Vomiting 01/10/2013  . Codeine Itching 12/20/2012  . Tartrazine Nausea Only 12/20/2012   Past Medical History  Diagnosis Date  . Hypertension   . Unspecified venous (peripheral) insufficiency   . Personal history of malignant neoplasm of large intestine   . Cancer     colon  . Varicose veins of lower extremities with other complications    Past Surgical History  Procedure Laterality Date  . Neck surgery  2003  . Tongue biopsy  2003  . Abdominal hysterectomy    . Breast biopsy    . Colonoscopy    . Gastric perforation repair     . Cardioversion    . Leg surgery Left     Family History  Problem Relation Age of Onset  . Kidney cancer Mother    History   Social History  . Marital Status: Single    Spouse Name: N/A    Number of Children: N/A  . Years of Education: N/A   Occupational History  . Not on file.   Social History Main Topics  . Smoking status: Never Smoker   . Smokeless tobacco: Never Used  . Alcohol Use: No  . Drug Use: No  . Sexually Active: Not on file   Other Topics Concern  . Not on file   Social History Narrative  . No narrative on file    Review of Systems: Pertinent items are noted in HPI. Ten point ROS performed.  Physical Exam: Blood pressure 124/74, pulse 85, temperature 97.5 F (36.4 C), temperature source Oral, resp. rate 20, height 5\' 6"  (1.676 m), weight 141 lb 15.6 oz (64.4 kg), SpO2 98.00%. Physical Exam  Constitutional:  Chronically ill-appearing, sunken face,  no acute distress.  HENT:  Mouth/Throat: Mucous membranes are dry.  Crusted secretions in corners of mouth.  Eyes: Conjunctivae and EOM are normal. Pupils are equal, round, and reactive to light.  Neck: Normal range of motion. Neck supple.  Cardiovascular: An irregularly irregular rhythm present.  Murmur heard.  Systolic murmur is present with a grade of 2/6  Holosystolic murmur best heard at the apex. No lower extremity edema.  Pulmonary/Chest: Effort normal. No accessory muscle usage. Not tachypneic. No respiratory distress. She has rales in the left lower field.  Exam limited by poor effort, inability to sit up or turn in bed for a complete auscultation.  Abdominal: Soft. Normal appearance and bowel sounds are normal. There is no tenderness. There is no rigidity, no rebound, no guarding and no CVA tenderness.  Musculoskeletal: She exhibits no edema.  Neurological: She is alert. She displays weakness (Diffuse). No cranial nerve deficit.  Reflex Scores:      Bicep reflexes are 2+ on the right side and 2+ on the left side. Alert. Oriented to  self only. Responding to all questions, but often inappropriately. GCS of 14, losing 1 point for orientation. No focal sensory or motor deficits, but diffusely weak.  Skin: Skin is warm and dry. Nails show no clubbing.  Pressure ulcer stage 1 in the sacrum. Clean, dry, no erythema.     Lab results: Basic Metabolic Panel:  Recent Labs  16/10/96 0500 01/10/13 1524  NA 139 139  K 4.7 3.4*  CL 109 102  CO2 20 23  GLUCOSE 76 82  BUN 7 12  CREATININE 0.51 0.68  CALCIUM 7.6* 8.5   Liver Function Tests:  Recent Labs  01/10/13 1524  AST 18  ALT 11  ALKPHOS 96  BILITOT 0.9  PROT 5.9*  ALBUMIN 2.3*   CBC:  Recent Labs  01/08/13 0500 01/10/13 1524  WBC 7.0 10.3  NEUTROABS 5.5 8.6*  HGB 12.1 13.5  HCT 35.8* 39.6  MCV 91.3 90.4  PLT 203 228   Cardiac Enzymes:  Recent Labs  01/10/13 1524  TROPONINI <0.30   Coagulation:  Recent Labs  01/08/13 0500 01/10/13 1524  LABPROT 22.5* 28.0*  INR 2.05* 2.73*   Urinalysis:  Recent Labs  01/10/13 1720  COLORURINE AMBER*  LABSPEC 1.017  PHURINE 5.5  GLUCOSEU NEGATIVE  HGBUR SMALL*  BILIRUBINUR LARGE*  KETONESUR 40*  PROTEINUR 30*  UROBILINOGEN 1.0  NITRITE NEGATIVE  LEUKOCYTESUR MODERATE*   Misc. Labs: Digoxin Level  Date Value Range Status  01/10/2013 1.0  0.8 - 2.0 ng/mL Final   Lactic Acid, Venous  Date Value Range Status  01/10/2013 1.62  0.5 - 2.2 mmol/L Final     Imaging results:  Dg Chest 2 View  01/10/2013   *RADIOLOGY REPORT*  Clinical Data: Chest pain, hypertension  CHEST - 2 VIEW  Comparison: 01/04/2013  Findings: Minimal enlargement of cardiac silhouette. Rotation to the left. Mediastinal contours and pulmonary vascularity normal. Atherosclerotic calcification aorta. New small bilateral pleural effusions. Additionally there is consolidation in the left lower lobe, new. Question minimal infiltrate right mid lung. No pneumothorax. Bones demineralized.  IMPRESSION: Mild enlargement of cardiac  silhouette. Bibasilar effusions with left lower lobe infiltrate and question minimal right mid lung infiltrate.   Original Report Authenticated By: Ulyses Southward, M.D.   US Abdomen Complete  01/10/2013   *RADIOLOGY REPORT*  Clinical Data:  Right upper quadrant abdominal pain.  History of colon cancer.  COMPLETE ABDOMINAL ULTRASOUND  Comparison:  No priors.  Findings:  Gallbladder:  No shadowing gallstones or echogenic sludge.  No gallbladder wall thickening or pericholecystic fluid.  Negative sonographic Murphy's sign according to the ultrasound technologist.  Common bile duct:  Normal caliber measuring 4.9 mm in the porta hepatis.  Liver:  No focal mass lesion seen.  Within normal limits in parenchymal echogenicity.  IVC:  Patent throughout its visualized course in the abdomen.  Pancreas:  Although the pancreas is difficult to visualize in its entirety, no focal pancreatic abnormality is identified.  Spleen:  Normal size and echotexture without focal parenchymal abnormality.  6.6 cm in length.  Right Kidney:  No hydronephrosis.  Well-preserved cortex.  Normal size and parenchymal echotexture without focal abnormalities. 10.8 cm in length.  Left Kidney:  No hydronephrosis.  Well-preserved cortex.  Normal size and parenchymal echotexture without focal abnormalities. 9.7 cm in length.  Abdominal aorta:  Largely obscured by bowel gas distally, but normal caliber proximally measuring up to 2.0 cm in diameter.  Additional findings:  Bilateral pleural effusions incompletely evaluated.  IMPRESSION: 1.  No acute findings to account for the patient's symptoms. Specifically, no evidence of gallstones or findings to suggest acute cholecystitis at this time. 2.  Bilateral pleural effusions incidentally noted.   Original Report Authenticated By: Trudie Reed, M.D.   Ct Abdomen Pelvis W Contrast  01/10/2013   *RADIOLOGY REPORT*  Clinical Data: Weakness.  Nausea, vomiting and abdominal pain.  CT ABDOMEN AND PELVIS WITH  CONTRAST  Technique:  Multidetector CT imaging of the abdomen and pelvis was performed following the standard protocol during bolus administration of intravenous contrast.  Contrast: 80mL OMNIPAQUE IOHEXOL 300 MG/ML  SOLN  Comparison: No priors.  Findings:  Lung Bases: Moderate bilateral pleural effusions with associated passive atelectasis in the lower lobes of the lungs bilaterally. Peripheral ground-glass attenuation and some subpleural reticulation in the visualized lung bases.  A few thick-walled cysts are noted in the right lower lobe.  Small volume of pericardial fluid.  No associated pericardial calcification in the visualized portions of the thorax.  Heart size appears borderline enlarged.  Atherosclerotic calcification and in the right coronary artery.  There is a suggestion of a small filling defects in the left lateral aspect of the left atrium on image 1 of series 2, however, this is the first image of the study and this is incompletely visualized.  Abdomen/Pelvis:  The appearance of the liver, gallbladder, spleen and bilateral adrenal glands is unremarkable.  Pancreatic atrophy. No focal pancreatic lesions are noted.  Multifocal cortical thinning throughout the kidneys bilaterally, compatible with post infectious or inflammatory scarring.  Multiple subcentimeter low attenuation lesions are noted in the kidneys bilaterally but are too small to definitively characterize.  Extensive atherosclerosis throughout the abdominal and pelvic vasculature, without definite aneurysm or dissection.  Presacral fluid and thickening is of uncertain etiology and significance, but may reflect edema and/or inflammation.  Large amount of well formed stool in the distal rectum.  The more proximal colon is otherwise relatively decompressed, and the overall stone burden appears normal.  Urinary bladder is completely decompressed with Foley balloon catheter in place.  Status post hysterectomy.  Ovaries are atrophic.  No  significant volume of ascites.  No pneumoperitoneum. No pathologic distension of small bowel.  No definite pathologic lymphadenopathy identified within the abdomen or pelvis on today's examination.  Musculoskeletal: There are no aggressive appearing lytic or blastic lesions noted in the visualized portions of the skeleton. Multilevel degenerative disc disease, most severe at L3-L4.  IMPRESSION:  1.  Large volume of well formed stool in the distal rectum.  This is nonspecific, but can be seen in the setting of fecal impaction. Given the lack of the more proximal colonic distension and overall normal stool burden, this is favored to be an incidental finding but clinical correlation is recommended, as there is a large amount of presacral fluid and thickening which may indicate some edema and/or inflammation. 2.  No other potential acute findings in the abdomen or pelvis to account for the patient's symptoms. 3.  Extensive atherosclerosis, including right coronary artery disease. 4.  Image #1 demonstrates a potential filling defect in the lateral aspect of the left atrium immediately posterior to the left atrioventricular groove.  This patient with history of atrial fibrillation, clinical correlation is recommended with consideration for further evaluation with echocardiography to exclude intracardiac thrombus. 5.  Moderate bilateral pleural effusions with associated passive atelectasis throughout the lower lobes of the lungs bilaterally. 6.  Unusual appearance of the lung parenchyma, as above, which could be indicative of an interstitial lung disease.  Follow-up high-resolution chest CT after the patient's acute illness has resolved is suggested to better assess these findings and characterize this potential interstitial lung disease. 7.  Small amount of pericardial fluid, unlikely to be of hemodynamic significance at this time. 8.  Additional incidental findings, as above.   Original Report Authenticated By: Trudie Reed, M.D.    Other results: EKG: unchanged from previous tracings.  Assessment & Plan by Problem: Ashley Rubio is a 77 year old female with past medical history of atrial fibrillation on Coumadin, digoxin and metoprolol; hypertension; and a recent hospitalization here at [facility] for urinary tract infection with altered mental status, who presents today with a chief complaint of diffuse weakness and productive cough, found to have a left lower lobe infiltrate on chest x-ray.  #Health care-acquired pneumonia - Patient with new cough, sore throat, and crackles on exam. Chest x-ray shows left lower lobe infiltrate. Bilateral pleural effusions noted on abdominal ultrasound and chest CT. This clinical picture most likely represents a health care-acquired pneumonia given her many recent hospitalizations. Though not a typical location for an aspiration pneumonia and she has reportedly been NPO x2 days, aspiration is still a legitimate concern in this patient given altered mental status, weakness, crusted oral secretions on physical exam. - Admit to IMTS - Aztreonam and vancomycin IV for HCAP (anaphylaxis to penicillin) - Follow up blood cultures, sputum culture and gram stain - Follow up strep pneumo urine antigen (-), legionella urine antigen, HIV antibody - NPO given aspiration concern - SLP bedside swallowing study in the morning  #Flank pain, decreased appetite - Abdominal exam unremarkable and pain not reproducable. Urine culture from 01/06/13 resulted recently showing >100,000 col/ml yeast. CT abdomen this evening showed a large volume of well formed stool in the distal rectum, incidental vs. fecal impaction.  - Diflucan IV 200mg  q24h for yeast UTI - Repeat urine culture - Consider disimpaction if she develops new abdominal pain  #Diffuse weakness - Probably multifactorial. Infection from the lungs vs. urine likely contributing; afebrile and vital signs stable. She has had multiple hospital  admissions over the past month leading to physical deconditioning. Patient appears dehydrated on exam. Subacute stroke is also possible, however no signs on recent CT head and no focal deficits. Electrolyte abnormality is a consideration as patient was hypokalemic (K=3.4) on admission BMP. ACS on the differential but less likely given negative troponin and unchanged EKG.  - NS infusion @  152ml/her - Treating infections as above - Repeat BMP, CBC tomorrow morning  #Delirium/encephalopathy - Stable since discharge. She remains confused and disoriented, but alert and non-agitated. Likely multifactorial etiology for same reasons as above. She probably has undergone a worsening of her underlying dementia and will not return to her baseline of living independently, and family is aware of this. Subacute stroke is important to consider given history of stepwise decline, especially with the suggestion of left atrial filling defect on CT chest. Not currently agitated or delirious. - Consider brain MRI tomorrow - Holding home Aricept since NPO  #Atrial fibrillation - Patient is therapeutic on coumadin (INR = 2.73). Possible small left atrial filling defect on CT chest, but deferring echocardiogram at this time as it would not change management. HR bradycardic to 45 earlier, now 85. - Telemetry bed - Holding coumadin tonight, will restart tomorrow - Holding metoprolol, digoxin since NPO and history of bradycardia. PR currently 80-90s. If develops tachycardia will add beta-blocker and titrate to goal.  #HTN - BP currently 124/74. - Holding lisinopril since NPO  #COPD - Stable - Continue Symbicort inhaler  #Hypokalemia - Likely secondary to decreased po intake. - Normal saline infusion containing 40 mEq/L of KCl - Checking magnesium - Repeat BMP tomorrow morning  #Heart murmer - Soft holosystolic murmur best heard at the apex. History of mitral regurgitation. Afebrile. No stigmata of endocarditis on  exam. - Followup blood cultures  #DVT PPX - Home coumadin   Dispo: Disposition is deferred at this time, awaiting improvement of current medical problems. Anticipated discharge in approximately 1-3 day(s).   The patient does have a current PCP (S Gillermo Murdoch, MD) and does need an [facility] hospital follow-up appointment after discharge.  The patient does not have transportation limitations that hinder transportation to clinic appointments.  Signed: Vivi Barrack, MD 01/10/2013, 11:26 PM

## 2013-01-10 NOTE — ED Notes (Signed)
Per report from [facility] pt has been having increased and weakness and decreased PO intake.  A/o x person, place, and time.  No distress noted.  C/o Nausea, no vomiting or diarrhea reported.

## 2013-01-11 ENCOUNTER — Encounter (HOSPITAL_COMMUNITY): Payer: Self-pay | Admitting: *Deleted

## 2013-01-11 ENCOUNTER — Ambulatory Visit: Payer: Medicare Other | Admitting: Internal Medicine

## 2013-01-11 DIAGNOSIS — I319 Disease of pericardium, unspecified: Secondary | ICD-10-CM

## 2013-01-11 DIAGNOSIS — J189 Pneumonia, unspecified organism: Principal | ICD-10-CM

## 2013-01-11 LAB — STREP PNEUMONIAE URINARY ANTIGEN: Strep Pneumo Urinary Antigen: NEGATIVE

## 2013-01-11 LAB — CBC
HCT: 35.1 % — ABNORMAL LOW (ref 36.0–46.0)
Hemoglobin: 11.6 g/dL — ABNORMAL LOW (ref 12.0–15.0)
MCHC: 33 g/dL (ref 30.0–36.0)
MCV: 90.2 fL (ref 78.0–100.0)

## 2013-01-11 LAB — LEGIONELLA ANTIGEN, URINE

## 2013-01-11 LAB — CULTURE, BLOOD (ROUTINE X 2): Culture: NO GROWTH

## 2013-01-11 LAB — BASIC METABOLIC PANEL
BUN: 10 mg/dL (ref 6–23)
CO2: 22 mEq/L (ref 19–32)
Chloride: 104 mEq/L (ref 96–112)
GFR calc Af Amer: >90 mL/min (ref >90)
Potassium: 3.7 mEq/L (ref 3.5–5.1)

## 2013-01-11 LAB — PROTIME-INR: Prothrombin Time: 29.2 seconds — ABNORMAL HIGH (ref 11.6–15.2)

## 2013-01-11 MED ORDER — WARFARIN SODIUM 2 MG PO TABS
2.0000 mg | ORAL_TABLET | Freq: Once | ORAL | Status: AC
  Administered 2013-01-11: 2 mg via ORAL
  Filled 2013-01-11: qty 1

## 2013-01-11 MED ORDER — RESOURCE THICKENUP CLEAR PO POWD
ORAL | Status: DC | PRN
  Administered 2013-01-11: 22:00:00 via ORAL
  Filled 2013-01-11: qty 125

## 2013-01-11 MED ORDER — CHLORHEXIDINE GLUCONATE 0.12 % MT SOLN
15.0000 mL | Freq: Two times a day (BID) | OROMUCOSAL | Status: DC
  Administered 2013-01-11 – 2013-01-19 (×16): 15 mL via OROMUCOSAL
  Filled 2013-01-11 (×21): qty 15

## 2013-01-11 MED ORDER — MAGNESIUM SULFATE 40 MG/ML IJ SOLN
2.0000 g | Freq: Once | INTRAMUSCULAR | Status: AC
  Administered 2013-01-12: 2 g via INTRAVENOUS
  Filled 2013-01-11: qty 50

## 2013-01-11 MED ORDER — HALOPERIDOL LACTATE 5 MG/ML IJ SOLN
1.0000 mg | Freq: Once | INTRAMUSCULAR | Status: AC
  Administered 2013-01-11: 1 mg via INTRAVENOUS
  Filled 2013-01-11: qty 1

## 2013-01-11 MED ORDER — WARFARIN - PHARMACIST DOSING INPATIENT
Freq: Every day | Status: DC
  Administered 2013-01-11 – 2013-01-14 (×3)

## 2013-01-11 MED ORDER — HALOPERIDOL LACTATE 5 MG/ML IJ SOLN
1.0000 mg | Freq: Once | INTRAMUSCULAR | Status: DC
  Filled 2013-01-11 (×2): qty 1

## 2013-01-11 MED ORDER — BIOTENE DRY MOUTH MT LIQD
15.0000 mL | Freq: Two times a day (BID) | OROMUCOSAL | Status: DC
  Administered 2013-01-11 – 2013-01-19 (×18): 15 mL via OROMUCOSAL

## 2013-01-11 MED ORDER — DONEPEZIL HCL 5 MG PO TABS
5.0000 mg | ORAL_TABLET | Freq: Every day | ORAL | Status: DC
  Administered 2013-01-11 – 2013-01-14 (×3): 5 mg via ORAL
  Filled 2013-01-11 (×9): qty 1

## 2013-01-11 MED ORDER — HALOPERIDOL LACTATE 5 MG/ML IJ SOLN
1.0000 mg | Freq: Once | INTRAMUSCULAR | Status: AC
  Administered 2013-01-11: 1 mg via INTRAVENOUS

## 2013-01-11 MED ORDER — LISINOPRIL 10 MG PO TABS
10.0000 mg | ORAL_TABLET | Freq: Every day | ORAL | Status: DC
  Administered 2013-01-12 – 2013-01-13 (×2): 10 mg via ORAL
  Filled 2013-01-11 (×4): qty 1

## 2013-01-11 NOTE — Discharge Summary (Signed)
Agree with plan as outlined by Dr. Andrey Campanile

## 2013-01-11 NOTE — Care Management Note (Addendum)
   CARE MANAGEMENT NOTE 01/11/2013  Patient:  [facility]   Account Number:  [id number]  Date Initiated:  01/11/2013  Documentation initiated by:  Johny Shock  Subjective/Objective Assessment:   Pt d/c on 8/5 and readm on 01/10/13.     Action/Plan:   Await eval and decision re d/c needs. Pt active with [facility] for [facility] and HHPT. [facility] notified of pt adm to the hospital.    Anticipated DC Date:     Anticipated DC Plan:  HOME W HOME HEALTH SERVICES         Choice offered to / List presented to:          [facility] arranged  HH-1 RN  HH-2 PT      [facility] agency  [facility]   Status of service:  In process, will continue to follow Medicare Important Message given?   (If response is "NO", the following Medicare IM given date fields will be blank) Date Medicare IM given:   Date Additional Medicare IM given:    Discharge Disposition:  HOME W HOME HEALTH SERVICES  Per UR Regulation:    If discussed at Long Length of Stay Meetings, dates discussed:    Comments:

## 2013-01-11 NOTE — Progress Notes (Signed)
INITIAL NUTRITION ASSESSMENT  DOCUMENTATION CODES Per approved criteria  -Severe malnutrition in the context of chronic illness   INTERVENTION: Magic cup TID with meals, each supplement provides 290 kcal and 9 grams of protein. Monitor magnesium, potassium, and phosphorus daily for at least 3 days, MD to replete as needed, as pt is at risk for refeeding syndrome given recent suboptimal oral intake and significant wt loss. RD to continue to follow nutrition care plan.  NUTRITION DIAGNOSIS: Inadequate oral intake related to poor appetite as evidenced by pt and family report.  Goal: Intake to meet >90% of estimated nutrition needs.  Monitor:  weight trends, lab trends, I/O's, PO intake, supplement tolerance  Reason for Assessment: MD Consult  77 y.o. female  Admitting Dx: HCAP (healthcare-associated pneumonia)  ASSESSMENT: PMHx significant for afib and HTN. Recently admitted with UTI and AMS. Currently admitted with weakness. She endorses mild nausea and decreased appetite; she has not been eating or drinking except for sips to take her medications. Work-up reveals HCAP.  RD consulted for poor PO intake and "Pt currently NPO awaiting SLP evaluation." Discussed with RN, she stated that she has just paged the physician to see if the diet can be ordered. Pt with 10% wt loss x 1 month. Daughter at bedside confirmed that pt had very poor oral intake PTA x 1 month. She states that pt will sometimes go up to 1-2 days without eating. Does not like Ensure, per daughter, pt has hx of tongue cancer and had to consume a lot of Ensure during that time and now does not like it. Pt declined trying Ensure Pudding.  Nutrition Focused Physical Exam:  Subcutaneous Fat:  Orbital Region: NA Upper Arm Region: severely depleted Thoracic and Lumbar Region: NA  Muscle:  Temple Region: moderately depleted Clavicle Bone Region: severely depleted Clavicle and Acromion Bone Region: NA Scapular Bone Region:  NA Dorsal Hand: NA Patellar Region: NA Anterior Thigh Region: NA Posterior Calf Region: NA  Edema: edematous RUE  Pt meets criteria for severe MALNUTRITION in the context of chronic illness as evidenced by 10% wt loss x 1 month, intake of <75% x at least 1 month, severe muscle mass loss.  Pt has had low potassium and magnesium since admission.   Height: Ht Readings from Last 1 Encounters:  01/10/13 5\' 6"  (1.676 m)    Weight: Wt Readings from Last 1 Encounters:  01/10/13 141 lb 15.6 oz (64.4 kg)    Ideal Body Weight: 130 lb  % Ideal Body Weight: 108%  Wt Readings from Last 10 Encounters:  01/10/13 141 lb 15.6 oz (64.4 kg)  01/07/13 133 lb 9.6 oz (60.601 kg)    Usual Body Weight: 156 lb approx 30 days ago per family  % Usual Body Weight: 90%  BMI:  Body mass index is 22.93 kg/(m^2). WNL  Estimated Nutritional Needs: Kcal: 1550 - 1700 Protein: 65 - 75 grams Fluid: 1.5 - 1.8 liters dailiy  Skin:  Stage II L buttocks Stage I sacrum Skin tear L arm  Diet Order: Dysphagia 1 with Nectar Thickened Liquids  EDUCATION NEEDS: -No education needs identified at this time   Intake/Output Summary (Last 24 hours) at 01/11/13 1554 Last data filed at 01/11/13 0646  Gross per 24 hour  Intake 1227.08 ml  Output    550 ml  Net 677.08 ml    Last BM: PTA  Labs:   Recent Labs Lab 01/08/13 0500 01/10/13 1524 01/10/13 2307 01/11/13 0520  NA 139 139  --  137  K 4.7 3.4*  --  3.7  CL 109 102  --  104  CO2 20 23  --  22  BUN 7 12  --  10  CREATININE 0.51 0.68  --  0.55  CALCIUM 7.6* 8.5  --  8.0*  MG  --   --  1.2*  --   GLUCOSE 76 82  --  77    CBG (last 3)  No results found for this basename: GLUCAP,  in the last 72 hours  Scheduled Meds: . antiseptic oral rinse  15 mL Mouth Rinse q12n4p  . aztreonam  2 g Intravenous Q8H  . budesonide-formoterol  2 puff Inhalation BID  . chlorhexidine  15 mL Mouth Rinse BID  . vancomycin  500 mg Intravenous Q12H     Continuous Infusions: . 0.9 % NaCl with KCl 40 mEq / L 125 mL/hr at 01/11/13 1137    Past Medical History  Diagnosis Date  . Hypertension   . Unspecified venous (peripheral) insufficiency   . Personal history of malignant neoplasm of large intestine   . Cancer     colon  . Varicose veins of lower extremities with other complications     Past Surgical History  Procedure Laterality Date  . Neck surgery  2003  . Tongue biopsy  2003  . Abdominal hysterectomy    . Breast biopsy    . Colonoscopy    . Gastric perforation repair     . Cardioversion    . Leg surgery Left     Jarold Motto MS, RD, LDN Pager: [phone] After-hours pager: [phone]

## 2013-01-11 NOTE — H&P (Signed)
Date: 01/11/2013  Patient name: Ashley Rubio  Medical record number: [id number]  Date of birth: 03-17-1936   I have seen and evaluated Burton Apley and discussed their care with the Residency Team.  Mrs. Albino was recently discharged from our hospital for encephalopathy due to presumed UTI that worsened her underlying dementia. She was reported to becoming increasingly weak at home with poor po intake. She was readmitted for dehydration, HCAP due to LLL infiltrate.  Physical Exam: Blood pressure 152/76, pulse 78, temperature 98.5 F (36.9 C), temperature source Oral, resp. rate 17, height 5\' 6"  (1.676 m), weight 141 lb 15.6 oz (64.4 kg), SpO2 99.00%. Constitutional: oriented to person, place,   appears chronically ill and fatigued. HENT:  Mouth/Throat: Oropharynx is clear and moist. No oropharyngeal exudate.  Cardiovascular: Normal rate, regular rhythm and normal heart sounds. Exam reveals no gallop and no friction rub.  No murmur heard.  Pulmonary/Chest: Effort normal and breath sounds normal. No respiratory distress.decrease BS in LL base. no wheezes.  Abdominal: Soft. Bowel sounds are normal. exhibits no distension. There is no tenderness.  Lymphadenopathy:  no cervical adenopathy.  Neurological:  alert and oriented to person, place, and time.  Skin: Skin is warm and dry. No rash noted. No erythema.  Psychiatric:  a normal mood and affect.  behavior is normal.    Lab results: Results for orders placed during the hospital encounter of 01/10/13 (from the past 24 hour(s))  URINALYSIS, ROUTINE W REFLEX MICROSCOPIC     Status: Abnormal   Collection Time    01/10/13  5:20 PM      Result Value Range   Color, Urine AMBER (*) YELLOW   APPearance HAZY (*) CLEAR   Specific Gravity, Urine 1.017  1.005 - 1.030   pH 5.5  5.0 - 8.0   Glucose, UA NEGATIVE  NEGATIVE mg/dL   Hgb urine dipstick SMALL (*) NEGATIVE   Bilirubin Urine LARGE (*) NEGATIVE   Ketones, ur 40 (*) NEGATIVE mg/dL   Protein, ur 30 (*) NEGATIVE mg/dL   Urobilinogen, UA 1.0  0.0 - 1.0 mg/dL   Nitrite NEGATIVE  NEGATIVE   Leukocytes, UA MODERATE (*) NEGATIVE  URINE MICROSCOPIC-ADD ON     Status: Abnormal   Collection Time    01/10/13  5:20 PM      Result Value Range   Squamous Epithelial / LPF FEW (*) RARE   WBC, UA 21-50  <3 WBC/hpf   RBC / HPF 0-2  <3 RBC/hpf   Bacteria, UA RARE  RARE   Urine-Other MUCOUS PRESENT    CULTURE, BLOOD (ROUTINE X 2)     Status: None   Collection Time    01/10/13  5:30 PM      Result Value Range   Specimen Description BLOOD ARM LEFT     Special Requests BOTTLES DRAWN AEROBIC AND ANAEROBIC 10CC     Culture  Setup Time       Value: 01/10/2013 23:26     Performed at [facility]   Culture       Value:        BLOOD CULTURE RECEIVED NO GROWTH TO DATE CULTURE WILL BE HELD FOR 5 DAYS BEFORE ISSUING A FINAL NEGATIVE REPORT     Performed at [facility]   Report Status PENDING    CULTURE, BLOOD (ROUTINE X 2)     Status: None   Collection Time    01/10/13  5:39 PM      Result Value Range  Specimen Description BLOOD HAND LEFT     Special Requests BOTTLES DRAWN AEROBIC ONLY 8CC     Culture  Setup Time       Value: 01/10/2013 23:26     Performed at [facility]   Culture       Value:        BLOOD CULTURE RECEIVED NO GROWTH TO DATE CULTURE WILL BE HELD FOR 5 DAYS BEFORE ISSUING A FINAL NEGATIVE REPORT     Performed at [facility]   Report Status PENDING    HIV ANTIBODY (ROUTINE TESTING)     Status: None   Collection Time    01/10/13 11:07 PM      Result Value Range   HIV NON REACTIVE  NON REACTIVE  MAGNESIUM     Status: Abnormal   Collection Time    01/10/13 11:07 PM      Result Value Range   Magnesium 1.2 (*) 1.5 - 2.5 mg/dL  LEGIONELLA ANTIGEN, URINE     Status: None   Collection Time    01/11/13 12:45 AM      Result Value Range   Specimen Description URINE, CATHETERIZED     Special Requests NONE     Legionella Antigen, Urine        Value: Negative for Legionella pneumophilia serogroup 1     Performed at [facility]   Report Status 01/11/2013 FINAL    STREP PNEUMONIAE URINARY ANTIGEN     Status: None   Collection Time    01/11/13 12:45 AM      Result Value Range   Strep Pneumo Urinary Antigen NEGATIVE  NEGATIVE  PROTIME-INR     Status: Abnormal   Collection Time    01/11/13  5:20 AM      Result Value Range   Prothrombin Time 29.2 (*) 11.6 - 15.2 seconds   INR 2.89 (*) 0.00 - 1.49  BASIC METABOLIC PANEL     Status: Abnormal   Collection Time    01/11/13  5:20 AM      Result Value Range   Sodium 137  135 - 145 mEq/L   Potassium 3.7  3.5 - 5.1 mEq/L   Chloride 104  96 - 112 mEq/L   CO2 22  19 - 32 mEq/L   Glucose, Bld 77  70 - 99 mg/dL   BUN 10  6 - 23 mg/dL   Creatinine, Ser 1.61  0.50 - 1.10 mg/dL   Calcium 8.0 (*) 8.4 - 10.5 mg/dL   GFR calc non Af Amer 89 (*) >90 mL/min   GFR calc Af Amer >90  >90 mL/min  CBC     Status: Abnormal   Collection Time    01/11/13  5:20 AM      Result Value Range   WBC 7.6  4.0 - 10.5 K/uL   RBC 3.89  3.87 - 5.11 MIL/uL   Hemoglobin 11.6 (*) 12.0 - 15.0 g/dL   HCT 09.6 (*) 04.5 - 40.9 %   MCV 90.2  78.0 - 100.0 fL   MCH 29.8  26.0 - 34.0 pg   MCHC 33.0  30.0 - 36.0 g/dL   RDW 81.1  91.4 - 78.2 %   Platelets 204  150 - 400 K/uL    Imaging results:  Dg Chest 2 View  01/10/2013   *RADIOLOGY REPORT*  Clinical Data: Chest pain, hypertension  CHEST - 2 VIEW  Comparison: 01/04/2013  Findings: Minimal enlargement of cardiac silhouette. Rotation to  the left. Mediastinal contours and pulmonary vascularity normal. Atherosclerotic calcification aorta. New small bilateral pleural effusions. Additionally there is consolidation in the left lower lobe, new. Question minimal infiltrate right mid lung. No pneumothorax. Bones demineralized.  IMPRESSION: Mild enlargement of cardiac silhouette. Bibasilar effusions with left lower lobe infiltrate and question minimal right mid  lung infiltrate.   Original Report Authenticated By: Ulyses Southward, M.D.   US Abdomen Complete  01/10/2013   *RADIOLOGY REPORT*  Clinical Data:  Right upper quadrant abdominal pain.  History of colon cancer.  COMPLETE ABDOMINAL ULTRASOUND  Comparison:  No priors.  Findings:  Gallbladder:  No shadowing gallstones or echogenic sludge.  No gallbladder wall thickening or pericholecystic fluid.  Negative sonographic Murphy's sign according to the ultrasound technologist.  Common bile duct:  Normal caliber measuring 4.9 mm in the porta hepatis.  Liver:  No focal mass lesion seen.  Within normal limits in parenchymal echogenicity.  IVC:  Patent throughout its visualized course in the abdomen.  Pancreas:  Although the pancreas is difficult to visualize in its entirety, no focal pancreatic abnormality is identified.  Spleen:  Normal size and echotexture without focal parenchymal abnormality.  6.6 cm in length.  Right Kidney:  No hydronephrosis.  Well-preserved cortex.  Normal size and parenchymal echotexture without focal abnormalities. 10.8 cm in length.  Left Kidney:  No hydronephrosis.  Well-preserved cortex.  Normal size and parenchymal echotexture without focal abnormalities. 9.7 cm in length.  Abdominal aorta:  Largely obscured by bowel gas distally, but normal caliber proximally measuring up to 2.0 cm in diameter.  Additional findings:  Bilateral pleural effusions incompletely evaluated.  IMPRESSION: 1.  No acute findings to account for the patient's symptoms. Specifically, no evidence of gallstones or findings to suggest acute cholecystitis at this time. 2.  Bilateral pleural effusions incidentally noted.   Original Report Authenticated By: Trudie Reed, M.D.   Ct Abdomen Pelvis W Contrast  01/10/2013   *RADIOLOGY REPORT*  Clinical Data: Weakness.  Nausea, vomiting and abdominal pain.  CT ABDOMEN AND PELVIS WITH CONTRAST  Technique:  Multidetector CT imaging of the abdomen and pelvis was performed following the  standard protocol during bolus administration of intravenous contrast.  Contrast: 80mL OMNIPAQUE IOHEXOL 300 MG/ML  SOLN  Comparison: No priors.  Findings:  Lung Bases: Moderate bilateral pleural effusions with associated passive atelectasis in the lower lobes of the lungs bilaterally. Peripheral ground-glass attenuation and some subpleural reticulation in the visualized lung bases.  A few thick-walled cysts are noted in the right lower lobe.  Small volume of pericardial fluid.  No associated pericardial calcification in the visualized portions of the thorax.  Heart size appears borderline enlarged.  Atherosclerotic calcification and in the right coronary artery.  There is a suggestion of a small filling defects in the left lateral aspect of the left atrium on image 1 of series 2, however, this is the first image of the study and this is incompletely visualized.  Abdomen/Pelvis:  The appearance of the liver, gallbladder, spleen and bilateral adrenal glands is unremarkable.  Pancreatic atrophy. No focal pancreatic lesions are noted.  Multifocal cortical thinning throughout the kidneys bilaterally, compatible with post infectious or inflammatory scarring.  Multiple subcentimeter low attenuation lesions are noted in the kidneys bilaterally but are too small to definitively characterize.  Extensive atherosclerosis throughout the abdominal and pelvic vasculature, without definite aneurysm or dissection.  Presacral fluid and thickening is of uncertain etiology and significance, but may reflect edema and/or inflammation.  Large amount of  well formed stool in the distal rectum.  The more proximal colon is otherwise relatively decompressed, and the overall stone burden appears normal.  Urinary bladder is completely decompressed with Foley balloon catheter in place.  Status post hysterectomy.  Ovaries are atrophic.  No significant volume of ascites.  No pneumoperitoneum. No pathologic distension of small bowel.  No definite  pathologic lymphadenopathy identified within the abdomen or pelvis on today's examination.  Musculoskeletal: There are no aggressive appearing lytic or blastic lesions noted in the visualized portions of the skeleton. Multilevel degenerative disc disease, most severe at L3-L4.  IMPRESSION:  1.  Large volume of well formed stool in the distal rectum.  This is nonspecific, but can be seen in the setting of fecal impaction. Given the lack of the more proximal colonic distension and overall normal stool burden, this is favored to be an incidental finding but clinical correlation is recommended, as there is a large amount of presacral fluid and thickening which may indicate some edema and/or inflammation. 2.  No other potential acute findings in the abdomen or pelvis to account for the patient's symptoms. 3.  Extensive atherosclerosis, including right coronary artery disease. 4.  Image #1 demonstrates a potential filling defect in the lateral aspect of the left atrium immediately posterior to the left atrioventricular groove.  This patient with history of atrial fibrillation, clinical correlation is recommended with consideration for further evaluation with echocardiography to exclude intracardiac thrombus. 5.  Moderate bilateral pleural effusions with associated passive atelectasis throughout the lower lobes of the lungs bilaterally. 6.  Unusual appearance of the lung parenchyma, as above, which could be indicative of an interstitial lung disease.  Follow-up high-resolution chest CT after the patient's acute illness has resolved is suggested to better assess these findings and characterize this potential interstitial lung disease. 7.  Small amount of pericardial fluid, unlikely to be of hemodynamic significance at this time. 8.  Additional incidental findings, as above.   Original Report Authenticated By: Trudie Reed, M.D.    Assessment and Plan: I have seen and evaluated the patient as outlined above. I agree  with the formulated Assessment and Plan as detailed in the residents' admission note, with the following changes:    Continue with HCAP treatment. Will discuss with family if they feel that they would be amenable for her to do SNF until she regains strength. Will also do calorie count, nutritional consultant to determine if she is meeting nutritional needs  Judyann Munson, MD 8/8/20144:06 PM

## 2013-01-11 NOTE — Progress Notes (Signed)
Subjective: Ashley Rubio was seen and examined by me this AM.  She is oriented to self and was pleasant and cooperative with the exam.  She denies CP/SOB, cough/fever/dysuria or decreased po intake.  However, family report that she has not been eating or drinking except for sips with meds.  Objective: Vital signs in last 24 hours: Filed Vitals:   01/11/13 0531 01/11/13 0832 01/11/13 0847 01/11/13 1343  BP: 151/76 141/52  152/76  Pulse: 84 95  78  Temp: 98.1 F (36.7 C) 98.2 F (36.8 C)  98.5 F (36.9 C)  TempSrc: Oral Oral  Oral  Resp: 18 17  17   Height:      Weight:      SpO2: 97% 98% 98% 99%   Weight change:   Intake/Output Summary (Last 24 hours) at 01/11/13 1459 Last data filed at 01/11/13 0646  Gross per 24 hour  Intake 1227.08 ml  Output    550 ml  Net 677.08 ml   General: resting in bed in NAD HEENT: PERRL, dry mucous membranes Cardiac: irregularly irregular rhythm  Pulm: clear to auscultation bilaterally but poor effort Abd: soft, nontender, nondistended, BS present Ext: RUE with decreased swelling since last admission Neuro: alert and oriented to self, following commands  Lab Results: Basic Metabolic Panel:  Recent Labs Lab 01/10/13 1524 01/10/13 2307 01/11/13 0520  NA 139  --  137  K 3.4*  --  3.7  CL 102  --  104  CO2 23  --  22  GLUCOSE 82  --  77  BUN 12  --  10  CREATININE 0.68  --  0.55  CALCIUM 8.5  --  8.0*  MG  --  1.2*  --    Liver Function Tests:  Recent Labs Lab 01/04/13 1650 01/10/13 1524  AST 17 18  ALT 10 11  ALKPHOS 94 96  BILITOT 1.6* 0.9  PROT 6.1 5.9*  ALBUMIN 2.8* 2.3*   CBC:  Recent Labs Lab 01/08/13 0500 01/10/13 1524 01/11/13 0520  WBC 7.0 10.3 7.6  NEUTROABS 5.5 8.6*  --   HGB 12.1 13.5 11.6*  HCT 35.8* 39.6 35.1*  MCV 91.3 90.4 90.2  PLT 203 228 204   Cardiac Enzymes:  Recent Labs Lab 01/05/13 0007 01/10/13 1524  TROPONINI <0.30 <0.30    Coagulation:  Recent Labs Lab 01/07/13 0410  01/08/13 0500 01/10/13 1524 01/11/13 0520  LABPROT 20.3* 22.5* 28.0* 29.2*  INR 1.79* 2.05* 2.73* 2.89*   Urinalysis:  Recent Labs Lab 01/04/13 1751 01/10/13 1720  COLORURINE ORANGE* AMBER*  LABSPEC 1.020 1.017  PHURINE 6.0 5.5  GLUCOSEU NEGATIVE NEGATIVE  HGBUR NEGATIVE SMALL*  BILIRUBINUR MODERATE* LARGE*  KETONESUR 40* 40*  PROTEINUR 30* 30*  UROBILINOGEN 2.0* 1.0  NITRITE NEGATIVE NEGATIVE  LEUKOCYTESUR MODERATE* MODERATE*   Micro Results: Recent Results (from the past 240 hour(s))  URINE CULTURE     Status: None   Collection Time    01/04/13  5:51 PM      Result Value Range Status   Specimen Description URINE, CATHETERIZED   Final   Special Requests NONE   Final   Culture  Setup Time 01/04/2013 22:48   Final   Colony Count >=100,000 COLONIES/ML   Final   Culture     Final   Value: Multiple bacterial morphotypes present, none predominant. Suggest appropriate recollection if clinically indicated.   Report Status 01/05/2013 FINAL   Final  CULTURE, BLOOD (ROUTINE X 2)     Status:  None   Collection Time    01/04/13  6:20 PM      Result Value Range Status   Specimen Description BLOOD ARM LEFT   Final   Special Requests BOTTLES DRAWN AEROBIC ONLY 5CC   Final   Culture  Setup Time     Final   Value: 01/04/2013 22:23     Performed at [facility]   Culture     Final   Value: NO GROWTH 5 DAYS     Performed at [facility]   Report Status 01/10/2013 FINAL   Final  CULTURE, BLOOD (ROUTINE X 2)     Status: None   Collection Time    01/04/13  6:28 PM      Result Value Range Status   Specimen Description BLOOD HAND LEFT   Final   Special Requests BOTTLES DRAWN AEROBIC ONLY 1CC   Final   Culture  Setup Time     Final   Value: 01/05/2013 01:32     Performed at [facility]   Culture     Final   Value: NO GROWTH 5 DAYS     Performed at [facility]   Report Status 01/11/2013 FINAL   Final  URINE CULTURE     Status: None   Collection  Time    01/06/13  2:13 PM      Result Value Range Status   Specimen Description URINE, CATHETERIZED   Final   Special Requests NONE   Final   Culture  Setup Time     Final   Value: 01/06/2013 21:58     Performed at [facility] Count     Final   Value: >=100,000 COLONIES/ML     Performed at [facility]   Culture     Final   Value: YEAST     Performed at [facility]   Report Status 01/07/2013 FINAL   Final  CULTURE, BLOOD (ROUTINE X 2)     Status: None   Collection Time    01/10/13  5:30 PM      Result Value Range Status   Specimen Description BLOOD ARM LEFT   Final   Special Requests BOTTLES DRAWN AEROBIC AND ANAEROBIC 10CC   Final   Culture  Setup Time     Final   Value: 01/10/2013 23:26     Performed at [facility]   Culture     Final   Value:        BLOOD CULTURE RECEIVED NO GROWTH TO DATE CULTURE WILL BE HELD FOR 5 DAYS BEFORE ISSUING A FINAL NEGATIVE REPORT     Performed at [facility]   Report Status PENDING   Incomplete  CULTURE, BLOOD (ROUTINE X 2)     Status: None   Collection Time    01/10/13  5:39 PM      Result Value Range Status   Specimen Description BLOOD HAND LEFT   Final   Special Requests BOTTLES DRAWN AEROBIC ONLY 8CC   Final   Culture  Setup Time     Final   Value: 01/10/2013 23:26     Performed at [facility]   Culture     Final   Value:        BLOOD CULTURE RECEIVED NO GROWTH TO DATE CULTURE WILL BE HELD FOR 5 DAYS BEFORE ISSUING A FINAL NEGATIVE REPORT     Performed at [facility]  Report Status PENDING   Incomplete   Studies/Results: Dg Chest 2 View  01/10/2013   *RADIOLOGY REPORT*  Clinical Data: Chest pain, hypertension  CHEST - 2 VIEW  Comparison: 01/04/2013  Findings: Minimal enlargement of cardiac silhouette. Rotation to the left. Mediastinal contours and pulmonary vascularity normal. Atherosclerotic calcification aorta. New small bilateral pleural effusions. Additionally  there is consolidation in the left lower lobe, new. Question minimal infiltrate right mid lung. No pneumothorax. Bones demineralized.  IMPRESSION: Mild enlargement of cardiac silhouette. Bibasilar effusions with left lower lobe infiltrate and question minimal right mid lung infiltrate.   Original Report Authenticated By: Ulyses Southward, M.D.   US Abdomen Complete  01/10/2013   *RADIOLOGY REPORT*  Clinical Data:  Right upper quadrant abdominal pain.  History of colon cancer.  COMPLETE ABDOMINAL ULTRASOUND  Comparison:  No priors.  Findings:  Gallbladder:  No shadowing gallstones or echogenic sludge.  No gallbladder wall thickening or pericholecystic fluid.  Negative sonographic Murphy's sign according to the ultrasound technologist.  Common bile duct:  Normal caliber measuring 4.9 mm in the porta hepatis.  Liver:  No focal mass lesion seen.  Within normal limits in parenchymal echogenicity.  IVC:  Patent throughout its visualized course in the abdomen.  Pancreas:  Although the pancreas is difficult to visualize in its entirety, no focal pancreatic abnormality is identified.  Spleen:  Normal size and echotexture without focal parenchymal abnormality.  6.6 cm in length.  Right Kidney:  No hydronephrosis.  Well-preserved cortex.  Normal size and parenchymal echotexture without focal abnormalities. 10.8 cm in length.  Left Kidney:  No hydronephrosis.  Well-preserved cortex.  Normal size and parenchymal echotexture without focal abnormalities. 9.7 cm in length.  Abdominal aorta:  Largely obscured by bowel gas distally, but normal caliber proximally measuring up to 2.0 cm in diameter.  Additional findings:  Bilateral pleural effusions incompletely evaluated.  IMPRESSION: 1.  No acute findings to account for the patient's symptoms. Specifically, no evidence of gallstones or findings to suggest acute cholecystitis at this time. 2.  Bilateral pleural effusions incidentally noted.   Original Report Authenticated By: Trudie Reed, M.D.   Ct Abdomen Pelvis W Contrast  01/10/2013   *RADIOLOGY REPORT*  Clinical Data: Weakness.  Nausea, vomiting and abdominal pain.  CT ABDOMEN AND PELVIS WITH CONTRAST  Technique:  Multidetector CT imaging of the abdomen and pelvis was performed following the standard protocol during bolus administration of intravenous contrast.  Contrast: 80mL OMNIPAQUE IOHEXOL 300 MG/ML  SOLN  Comparison: No priors.  Findings:  Lung Bases: Moderate bilateral pleural effusions with associated passive atelectasis in the lower lobes of the lungs bilaterally. Peripheral ground-glass attenuation and some subpleural reticulation in the visualized lung bases.  A few thick-walled cysts are noted in the right lower lobe.  Small volume of pericardial fluid.  No associated pericardial calcification in the visualized portions of the thorax.  Heart size appears borderline enlarged.  Atherosclerotic calcification and in the right coronary artery.  There is a suggestion of a small filling defects in the left lateral aspect of the left atrium on image 1 of series 2, however, this is the first image of the study and this is incompletely visualized.  Abdomen/Pelvis:  The appearance of the liver, gallbladder, spleen and bilateral adrenal glands is unremarkable.  Pancreatic atrophy. No focal pancreatic lesions are noted.  Multifocal cortical thinning throughout the kidneys bilaterally, compatible with post infectious or inflammatory scarring.  Multiple subcentimeter low attenuation lesions are noted in the kidneys bilaterally  but are too small to definitively characterize.  Extensive atherosclerosis throughout the abdominal and pelvic vasculature, without definite aneurysm or dissection.  Presacral fluid and thickening is of uncertain etiology and significance, but may reflect edema and/or inflammation.  Large amount of well formed stool in the distal rectum.  The more proximal colon is otherwise relatively decompressed, and the overall  stone burden appears normal.  Urinary bladder is completely decompressed with Foley balloon catheter in place.  Status post hysterectomy.  Ovaries are atrophic.  No significant volume of ascites.  No pneumoperitoneum. No pathologic distension of small bowel.  No definite pathologic lymphadenopathy identified within the abdomen or pelvis on today's examination.  Musculoskeletal: There are no aggressive appearing lytic or blastic lesions noted in the visualized portions of the skeleton. Multilevel degenerative disc disease, most severe at L3-L4.  IMPRESSION:  1.  Large volume of well formed stool in the distal rectum.  This is nonspecific, but can be seen in the setting of fecal impaction. Given the lack of the more proximal colonic distension and overall normal stool burden, this is favored to be an incidental finding but clinical correlation is recommended, as there is a large amount of presacral fluid and thickening which may indicate some edema and/or inflammation. 2.  No other potential acute findings in the abdomen or pelvis to account for the patient's symptoms. 3.  Extensive atherosclerosis, including right coronary artery disease. 4.  Image #1 demonstrates a potential filling defect in the lateral aspect of the left atrium immediately posterior to the left atrioventricular groove.  This patient with history of atrial fibrillation, clinical correlation is recommended with consideration for further evaluation with echocardiography to exclude intracardiac thrombus. 5.  Moderate bilateral pleural effusions with associated passive atelectasis throughout the lower lobes of the lungs bilaterally. 6.  Unusual appearance of the lung parenchyma, as above, which could be indicative of an interstitial lung disease.  Follow-up high-resolution chest CT after the patient's acute illness has resolved is suggested to better assess these findings and characterize this potential interstitial lung disease. 7.  Small amount of  pericardial fluid, unlikely to be of hemodynamic significance at this time. 8.  Additional incidental findings, as above.   Original Report Authenticated By: Trudie Reed, M.D.   Medications: I have reviewed the patient's current medications. Scheduled Meds: . antiseptic oral rinse  15 mL Mouth Rinse q12n4p  . aztreonam  2 g Intravenous Q8H  . budesonide-formoterol  2 puff Inhalation BID  . chlorhexidine  15 mL Mouth Rinse BID  . vancomycin  500 mg Intravenous Q12H   Continuous Infusions: . 0.9 % NaCl with KCl 40 mEq / L 125 mL/hr at 01/11/13 1137   PRN Meds:.  Assessment/Plan: Ashley Rubio is a 77 year old female with past medical history of atrial fibrillation on Coumadin, digoxin and metoprolol; hypertension; and a recent hospitalization here at [facility] for urinary tract infection with altered mental status, who presents today with a chief complaint of diffuse weakness and productive cough, found to have a left lower lobe infiltrate on chest x-ray.   #Health care-acquired pneumonia - Patient with new cough, sore throat, and crackles on exam. Chest x-ray shows left lower lobe infiltrate. Bilateral pleural effusions noted on abdominal ultrasound and chest CT. This clinical picture most likely represents a health care-acquired pneumonia given her many recent hospitalizations. Though not a typical location for an aspiration pneumonia and she has reportedly been NPO x2 days, aspiration is still a legitimate concern in this patient given  altered mental status, weakness, crusted oral secretions on physical exam.  - Aztreonam and vancomycin IV for HCAP (anaphylaxis to penicillin)  - Follow-up blood cultures, sputum culture and gram stain - Negative for strep pneumo urine antigen, legionella urine antigen and HIV - SLP complete today and recommend Dysphagia 1 diet (puree with nectar thick liquid)  #Flank pain, decreased appetite - Abdominal exam unremarkable and pain not reproducable. Urine  culture from 01/06/13 resulted recently showing >100,000 col/ml yeast. CT abdomen this evening showed a large volume of well formed stool in the distal rectum, incidental vs. fecal impaction.  - Repeat urine culture pending - Consider disimpaction if she develops new abdominal pain   #Diffuse weakness - Likely multifactorial as patient is deconditioned from multiple recent hospital stays, has infection (HCAP) and meets criteria for severe malnutrition.  No signs of stroke on CT or focal neurologic deficits.   - NS infusion @125ml /her  - Treating HCAP as above  - Nutrition and SLP consulted and recommendations appreciated - Diet - Dysphagia 1 (puree) and nectar thick liquids, with added supplement - Will continue to monitor electrolytes and replete as necessary  #Delirium/encephalopathy - Stable since discharge. She remains confused and disoriented, but alert and non-agitated. Likely multifactorial etiology for same reasons as above. She probably has undergone a worsening of her underlying dementia and will not return to her baseline of living independently, and family is aware of this. Subacute stroke is important to consider given history of stepwise decline, especially with the suggestion of left atrial filling defect on CT chest. Not currently agitated or delirious.  - Consider brain MRI  - Will resume Aricept   #Atrial fibrillation - Patient is therapeutic on coumadin (INR = 2.89). Possible small left atrial filling defect on CT.  HR bradycardic to 45 yesterday. - Continue telemetry  - Coumadin per pharmacy   - Holding metoprolol, digoxin as pt was bradycardic yesterday. PR currently 70s.  If develops tachycardia will add beta-blocker and titrate to goal.  - Echo today  #HTN - BP up to 152/76 today.  - Resume lisinopril  #COPD - Stable  - Continue Symbicort inhaler   #Hypokalemia - Resolved, 3.7.  Likely secondary to decreased po intake.  - Mg 1.2, will replete - AM BMP  #Heart  murmer - Soft holosystolic murmur best heard at the apex. History of mitral regurgitation. Afebrile. No stigmata of endocarditis on exam.  - Follow-up blood cultures and echo  #DVT PPX - coumadin  Dispo: Disposition is deferred at this time, awaiting improvement of current medical problems.  Anticipated discharge in approximately 2-3 day(s).   The patient does have a current PCP (S Gillermo Murdoch, MD) and does need an [facility] hospital follow-up appointment after discharge.  The patient does not have transportation limitations that hinder transportation to clinic appointments.  .Services Needed at time of discharge: Y = Yes, Blank = No PT:   OT:   RN:   Equipment:   Other:     LOS: 1 day   Evelena Peat, DO 01/11/2013, 2:59 PM

## 2013-01-11 NOTE — Progress Notes (Signed)
Admission note:   Arrival Method: Stretcher from ED. Mental Status: Oriented to person only. Telemetry: N/A  Skin: St. 2 on right buttock and st 1 on sacrum/coccyx - foam dressing applied. Skin tear on left forearm - foam dressing applied. Heels reddened, but blanchable. RUE edematous.  Tubes: Foley catheter placed in ED.  IV: LAC 20G 01/10/13 IVF infusing.  Pain: Denies.  Family: At bedside. Safety Measures: Fall safety plan, yellow armband, red socks, bed alarm in place, call bell within reach. 6E Orientation: Attempted to orient to unit, but pt is very confused.

## 2013-01-11 NOTE — Progress Notes (Signed)
ANTICOAGULATION CONSULT NOTE - Follow Up Consult  Pharmacy Consult for Coumadin Indication: atrial fibrillation  Allergies  Allergen Reactions  . Penicillins Anaphylaxis and Swelling  . Ambien (Zolpidem Tartrate)     tachycardia    . Aspirin Nausea And Vomiting  . Codeine Itching  . Tartrazine Nausea Only    Patient Measurements: Height: 5\' 6"  (167.6 cm) Weight: 141 lb 15.6 oz (64.4 kg) IBW/kg (Calculated) : 59.3  Vital Signs: Temp: 98.5 F (36.9 C) (08/08 1343) Temp src: Oral (08/08 1343) BP: 152/76 mmHg (08/08 1343) Pulse Rate: 78 (08/08 1343)  Labs:  Recent Labs  01/10/13 1524 01/11/13 0520  HGB 13.5 11.6*  HCT 39.6 35.1*  PLT 228 204  APTT 62*  --   LABPROT 28.0* 29.2*  INR 2.73* 2.89*  CREATININE 0.68 0.55  TROPONINI <0.30  --     Estimated Creatinine Clearance: 56.9 ml/min (by C-G formula based on Cr of 0.55).   Medications:  Scheduled:  . antiseptic oral rinse  15 mL Mouth Rinse q12n4p  . aztreonam  2 g Intravenous Q8H  . budesonide-formoterol  2 puff Inhalation BID  . chlorhexidine  15 mL Mouth Rinse BID  . vancomycin  500 mg Intravenous Q12H    Assessment: 77 yo F admitted with AMS and UTI.  To continue Coumadin for hx Afib.  INR therapeutic on admission.  Of note, pt currently NPO and decreased PO intake can increase the effects of Coumadin.  Also noted, pt did not receive Coumadin dose ordered for 8/7 PM.  Medication was discontinued by MD before administered.  Goal of Therapy:  INR 2-3 Monitor platelets by anticoagulation protocol: Yes   Plan:  Coumadin 2 mg PO x 1 tonight. (Ok to crush per SLP recs). Call pharmacy if IV dose of Coumadin needed instead. Continue daily INR.  [employer], Pharm.D., BCPS Clinical Pharmacist Pager [phone] 01/11/2013 4:02 PM

## 2013-01-11 NOTE — Evaluation (Signed)
Clinical/Bedside Swallow Evaluation Patient Details  Name: Ashley Rubio MRN: [id number] Date of Birth: 12-Sep-1935  Today's Date: 01/11/2013 Time: 4098-1191 SLP Time Calculation (min): 40 min  Past Medical History:  Past Medical History  Diagnosis Date  . Hypertension   . Unspecified venous (peripheral) insufficiency   . Personal history of malignant neoplasm of large intestine   . Cancer     colon  . Varicose veins of lower extremities with other complications    Past Surgical History:  Past Surgical History  Procedure Laterality Date  . Neck surgery  2003  . Tongue biopsy  2003  . Abdominal hysterectomy    . Breast biopsy    . Colonoscopy    . Gastric perforation repair     . Cardioversion    . Leg surgery Left    HPI:  Ashley Rubio was recently discharged from our hospital for encephalopathy due to presumed UTI that worsened her underlying dementia. She was reported to becoming increasingly weak at home with poor po intake. She was readmitted for dehydration, HCAP due to LLL infiltrate.   Assessment / Plan / Recommendation Clinical Impression  Patient presents with a mild oral and mild-moderate pharyngeal dysphagia resulting in poor control of tested boluses, suspected delayed swallow initiation, and observed delayed cough and throat clear after swallows of thin liquids. Patient c/o sensation "i get strangled...it feels like its going down crooked". Patient exhibited dry phlegm on corners of lips, as well as along gumline and on dentures. SLP assisted patient with oral care to remove majority of stasis of secretions/phlegm from oral cavity prior to administering P.O.'s.     Aspiration Risk       Diet Recommendation Dysphagia 1 (Puree);Nectar-thick liquid   Liquid Administration via: Cup Medication Administration: Crushed with puree Supervision: Full supervision/cueing for compensatory strategies Compensations: Slow rate;Small sips/bites;Check for pocketing Postural  Changes and/or Swallow Maneuvers: Seated upright 90 degrees    Other  Recommendations Recommended Consults: MBS (when pt. is accepting solid P.O.'s, MBSS prior to upgrade. ) Oral Care Recommendations: Oral care BID Other Recommendations: Order thickener from pharmacy   Follow Up Recommendations  Skilled Nursing facility;24 hour supervision/assistance    Frequency and Duration min 2x/week  2 weeks   Pertinent Vitals/Pain     SLP Swallow Goals     Swallow Study Prior Functional Status       General HPI: Ashley Rubio was recently discharged from our hospital for encephalopathy due to presumed UTI that worsened her underlying dementia. She was reported to becoming increasingly weak at home with poor po intake. She was readmitted for dehydration, HCAP due to LLL infiltrate. Type of Study: Bedside swallow evaluation Previous Swallow Assessment: N/A Diet Prior to this Study: NPO Temperature Spikes Noted: No Respiratory Status: Room air History of Recent Intubation: No Behavior/Cognition: Alert;Cooperative;Requires cueing;Confused Oral Cavity - Dentition: Dentures, top;Dentures, bottom Self-Feeding Abilities: Needs assist;Needs set up Patient Positioning: Upright in bed Baseline Vocal Quality: Clear Volitional Cough: Congested Volitional Swallow: Able to elicit    Oral/Motor/Sensory Function Labial ROM: Within Functional Limits Labial Symmetry: Within Functional Limits Labial Strength: Reduced Lingual ROM: Within Functional Limits Lingual Symmetry: Within Functional Limits Lingual Strength: Reduced Facial Symmetry: Within Functional Limits Facial Strength: Reduced   Ice Chips Ice chips: Not tested   Thin Liquid Thin Liquid: Impaired Presentation: Cup Pharyngeal  Phase Impairments: Suspected delayed Swallow;Cough - Delayed;Throat Clearing - Delayed    Nectar Thick Nectar Thick Liquid: Impaired Pharyngeal Phase Impairments: Suspected delayed Swallow Other Comments: No  overt  s/s aspiration with controlled cup sips of nectar thick liquids   Honey Thick Honey Thick Liquid: Not tested   Puree Puree: Not tested Other Comments: Patient refused any puree solids offered. ("it will make me feel sick")   Solid   GO    Solid: Not tested (pt. refused any solid textures.)       Ashley Rubio Ashley Rubio 01/11/2013,5:07 PM  Angela Nevin, MA, CCC-SLP [facility] Speech-Language Pathologist

## 2013-01-11 NOTE — Progress Notes (Signed)
Patient agitated, stating that she is going home, attempting to get out of bed.  Unable to reassure or calm patient.  MD notified.  Will give 1 mg Haldol IV per MD order

## 2013-01-11 NOTE — Progress Notes (Signed)
  Echocardiogram 2D Echocardiogram has been performed.  [place] 01/11/2013, 2:10 PM

## 2013-01-12 LAB — CBC WITH DIFFERENTIAL/PLATELET
HCT: 33.8 % — ABNORMAL LOW (ref 36.0–46.0)
Hemoglobin: 11.6 g/dL — ABNORMAL LOW (ref 12.0–15.0)
Lymphocytes Relative: 12 % (ref 12–46)
Monocytes Absolute: 0.6 10*3/uL (ref 0.1–1.0)
Monocytes Relative: 8 % (ref 3–12)
Neutro Abs: 5.7 10*3/uL (ref 1.7–7.7)
WBC: 7.3 10*3/uL (ref 4.0–10.5)

## 2013-01-12 LAB — BASIC METABOLIC PANEL
BUN: 9 mg/dL (ref 6–23)
CO2: 21 mEq/L (ref 19–32)
Chloride: 105 mEq/L (ref 96–112)
Creatinine, Ser: 0.51 mg/dL (ref 0.50–1.10)
Glucose, Bld: 81 mg/dL (ref 70–99)

## 2013-01-12 LAB — URINE CULTURE

## 2013-01-12 LAB — PROTIME-INR: INR: 3.16 — ABNORMAL HIGH (ref 0.00–1.49)

## 2013-01-12 MED ORDER — WHITE PETROLATUM GEL
Status: AC
  Administered 2013-01-12: 0.2
  Filled 2013-01-12: qty 5

## 2013-01-12 NOTE — Evaluation (Signed)
Occupational Therapy Evaluation Patient Details Name: Ashley Rubio MRN: [id number] DOB: 1936/01/01 Today's Date: 01/12/2013 Time: 7829-5621 OT Time Calculation (min): 28 min  OT Assessment / Plan / Recommendation History of present illness 77yo F with afbi who previously was independently living on her own until June where she has had significant decline in her health, has had 2 hospitalizations at [facility] one for UTI, she was recently placed on digoxin as well as haldol by SNF when she was discharged to home. Now presents with lethargy, altered mental status, suprapubic pain. AMS likely due to several factors including infection +/- drug side effects from digoxin and haldol   Clinical Impression   Pt admitted with above.  Pt limited by fatigue and became agitated with bed mobility and assist for self care after having BM in bed.  Will continue to follow acutely in order to address below problem list. Recommending SNF for d/c planning.    OT Assessment  Patient needs continued OT Services    Follow Up Recommendations  SNF    Barriers to Discharge      Equipment Recommendations  None recommended by OT    Recommendations for Other Services    Frequency  Min 2X/week    Precautions / Restrictions Precautions Precautions: Fall   Pertinent Vitals/Pain See vitals    ADL  Upper Body Bathing: Simulated;Maximal assistance Where Assessed - Upper Body Bathing: Supine, head of bed up Lower Body Bathing: Performed;+1 Total assistance Where Assessed - Lower Body Bathing: Supine, head of bed up;Rolling right and/or left Upper Body Dressing: Simulated;Maximal assistance Where Assessed - Upper Body Dressing: Supported sitting Lower Body Dressing: Simulated;+1 Total assistance Where Assessed - Lower Body Dressing: Supine, head of bed up;Rolling right and/or left ADL Comments: Pt with loose stool while laying in bed upon PT/OT arrival.  Pt unable to assist with self care and peri care due to being  agitated with bed mobility.      OT Diagnosis: Generalized weakness;Cognitive deficits  OT Problem List: Decreased strength;Decreased activity tolerance;Impaired balance (sitting and/or standing);Decreased cognition;Decreased knowledge of use of DME or AE OT Treatment Interventions: Self-care/ADL training;Therapeutic exercise;DME and/or AE instruction;Therapeutic activities;Patient/family education;Balance training;Cognitive remediation/compensation   OT Goals(Current goals can be found in the care plan section) Acute Rehab OT Goals Patient Stated Goal: Did not set OT Goal Formulation: Patient unable to participate in goal setting Time For Goal Achievement: 01/26/13 Potential to Achieve Goals: Fair  Visit Information  Last OT Received On: 01/12/13 Assistance Needed: +2 History of Present Illness: 77yo F with afbi who previously was independently living on her own until June where she has had significant decline in her health, has had 2 hospitalizations at [facility] one for UTI, she was recently placed on digoxin as well as haldol by SNF when she was discharged to home. Now presents with lethargy, altered mental status, suprapubic pain. AMS likely due to several factors including infection +/- drug side effects from digoxin and haldol       Prior Functioning     Home Living Family/patient expects to be discharged to:: Private residence Living Arrangements: Children Available Help at Discharge: Family Type of Home: House Home Access: Stairs to enter Home Layout: One level Home Equipment: Environmental consultant - 2 wheels;Bedside commode;Shower seat Additional Comments: Information from OT/PT note from last admission. Pt unable to provide information. Prior Function Level of Independence: Needs assistance Gait / Transfers Assistance Needed: Was amb for 1-2 days at son's home before declining again and son was having to  carry her. ADL's / Homemaking Assistance Needed: Son assisting with most  adls. Communication Communication: No difficulties Dominant Hand: Right         Vision/Perception     Cognition  Cognition Arousal/Alertness: Lethargic Behavior During Therapy: Agitated Overall Cognitive Status: No family/caregiver present to determine baseline cognitive functioning Area of Impairment: Orientation;Attention;Following commands;Problem solving Orientation Level: Disoriented to;Place;Time;Situation Current Attention Level: Focused Memory: Decreased short-term memory Following Commands: Follows one step commands inconsistently;Follows one step commands with increased time Problem Solving: Slow processing;Decreased initiation;Difficulty sequencing;Requires verbal cues;Requires tactile cues    Extremity/Trunk Assessment Upper Extremity Assessment Upper Extremity Assessment: Generalized weakness;Difficult to assess due to impaired cognition Lower Extremity Assessment Lower Extremity Assessment: Generalized weakness     Mobility Bed Mobility Bed Mobility: Rolling Right;Rolling Left;Left Sidelying to Sit Rolling Right: 1: +2 Total assist;With rail Rolling Right: Patient Percentage: 0% Rolling Left: 1: +2 Total assist;With rail Rolling Left: Patient Percentage: 0% Left Sidelying to Sit: 1: +2 Total assist;With rails Left Sidelying to Sit: Patient Percentage: 10% Supine to Sit: 1: +2 Total assist Supine to Sit: Patient Percentage: 10% Details for Bed Mobility Assistance: +2 (A) with all mobility resiting movement at times.  Max cues for proper technique     Exercise     Balance Balance Balance Assessed: Yes Static Sitting Balance Static Sitting - Balance Support: Bilateral upper extremity supported;Feet supported Static Sitting - Level of Assistance: 1: +2 Total assist Static Sitting - Comment/# of Minutes: +2 (A) to maintain balance and promote midline due to severe left sided lean with occasional attempts to return to supine.   End of Session OT - End of  Session Activity Tolerance: Patient limited by fatigue Patient left: in bed;with call bell/phone within reach;with bed alarm set Nurse Communication: Mobility status  GO   01/12/2013 Cipriano Mile OTR/L Pager [phone] Office [phone]   Cipriano Mile 01/12/2013, 11:03 AM

## 2013-01-12 NOTE — Progress Notes (Signed)
Patient ID: Ashley Rubio, female   DOB: [birth year], 77 y.o.   MRN: [id number]   Subjective: According to the nursing note from last night, Ashley Rubio became agitated and was given Haldol 1 mg IV. According to the patient, she did not sleep very well last night. She has no appetite this morning and has not had a bowel movement today. No chest pain, shortness of breath, or dysuria.  Objective: Vital signs in last 24 hours: Filed Vitals:   01/12/13 0607 01/12/13 0857 01/12/13 0932 01/12/13 1309  BP:   136/83 149/86  Pulse: 95  44 72  Temp:   98.1 F (36.7 C) 98.6 F (37 C)  TempSrc:      Resp:   18 18  Height:      Weight:      SpO2:  98% 96% 98%   Weight change: 5 kg (11 lb 0.4 oz)  Intake/Output Summary (Last 24 hours) at 01/12/13 1333 Last data filed at 01/12/13 1310  Gross per 24 hour  Intake 1156.25 ml  Output   1151 ml  Net   5.25 ml   Physical Exam  HENT:  Head: Normocephalic and atraumatic.  Mouth/Throat: Mucous membranes are dry.  Eyes: Pupils are equal, round, and reactive to light.  Cardiovascular: An irregularly irregular rhythm present. Exam reveals no gallop and no friction rub.   No murmur heard. Pulmonary/Chest: No respiratory distress.  Coarse breath sounds with expiration on left side  Abdominal: Soft. Bowel sounds are normal.  Musculoskeletal:       Right wrist: She exhibits swelling.       Arms: Neurological: She is alert.  Oriented to person only  Skin: Skin is dry.     Lab Results: CBC    Component Value Date/Time   WBC 7.3 01/12/2013 0420   RBC 3.77* 01/12/2013 0420   HGB 11.6* 01/12/2013 0420   HCT 33.8* 01/12/2013 0420   PLT 206 01/12/2013 0420   MCV 89.7 01/12/2013 0420   MCH 30.8 01/12/2013 0420   MCHC 34.3 01/12/2013 0420   RDW 13.8 01/12/2013 0420   LYMPHSABS 0.9 01/12/2013 0420   MONOABS 0.6 01/12/2013 0420   EOSABS 0.1 01/12/2013 0420   BASOSABS 0.0 01/12/2013 0420    CMP     Component Value Date/Time   NA 136 01/12/2013 0420   K 4.2 01/12/2013  0420   CL 105 01/12/2013 0420   CO2 21 01/12/2013 0420   GLUCOSE 81 01/12/2013 0420   BUN 9 01/12/2013 0420   CREATININE 0.51 01/12/2013 0420   CALCIUM 8.1* 01/12/2013 0420   PROT 5.9* 01/10/2013 1524   ALBUMIN 2.3* 01/10/2013 1524   AST 18 01/10/2013 1524   ALT 11 01/10/2013 1524   ALKPHOS 96 01/10/2013 1524   BILITOT 0.9 01/10/2013 1524   GFRNONAA >90 01/12/2013 0420   GFRAA >90 01/12/2013 0420     Micro Results: Recent Results (from the past 240 hour(s))  URINE CULTURE     Status: None   Collection Time    01/04/13  5:51 PM      Result Value Range Status   Specimen Description URINE, CATHETERIZED   Final   Special Requests NONE   Final   Culture  Setup Time 01/04/2013 22:48   Final   Colony Count >=100,000 COLONIES/ML   Final   Culture     Final   Value: Multiple bacterial morphotypes present, none predominant. Suggest appropriate recollection if clinically indicated.   Report Status 01/05/2013 FINAL  Final  CULTURE, BLOOD (ROUTINE X 2)     Status: None   Collection Time    01/04/13  6:20 PM      Result Value Range Status   Specimen Description BLOOD ARM LEFT   Final   Special Requests BOTTLES DRAWN AEROBIC ONLY 5CC   Final   Culture  Setup Time     Final   Value: 01/04/2013 22:23     Performed at [facility]   Culture     Final   Value: NO GROWTH 5 DAYS     Performed at [facility]   Report Status 01/10/2013 FINAL   Final  CULTURE, BLOOD (ROUTINE X 2)     Status: None   Collection Time    01/04/13  6:28 PM      Result Value Range Status   Specimen Description BLOOD HAND LEFT   Final   Special Requests BOTTLES DRAWN AEROBIC ONLY 1CC   Final   Culture  Setup Time     Final   Value: 01/05/2013 01:32     Performed at [facility]   Culture     Final   Value: NO GROWTH 5 DAYS     Performed at [facility]   Report Status 01/11/2013 FINAL   Final  URINE CULTURE     Status: None   Collection Time    01/06/13  2:13 PM      Result Value Range Status    Specimen Description URINE, CATHETERIZED   Final   Special Requests NONE   Final   Culture  Setup Time     Final   Value: 01/06/2013 21:58     Performed at [facility] Count     Final   Value: >=100,000 COLONIES/ML     Performed at [facility]   Culture     Final   Value: YEAST     Performed at [facility]   Report Status 01/07/2013 FINAL   Final  URINE CULTURE     Status: None   Collection Time    01/10/13  5:20 PM      Result Value Range Status   Specimen Description URINE, CATHETERIZED   Final   Special Requests NONE   Final   Culture  Setup Time     Final   Value: 01/10/2013 19:10     Performed at [facility] Count     Final   Value: 75,000 COLONIES/ML     Performed at [facility]   Culture     Final   Value: ENTEROCOCCUS SPECIES     Performed at [facility]   Report Status 01/12/2013 FINAL   Final   Organism ID, Bacteria ENTEROCOCCUS SPECIES   Final  CULTURE, BLOOD (ROUTINE X 2)     Status: None   Collection Time    01/10/13  5:30 PM      Result Value Range Status   Specimen Description BLOOD ARM LEFT   Final   Special Requests BOTTLES DRAWN AEROBIC AND ANAEROBIC 10CC   Final   Culture  Setup Time     Final   Value: 01/10/2013 23:26     Performed at [facility]   Culture     Final   Value:        BLOOD CULTURE RECEIVED NO GROWTH TO DATE CULTURE WILL BE HELD FOR 5 DAYS BEFORE ISSUING A  FINAL NEGATIVE REPORT     Performed at [facility]   Report Status PENDING   Incomplete  CULTURE, BLOOD (ROUTINE X 2)     Status: None   Collection Time    01/10/13  5:39 PM      Result Value Range Status   Specimen Description BLOOD HAND LEFT   Final   Special Requests BOTTLES DRAWN AEROBIC ONLY 8CC   Final   Culture  Setup Time     Final   Value: 01/10/2013 23:26     Performed at [facility]   Culture     Final   Value:        BLOOD CULTURE RECEIVED NO GROWTH TO DATE CULTURE WILL  BE HELD FOR 5 DAYS BEFORE ISSUING A FINAL NEGATIVE REPORT     Performed at [facility]   Report Status PENDING   Incomplete   Studies/Results: Dg Chest 2 View  01/10/2013   *RADIOLOGY REPORT*  Clinical Data: Chest pain, hypertension  CHEST - 2 VIEW  Comparison: 01/04/2013  Findings: Minimal enlargement of cardiac silhouette. Rotation to the left. Mediastinal contours and pulmonary vascularity normal. Atherosclerotic calcification aorta. New small bilateral pleural effusions. Additionally there is consolidation in the left lower lobe, new. Question minimal infiltrate right mid lung. No pneumothorax. Bones demineralized.  IMPRESSION: Mild enlargement of cardiac silhouette. Bibasilar effusions with left lower lobe infiltrate and question minimal right mid lung infiltrate.   Original Report Authenticated By: Ulyses Southward, M.D.   US Abdomen Complete  01/10/2013   *RADIOLOGY REPORT*  Clinical Data:  Right upper quadrant abdominal pain.  History of colon cancer.  COMPLETE ABDOMINAL ULTRASOUND  Comparison:  No priors.  Findings:  Gallbladder:  No shadowing gallstones or echogenic sludge.  No gallbladder wall thickening or pericholecystic fluid.  Negative sonographic Murphy's sign according to the ultrasound technologist.  Common bile duct:  Normal caliber measuring 4.9 mm in the porta hepatis.  Liver:  No focal mass lesion seen.  Within normal limits in parenchymal echogenicity.  IVC:  Patent throughout its visualized course in the abdomen.  Pancreas:  Although the pancreas is difficult to visualize in its entirety, no focal pancreatic abnormality is identified.  Spleen:  Normal size and echotexture without focal parenchymal abnormality.  6.6 cm in length.  Right Kidney:  No hydronephrosis.  Well-preserved cortex.  Normal size and parenchymal echotexture without focal abnormalities. 10.8 cm in length.  Left Kidney:  No hydronephrosis.  Well-preserved cortex.  Normal size and parenchymal echotexture without  focal abnormalities. 9.7 cm in length.  Abdominal aorta:  Largely obscured by bowel gas distally, but normal caliber proximally measuring up to 2.0 cm in diameter.  Additional findings:  Bilateral pleural effusions incompletely evaluated.  IMPRESSION: 1.  No acute findings to account for the patient's symptoms. Specifically, no evidence of gallstones or findings to suggest acute cholecystitis at this time. 2.  Bilateral pleural effusions incidentally noted.   Original Report Authenticated By: Trudie Reed, M.D.   Ct Abdomen Pelvis W Contrast  01/10/2013   *RADIOLOGY REPORT*  Clinical Data: Weakness.  Nausea, vomiting and abdominal pain.  CT ABDOMEN AND PELVIS WITH CONTRAST  Technique:  Multidetector CT imaging of the abdomen and pelvis was performed following the standard protocol during bolus administration of intravenous contrast.  Contrast: 80mL OMNIPAQUE IOHEXOL 300 MG/ML  SOLN  Comparison: No priors.  Findings:  Lung Bases: Moderate bilateral pleural effusions with associated passive atelectasis in the lower lobes of the lungs bilaterally.  Peripheral ground-glass attenuation and some subpleural reticulation in the visualized lung bases.  A few thick-walled cysts are noted in the right lower lobe.  Small volume of pericardial fluid.  No associated pericardial calcification in the visualized portions of the thorax.  Heart size appears borderline enlarged.  Atherosclerotic calcification and in the right coronary artery.  There is a suggestion of a small filling defects in the left lateral aspect of the left atrium on image 1 of series 2, however, this is the first image of the study and this is incompletely visualized.  Abdomen/Pelvis:  The appearance of the liver, gallbladder, spleen and bilateral adrenal glands is unremarkable.  Pancreatic atrophy. No focal pancreatic lesions are noted.  Multifocal cortical thinning throughout the kidneys bilaterally, compatible with post infectious or inflammatory  scarring.  Multiple subcentimeter low attenuation lesions are noted in the kidneys bilaterally but are too small to definitively characterize.  Extensive atherosclerosis throughout the abdominal and pelvic vasculature, without definite aneurysm or dissection.  Presacral fluid and thickening is of uncertain etiology and significance, but may reflect edema and/or inflammation.  Large amount of well formed stool in the distal rectum.  The more proximal colon is otherwise relatively decompressed, and the overall stone burden appears normal.  Urinary bladder is completely decompressed with Foley balloon catheter in place.  Status post hysterectomy.  Ovaries are atrophic.  No significant volume of ascites.  No pneumoperitoneum. No pathologic distension of small bowel.  No definite pathologic lymphadenopathy identified within the abdomen or pelvis on today's examination.  Musculoskeletal: There are no aggressive appearing lytic or blastic lesions noted in the visualized portions of the skeleton. Multilevel degenerative disc disease, most severe at L3-L4.  IMPRESSION:  1.  Large volume of well formed stool in the distal rectum.  This is nonspecific, but can be seen in the setting of fecal impaction. Given the lack of the more proximal colonic distension and overall normal stool burden, this is favored to be an incidental finding but clinical correlation is recommended, as there is a large amount of presacral fluid and thickening which may indicate some edema and/or inflammation. 2.  No other potential acute findings in the abdomen or pelvis to account for the patient's symptoms. 3.  Extensive atherosclerosis, including right coronary artery disease. 4.  Image #1 demonstrates a potential filling defect in the lateral aspect of the left atrium immediately posterior to the left atrioventricular groove.  This patient with history of atrial fibrillation, clinical correlation is recommended with consideration for further  evaluation with echocardiography to exclude intracardiac thrombus. 5.  Moderate bilateral pleural effusions with associated passive atelectasis throughout the lower lobes of the lungs bilaterally. 6.  Unusual appearance of the lung parenchyma, as above, which could be indicative of an interstitial lung disease.  Follow-up high-resolution chest CT after the patient's acute illness has resolved is suggested to better assess these findings and characterize this potential interstitial lung disease. 7.  Small amount of pericardial fluid, unlikely to be of hemodynamic significance at this time. 8.  Additional incidental findings, as above.   Original Report Authenticated By: Trudie Reed, M.D.   Medications: I have reviewed the patient's current medications. Scheduled Meds: . antiseptic oral rinse  15 mL Mouth Rinse q12n4p  . aztreonam  2 g Intravenous Q8H  . budesonide-formoterol  2 puff Inhalation BID  . chlorhexidine  15 mL Mouth Rinse BID  . donepezil  5 mg Oral QHS  . haloperidol lactate  1 mg Intravenous Once  .  lisinopril  10 mg Oral Daily  . vancomycin  500 mg Intravenous Q12H  . Warfarin - Pharmacist Dosing Inpatient   Does not apply q1800   Continuous Infusions: . 0.9 % NaCl with KCl 40 mEq / L 125 mL/hr at 01/12/13 1146   PRN Meds:.RESOURCE THICKENUP CLEAR Assessment/Plan: Principal Problem:   HCAP (healthcare-associated pneumonia) Active Problems:   Atrial fibrillation   Hypertension   Urinary tract infection  Ms. Sheala Dosh is a 77 year old lady with a PMHx of atrial fibrillation, hypertension, and dementia who presents with generalized weakness and left lower lobe HCAP.   **HCAP  Chest x-ray shows left lower lobe infiltrate. Bilateral pleural effusions noted on abdominal ultrasound and chest CT. Recently hospitalized at [facility] so we will treat for HCAP.  - Aztreonam and vancomycin IV for HCAP (anaphylaxis to penicillin)  - Follow-up blood cultures, sputum  culture and gram stain  - Negative for strep pneumo urine antigen, legionella urine antigen and HIV  - SLP complete: on Dysphagia 1 diet (puree with nectar thick liquid)   **Weakness/Decreased Appetite Likely multifactorial as patient is deconditioned from multiple recent hospital stays, has infection (HCAP) and meets criteria for severe malnutrition. No signs of stroke on CT or focal neurologic deficits.  - NS infusion @125ml /hr  - Treating HCAP as above  - Nutrition and SLP consulted and recommendations appreciated  - Diet - Dysphagia 1 (puree) and nectar thick liquids, with added supplement  - Will continue to monitor electrolytes and replete as necessary   **Altered Mental Status Stable since discharge. She is alert to person only today. She probably has undergone a worsening of her underlying dementia and will not return to her baseline of living independently, and family is aware of this. Subacute stroke is important to consider given history of stepwise decline, especially with the suggestion of left atrial filling defect on CT chest.  - Consider brain MRI  - Resumed donepezil  **Atrial fibrillation Patient is on warfarin. INR 3.16 today. Possible small left atrial filling defect on CT. HR bradycardic to 45 yesterday.  - Continue telemetry  - Warfarin per pharmacy. Consider decreasing dose 10% due to INR between 3.01 and 4.   - Holding metoprolol, digoxin due to recent bradycardia.   **HTN  BP ranging (140-160)/(50-100) despite resuming lisinopril - Consider restarting metoprolol or adding an additional antihypertensive. HR is 72 today.   **DVT PPX  Patient is on warfarin  **Dispo PT recommends eventual patient discharge to SNF    This is a Psychologist, occupational Note.  The care of the patient was discussed with Dr. Andrey Campanile and the assessment and plan formulated with their assistance.  Please see their attached note for official documentation of the daily encounter.   LOS: 2 days     Merrie Roof, MS3 01/12/2013, 1:33 PM

## 2013-01-12 NOTE — Progress Notes (Signed)
ANTICOAGULATION CONSULT NOTE - Follow Up Consult  Pharmacy Consult for Coumadin Indication: atrial fibrillation  Allergies  Allergen Reactions  . Penicillins Anaphylaxis and Swelling  . Ambien (Zolpidem Tartrate)     tachycardia    . Aspirin Nausea And Vomiting  . Codeine Itching  . Tartrazine Nausea Only    Patient Measurements: Height: 5\' 6"  (167.6 cm) Weight: 144 lb 10 oz (65.6 kg) IBW/kg (Calculated) : 59.3  Vital Signs: Temp: 98.1 F (36.7 C) (08/09 0932) Temp src: Oral (08/09 0605) BP: 136/83 mmHg (08/09 0932) Pulse Rate: 44 (08/09 0932)  Labs:  Recent Labs  01/10/13 1524 01/11/13 0520 01/12/13 0420  HGB 13.5 11.6* 11.6*  HCT 39.6 35.1* 33.8*  PLT 228 204 206  APTT 62*  --   --   LABPROT 28.0* 29.2* 31.3*  INR 2.73* 2.89* 3.16*  CREATININE 0.68 0.55 0.51  TROPONINI <0.30  --   --     Estimated Creatinine Clearance: 56.9 ml/min (by C-G formula based on Cr of 0.51).   Medications:  Scheduled:  . antiseptic oral rinse  15 mL Mouth Rinse q12n4p  . aztreonam  2 g Intravenous Q8H  . budesonide-formoterol  2 puff Inhalation BID  . chlorhexidine  15 mL Mouth Rinse BID  . donepezil  5 mg Oral QHS  . haloperidol lactate  1 mg Intravenous Once  . lisinopril  10 mg Oral Daily  . vancomycin  500 mg Intravenous Q12H  . Warfarin - Pharmacist Dosing Inpatient   Does not apply q1800    Assessment: 77 yo F admitted with AMS and UTI.  To continue Coumadin for hx Afib.  INR therapeutic on admission.  Now supratherapeutic likely realted to decreased oral intake x 48 hours.  Goal of Therapy:  INR 2-3 Monitor platelets by anticoagulation protocol: Yes   Plan:  No Coumadin tonight. Continue daily INR.  [employer], Pharm.D., BCPS Clinical Pharmacist Pager [phone] 01/12/2013 11:30 AM

## 2013-01-12 NOTE — Evaluation (Signed)
Physical Therapy Evaluation Patient Details Name: Ashley Rubio MRN: [id number] DOB: 1936/02/20 Today's Date: 01/12/2013 Time: 1191-4782 PT Time Calculation (min): 22 min  PT Assessment / Plan / Recommendation History of Present Illness  77yo F with afbi who previously was independently living on her own until June where she has had significant decline in her health, has had 2 hospitalizations at [facility] one for UTI, she was recently placed on digoxin as well as haldol by SNF when she was discharged to home. Now presents with lethargy, altered mental status, suprapubic pain. AMS likely due to several factors including infection +/- drug side effects from digoxin and haldol  Clinical Impression  Pt easily agitated and needed cues for redirection to task.  Attempted to sit pt EOB however pt unable to maintain no longer than 30 seconds before pt wanting to return to supine.  Pt needed total (A) +2 to perform rolling and pericare.  Pt will benefit from acute PT services to improve overall mobility and prepare for safe d/c to next venue.     PT Assessment  Patient needs continued PT services    Follow Up Recommendations  SNF    Equipment Recommendations  Wheelchair (measurements PT)    Frequency Min 2X/week    Precautions / Restrictions Precautions Precautions: Fall   Pertinent Vitals/Pain Moans with rolling and during pericare but does not rate pain or state location      Mobility  Bed Mobility Bed Mobility: Rolling Right;Rolling Left;Left Sidelying to Sit Rolling Right: 1: +2 Total assist;With rail Rolling Right: Patient Percentage: 0% Rolling Left: 1: +2 Total assist;With rail Rolling Left: Patient Percentage: 0% Left Sidelying to Sit: 1: +2 Total assist;With rails Left Sidelying to Sit: Patient Percentage: 10% Supine to Sit: 1: +2 Total assist Supine to Sit: Patient Percentage: 10% Details for Bed Mobility Assistance: +2 (A) with all mobility resiting movement at times.  Max  cues for proper technique Transfers Transfers: Not assessed Ambulation/Gait Ambulation/Gait Assistance: Not tested (comment)    Exercises     PT Diagnosis: Difficulty walking;Generalized weakness;Altered mental status  PT Problem List: Decreased strength;Decreased activity tolerance;Decreased balance;Decreased mobility;Decreased cognition;Decreased knowledge of use of DME;Decreased safety awareness PT Treatment Interventions: DME instruction;Gait training;Functional mobility training;Therapeutic activities;Therapeutic exercise;Balance training;Patient/family education     PT Goals(Current goals can be found in the care plan section) Acute Rehab PT Goals Patient Stated Goal: Did not set PT Goal Formulation: Patient unable to participate in goal setting Time For Goal Achievement: 01/26/13 Potential to Achieve Goals: Fair  Visit Information  Last PT Received On: 01/12/13 Assistance Needed: +2 History of Present Illness: 77yo F with afbi who previously was independently living on her own until June where she has had significant decline in her health, has had 2 hospitalizations at [facility] one for UTI, she was recently placed on digoxin as well as haldol by SNF when she was discharged to home. Now presents with lethargy, altered mental status, suprapubic pain. AMS likely due to several factors including infection +/- drug side effects from digoxin and haldol       Prior Functioning  Home Living Family/patient expects to be discharged to:: Private residence Living Arrangements: Children Available Help at Discharge: Family Type of Home: House Home Access: Stairs to enter Home Layout: One level Home Equipment: Environmental consultant - 2 wheels;Bedside commode;Shower seat Additional Comments: Information from OT/PT note from last admission. Pt unable to provide information. Prior Function Level of Independence: Needs assistance Gait / Transfers Assistance Needed: Was amb for 1-2  days at son's home before  declining again and son was having to carry her. ADL's / Homemaking Assistance Needed: Son assisting with most adls. Communication Communication: No difficulties Dominant Hand: Right    Cognition  Cognition Arousal/Alertness: Lethargic Behavior During Therapy: Agitated Overall Cognitive Status: No family/caregiver present to determine baseline cognitive functioning Area of Impairment: Orientation;Attention;Following commands;Problem solving Orientation Level: Disoriented to;Place;Time;Situation Current Attention Level: Focused Memory: Decreased short-term memory Following Commands: Follows one step commands inconsistently;Follows one step commands with increased time Problem Solving: Slow processing;Decreased initiation;Difficulty sequencing;Requires verbal cues;Requires tactile cues    Extremity/Trunk Assessment Upper Extremity Assessment Upper Extremity Assessment: Generalized weakness;Difficult to assess due to impaired cognition Lower Extremity Assessment Lower Extremity Assessment: Generalized weakness   Balance Balance Balance Assessed: Yes Static Sitting Balance Static Sitting - Balance Support: Bilateral upper extremity supported;Feet supported Static Sitting - Level of Assistance: 1: +2 Total assist Static Sitting - Comment/# of Minutes: +2 (A) to maintain balance and promote midline due to severe left sided lean with occasional attempts to return to supine.  End of Session PT - End of Session Activity Tolerance: Patient limited by fatigue Patient left: in bed;with call bell/phone within reach;with bed alarm set Nurse Communication: Mobility status  GP     Rosemary Pentecost 01/12/2013, 10:50 AM  Jake Shark, PT DPT [phone]

## 2013-01-12 NOTE — Progress Notes (Signed)
Subjective: Overnight Ashley Rubio had periods of agitation and kept trying to get out of bed.  She was seen and examined by me today.  She was cooperative with exam and without complaint.  She has uneaten food on bedside tray.  Objective: Vital signs in last 24 hours: Filed Vitals:   01/12/13 0607 01/12/13 0857 01/12/13 0932 01/12/13 1309  BP:   136/83 149/86  Pulse: 95  44 72  Temp:   98.1 F (36.7 C) 98.6 F (37 C)  TempSrc:      Resp:   18 18  Height:      Weight:      SpO2:  98% 96% 98%   Weight change: 5 kg (11 lb 0.4 oz)  Intake/Output Summary (Last 24 hours) at 01/12/13 1605 Last data filed at 01/12/13 1310  Gross per 24 hour  Intake 1156.25 ml  Output   1151 ml  Net   5.25 ml   General: resting in bed in NAD HEENT: PERRL, dry mucous membranes Cardiac: irregularly irregular rhythm Pulm: clear to auscultation bilaterally, moving normal volumes of air Abd: soft, nontender, nondistended, BS present Ext: warm and well perfused, no pedal edema, RUE decreasing edema Neuro: alert, oriented to self only  Lab Results: Basic Metabolic Panel:  Recent Labs Lab 01/10/13 2307 01/11/13 0520 01/12/13 0420  NA  --  137 136  K  --  3.7 4.2  CL  --  104 105  CO2  --  22 21  GLUCOSE  --  77 81  BUN  --  10 9  CREATININE  --  0.55 0.51  CALCIUM  --  8.0* 8.1*  MG 1.2*  --  1.9  PHOS  --   --  2.4   Liver Function Tests:  Recent Labs Lab 01/10/13 1524  AST 18  ALT 11  ALKPHOS 96  BILITOT 0.9  PROT 5.9*  ALBUMIN 2.3*   CBC:  Recent Labs Lab 01/10/13 1524 01/11/13 0520 01/12/13 0420  WBC 10.3 7.6 7.3  NEUTROABS 8.6*  --  5.7  HGB 13.5 11.6* 11.6*  HCT 39.6 35.1* 33.8*  MCV 90.4 90.2 89.7  PLT 228 204 206   Cardiac Enzymes:  Recent Labs Lab 01/10/13 1524  TROPONINI <0.30   Coagulation:  Recent Labs Lab 01/08/13 0500 01/10/13 1524 01/11/13 0520 01/12/13 0420  LABPROT 22.5* 28.0* 29.2* 31.3*  INR 2.05* 2.73* 2.89* 3.16*    Urinalysis:  Recent Labs Lab 01/10/13 1720  COLORURINE AMBER*  LABSPEC 1.017  PHURINE 5.5  GLUCOSEU NEGATIVE  HGBUR SMALL*  BILIRUBINUR LARGE*  KETONESUR 40*  PROTEINUR 30*  UROBILINOGEN 1.0  NITRITE NEGATIVE  LEUKOCYTESUR MODERATE*    Micro Results: Recent Results (from the past 240 hour(s))  URINE CULTURE     Status: None   Collection Time    01/04/13  5:51 PM      Result Value Range Status   Specimen Description URINE, CATHETERIZED   Final   Special Requests NONE   Final   Culture  Setup Time 01/04/2013 22:48   Final   Colony Count >=100,000 COLONIES/ML   Final   Culture     Final   Value: Multiple bacterial morphotypes present, none predominant. Suggest appropriate recollection if clinically indicated.   Report Status 01/05/2013 FINAL   Final  CULTURE, BLOOD (ROUTINE X 2)     Status: None   Collection Time    01/04/13  6:20 PM      Result Value Range  Status   Specimen Description BLOOD ARM LEFT   Final   Special Requests BOTTLES DRAWN AEROBIC ONLY 5CC   Final   Culture  Setup Time     Final   Value: 01/04/2013 22:23     Performed at [facility]   Culture     Final   Value: NO GROWTH 5 DAYS     Performed at [facility]   Report Status 01/10/2013 FINAL   Final  CULTURE, BLOOD (ROUTINE X 2)     Status: None   Collection Time    01/04/13  6:28 PM      Result Value Range Status   Specimen Description BLOOD HAND LEFT   Final   Special Requests BOTTLES DRAWN AEROBIC ONLY 1CC   Final   Culture  Setup Time     Final   Value: 01/05/2013 01:32     Performed at [facility]   Culture     Final   Value: NO GROWTH 5 DAYS     Performed at [facility]   Report Status 01/11/2013 FINAL   Final  URINE CULTURE     Status: None   Collection Time    01/06/13  2:13 PM      Result Value Range Status   Specimen Description URINE, CATHETERIZED   Final   Special Requests NONE   Final   Culture  Setup Time     Final   Value: 01/06/2013  21:58     Performed at [facility] Count     Final   Value: >=100,000 COLONIES/ML     Performed at [facility]   Culture     Final   Value: YEAST     Performed at [facility]   Report Status 01/07/2013 FINAL   Final  URINE CULTURE     Status: None   Collection Time    01/10/13  5:20 PM      Result Value Range Status   Specimen Description URINE, CATHETERIZED   Final   Special Requests NONE   Final   Culture  Setup Time     Final   Value: 01/10/2013 19:10     Performed at [facility] Count     Final   Value: 75,000 COLONIES/ML     Performed at [facility]   Culture     Final   Value: ENTEROCOCCUS SPECIES     Performed at [facility]   Report Status 01/12/2013 FINAL   Final   Organism ID, Bacteria ENTEROCOCCUS SPECIES   Final  CULTURE, BLOOD (ROUTINE X 2)     Status: None   Collection Time    01/10/13  5:30 PM      Result Value Range Status   Specimen Description BLOOD ARM LEFT   Final   Special Requests BOTTLES DRAWN AEROBIC AND ANAEROBIC 10CC   Final   Culture  Setup Time     Final   Value: 01/10/2013 23:26     Performed at [facility]   Culture     Final   Value:        BLOOD CULTURE RECEIVED NO GROWTH TO DATE CULTURE WILL BE HELD FOR 5 DAYS BEFORE ISSUING A FINAL NEGATIVE REPORT     Performed at [facility]   Report Status PENDING   Incomplete  CULTURE, BLOOD (ROUTINE X 2)     Status: None  Collection Time    01/10/13  5:39 PM      Result Value Range Status   Specimen Description BLOOD HAND LEFT   Final   Special Requests BOTTLES DRAWN AEROBIC ONLY 8CC   Final   Culture  Setup Time     Final   Value: 01/10/2013 23:26     Performed at [facility]   Culture     Final   Value:        BLOOD CULTURE RECEIVED NO GROWTH TO DATE CULTURE WILL BE HELD FOR 5 DAYS BEFORE ISSUING A FINAL NEGATIVE REPORT     Performed at [facility]   Report Status PENDING   Incomplete    Studies/Results: US Abdomen Complete  01/10/2013   *RADIOLOGY REPORT*  Clinical Data:  Right upper quadrant abdominal pain.  History of colon cancer.  COMPLETE ABDOMINAL ULTRASOUND  Comparison:  No priors.  Findings:  Gallbladder:  No shadowing gallstones or echogenic sludge.  No gallbladder wall thickening or pericholecystic fluid.  Negative sonographic Murphy's sign according to the ultrasound technologist.  Common bile duct:  Normal caliber measuring 4.9 mm in the porta hepatis.  Liver:  No focal mass lesion seen.  Within normal limits in parenchymal echogenicity.  IVC:  Patent throughout its visualized course in the abdomen.  Pancreas:  Although the pancreas is difficult to visualize in its entirety, no focal pancreatic abnormality is identified.  Spleen:  Normal size and echotexture without focal parenchymal abnormality.  6.6 cm in length.  Right Kidney:  No hydronephrosis.  Well-preserved cortex.  Normal size and parenchymal echotexture without focal abnormalities. 10.8 cm in length.  Left Kidney:  No hydronephrosis.  Well-preserved cortex.  Normal size and parenchymal echotexture without focal abnormalities. 9.7 cm in length.  Abdominal aorta:  Largely obscured by bowel gas distally, but normal caliber proximally measuring up to 2.0 cm in diameter.  Additional findings:  Bilateral pleural effusions incompletely evaluated.  IMPRESSION: 1.  No acute findings to account for the patient's symptoms. Specifically, no evidence of gallstones or findings to suggest acute cholecystitis at this time. 2.  Bilateral pleural effusions incidentally noted.   Original Report Authenticated By: Trudie Reed, M.D.   Ct Abdomen Pelvis W Contrast  01/10/2013   *RADIOLOGY REPORT*  Clinical Data: Weakness.  Nausea, vomiting and abdominal pain.  CT ABDOMEN AND PELVIS WITH CONTRAST  Technique:  Multidetector CT imaging of the abdomen and pelvis was performed following the standard protocol during bolus administration of  intravenous contrast.  Contrast: 80mL OMNIPAQUE IOHEXOL 300 MG/ML  SOLN  Comparison: No priors.  Findings:  Lung Bases: Moderate bilateral pleural effusions with associated passive atelectasis in the lower lobes of the lungs bilaterally. Peripheral ground-glass attenuation and some subpleural reticulation in the visualized lung bases.  A few thick-walled cysts are noted in the right lower lobe.  Small volume of pericardial fluid.  No associated pericardial calcification in the visualized portions of the thorax.  Heart size appears borderline enlarged.  Atherosclerotic calcification and in the right coronary artery.  There is a suggestion of a small filling defects in the left lateral aspect of the left atrium on image 1 of series 2, however, this is the first image of the study and this is incompletely visualized.  Abdomen/Pelvis:  The appearance of the liver, gallbladder, spleen and bilateral adrenal glands is unremarkable.  Pancreatic atrophy. No focal pancreatic lesions are noted.  Multifocal cortical thinning throughout the kidneys bilaterally, compatible with post infectious or inflammatory  scarring.  Multiple subcentimeter low attenuation lesions are noted in the kidneys bilaterally but are too small to definitively characterize.  Extensive atherosclerosis throughout the abdominal and pelvic vasculature, without definite aneurysm or dissection.  Presacral fluid and thickening is of uncertain etiology and significance, but may reflect edema and/or inflammation.  Large amount of well formed stool in the distal rectum.  The more proximal colon is otherwise relatively decompressed, and the overall stone burden appears normal.  Urinary bladder is completely decompressed with Foley balloon catheter in place.  Status post hysterectomy.  Ovaries are atrophic.  No significant volume of ascites.  No pneumoperitoneum. No pathologic distension of small bowel.  No definite pathologic lymphadenopathy identified within the  abdomen or pelvis on today's examination.  Musculoskeletal: There are no aggressive appearing lytic or blastic lesions noted in the visualized portions of the skeleton. Multilevel degenerative disc disease, most severe at L3-L4.  IMPRESSION:  1.  Large volume of well formed stool in the distal rectum.  This is nonspecific, but can be seen in the setting of fecal impaction. Given the lack of the more proximal colonic distension and overall normal stool burden, this is favored to be an incidental finding but clinical correlation is recommended, as there is a large amount of presacral fluid and thickening which may indicate some edema and/or inflammation. 2.  No other potential acute findings in the abdomen or pelvis to account for the patient's symptoms. 3.  Extensive atherosclerosis, including right coronary artery disease. 4.  Image #1 demonstrates a potential filling defect in the lateral aspect of the left atrium immediately posterior to the left atrioventricular groove.  This patient with history of atrial fibrillation, clinical correlation is recommended with consideration for further evaluation with echocardiography to exclude intracardiac thrombus. 5.  Moderate bilateral pleural effusions with associated passive atelectasis throughout the lower lobes of the lungs bilaterally. 6.  Unusual appearance of the lung parenchyma, as above, which could be indicative of an interstitial lung disease.  Follow-up high-resolution chest CT after the patient's acute illness has resolved is suggested to better assess these findings and characterize this potential interstitial lung disease. 7.  Small amount of pericardial fluid, unlikely to be of hemodynamic significance at this time. 8.  Additional incidental findings, as above.   Original Report Authenticated By: Trudie Reed, M.D.   Medications: I have reviewed the patient's current medications. Scheduled Meds: . antiseptic oral rinse  15 mL Mouth Rinse q12n4p  .  aztreonam  2 g Intravenous Q8H  . budesonide-formoterol  2 puff Inhalation BID  . chlorhexidine  15 mL Mouth Rinse BID  . donepezil  5 mg Oral QHS  . haloperidol lactate  1 mg Intravenous Once  . lisinopril  10 mg Oral Daily  . vancomycin  500 mg Intravenous Q12H  . Warfarin - Pharmacist Dosing Inpatient   Does not apply q1800   Continuous Infusions: . 0.9 % NaCl with KCl 40 mEq / L 125 mL/hr at 01/12/13 1146   PRN Meds:.RESOURCE THICKENUP CLEAR  Assessment/Plan: [name] is a 77 year old female with past medical history of atrial fibrillation on Coumadin, digoxin and metoprolol; hypertension; and a recent hospitalization here at [facility] for urinary tract infection with altered mental status, who presents today with a chief complaint of diffuse weakness and productive cough, found to have a left lower lobe infiltrate on chest x-ray.   #Health care-acquired pneumonia - Chest x-ray shows left lower lobe infiltrate. Bilateral pleural effusions noted on abdominal ultrasound and chest  CT. This clinical picture most likely represents a health care-acquired pneumonia given her many recent hospitalizations. Symptoms have resolved. - continue Aztreonam and vancomycin IV for HCAP (anaphylaxis to penicillin)  - Follow-up blood cultures, sputum culture and gram stain  - Negative for strep pneumo urine antigen, legionella urine antigen and HIV  - SLP complete today and recommend Dysphagia 1 diet (puree with nectar thick liquid)   #UTI - repeat urine culture positive for enterococcus - patient already on vancomycin  #Decreased appetite - Abdominal exam unremarkable and pain not reproducable.  CT abdomen this evening showed a large volume of well formed stool in the distal rectum, incidental vs. fecal impaction.   - Consider disimpaction if she develops new abdominal pain   #Diffuse weakness - Likely multifactorial as patient is deconditioned from multiple recent hospital stays, has infection  (HCAP) and meets criteria for severe malnutrition. No signs of stroke on CT or focal neurologic deficits.  - NS infusion @125ml /her  - Treating HCAP as above  - Nutrition and SLP consulted and recommendations appreciated  - Diet - Dysphagia 1 (puree) and nectar thick liquids, with added supplement  - Will continue to monitor electrolytes and replete as necessary   #Delirium/encephalopathy - Stable since discharge. She remains confused and disoriented, but alert and non-agitated. Likely multifactorial etiology for same reasons as above. She probably has undergone a worsening of her underlying dementia and will not return to her baseline of living independently, and family is aware of this. Subacute stroke is important to consider given history of stepwise decline, especially with the suggestion of left atrial filling defect on CT chest. Not currently agitated or delirious.  - Consider brain MRI  - continue Aricept   #Atrial fibrillation - Patient is therapeutic on coumadin (INR = 2.89). Possible small left atrial filling defect on CT. HR bradycardic to 45 yesterday.  - Continue telemetry  - Coumadin per pharmacy  - Holding metoprolol, digoxin as pt was bradycardic yesterday. PR currently 70s. If develops tachycardia will add beta-blocker and titrate to goal.   #HTN - BP up to 152/76 today.  - continue lisinopril   #COPD - Stable  - continue Symbicort inhaler   #Hypokalemia - Resolved, 4.2. Likely secondary to decreased po intake.  - Mg 1.9 today - AM BMP   #Heart murmer - Soft holosystolic murmur best heard at the apex. History of mitral regurgitation. Afebrile. No stigmata of endocarditis on exam.  - Follow-up blood cultures - no vegetations on echo  #DVT PPX - coumadin  Dispo: Disposition is deferred at this time, awaiting improvement of current medical problems.  Anticipated discharge in approximately 1-2 day(s).   The patient does have a current PCP (S Gillermo Murdoch, MD) and  does need an [facility] hospital follow-up appointment after discharge.  The patient does not have transportation limitations that hinder transportation to clinic appointments.  .Services Needed at time of discharge: Y = Yes, Blank = No PT:   OT:   RN:   Equipment:   Other:     LOS: 2 days   Evelena Peat, DO 01/12/2013, 4:05 PM

## 2013-01-12 NOTE — Progress Notes (Signed)
Patient still confused and agitated.  IV team trying to restart IV.  Patient stating she is going home, that her mother is worried about her and the police are probably at her house. MD notified, will give 1 mg Haldol per MD order.

## 2013-01-13 LAB — CBC WITH DIFFERENTIAL/PLATELET
Eosinophils Relative: 2 % (ref 0–5)
HCT: 36.7 % (ref 36.0–46.0)
Lymphocytes Relative: 15 % (ref 12–46)
Lymphs Abs: 1 10*3/uL (ref 0.7–4.0)
MCV: 90 fL (ref 78.0–100.0)
Monocytes Absolute: 0.6 10*3/uL (ref 0.1–1.0)
RBC: 4.08 MIL/uL (ref 3.87–5.11)
WBC: 6.8 10*3/uL (ref 4.0–10.5)

## 2013-01-13 LAB — BASIC METABOLIC PANEL
BUN: 8 mg/dL (ref 6–23)
BUN: 8 mg/dL (ref 6–23)
BUN: 7 mg/dL (ref 6–23)
CO2: 21 mEq/L (ref 19–32)
Calcium: 8.1 mg/dL — ABNORMAL LOW (ref 8.4–10.5)
Calcium: 8.3 mg/dL — ABNORMAL LOW (ref 8.4–10.5)
Chloride: 111 mEq/L (ref 96–112)
Creatinine, Ser: 0.52 mg/dL (ref 0.50–1.10)
Creatinine, Ser: 0.47 mg/dL — ABNORMAL LOW (ref 0.50–1.10)
GFR calc Af Amer: >90 mL/min (ref >90)
GFR calc non Af Amer: >90 mL/min (ref >90)
Glucose, Bld: 76 mg/dL (ref 70–99)
Glucose, Bld: 87 mg/dL (ref 70–99)

## 2013-01-13 MED ORDER — INSULIN ASPART 100 UNIT/ML IV SOLN
10.0000 [IU] | Freq: Once | INTRAVENOUS | Status: AC
  Administered 2013-01-13: 10 [IU] via INTRAVENOUS

## 2013-01-13 MED ORDER — SODIUM CHLORIDE 0.9 % IV BOLUS (SEPSIS)
500.0000 mL | Freq: Once | INTRAVENOUS | Status: AC
  Administered 2013-01-13: 500 mL via INTRAVENOUS

## 2013-01-13 MED ORDER — SODIUM BICARBONATE 8.4 % IV SOLN
50.0000 meq | Freq: Once | INTRAVENOUS | Status: AC
  Administered 2013-01-13: 50 meq via INTRAVENOUS
  Filled 2013-01-13: qty 50

## 2013-01-13 MED ORDER — WARFARIN SODIUM 2 MG PO TABS
2.0000 mg | ORAL_TABLET | Freq: Once | ORAL | Status: AC
  Administered 2013-01-13: 2 mg via ORAL
  Filled 2013-01-13: qty 1

## 2013-01-13 MED ORDER — DEXTROSE 50 % IV SOLN
1.0000 | Freq: Once | INTRAVENOUS | Status: AC
  Administered 2013-01-13: 50 mL via INTRAVENOUS
  Filled 2013-01-13: qty 50

## 2013-01-13 MED ORDER — ALBUTEROL SULFATE (5 MG/ML) 0.5% IN NEBU
10.0000 mg | INHALATION_SOLUTION | Freq: Once | RESPIRATORY_TRACT | Status: AC
  Administered 2013-01-14: 10 mg via RESPIRATORY_TRACT
  Filled 2013-01-13: qty 2

## 2013-01-13 MED ORDER — CALCIUM GLUCONATE 10 % IV SOLN
2.0000 g | Freq: Once | INTRAVENOUS | Status: AC
  Administered 2013-01-13: 2 g via INTRAVENOUS
  Filled 2013-01-13: qty 20
  Filled 2013-01-13 (×2): qty 10

## 2013-01-13 MED ORDER — FUROSEMIDE 10 MG/ML IJ SOLN
20.0000 mg | Freq: Once | INTRAMUSCULAR | Status: DC
  Filled 2013-01-13: qty 2

## 2013-01-13 MED ORDER — SODIUM CHLORIDE 0.9 % IV SOLN
INTRAVENOUS | Status: DC
  Administered 2013-01-14 – 2013-01-17 (×2): via INTRAVENOUS

## 2013-01-13 MED ORDER — SODIUM POLYSTYRENE SULFONATE 15 GM/60ML PO SUSP
15.0000 g | Freq: Once | ORAL | Status: AC
  Administered 2013-01-13: 15 g via ORAL
  Filled 2013-01-13: qty 60

## 2013-01-13 NOTE — Progress Notes (Signed)
ANTICOAGULATION CONSULT NOTE - Follow Up Consult  Pharmacy Consult for Coumadin Indication: atrial fibrillation  Allergies  Allergen Reactions  . Penicillins Anaphylaxis and Swelling  . Ambien (Zolpidem Tartrate)     tachycardia    . Aspirin Nausea And Vomiting  . Codeine Itching  . Tartrazine Nausea Only    Patient Measurements: Height: 5\' 6"  (167.6 cm) Weight: 144 lb 6.4 oz (65.5 kg) IBW/kg (Calculated) : 59.3  Vital Signs: Temp: 98.5 F (36.9 C) (08/10 0841) Temp src: Oral (08/10 0514) BP: 143/80 mmHg (08/10 0841) Pulse Rate: 70 (08/10 0841)  Labs:  Recent Labs  01/10/13 1524 01/11/13 0520 01/12/13 0420 01/13/13 0400  HGB 13.5 11.6* 11.6* 12.4  HCT 39.6 35.1* 33.8* 36.7  PLT 228 204 206 223  APTT 62*  --   --   --   LABPROT 28.0* 29.2* 31.3* 28.3*  INR 2.73* 2.89* 3.16* 2.77*  CREATININE 0.68 0.55 0.51 0.52  TROPONINI <0.30  --   --   --     Estimated Creatinine Clearance: 56.9 ml/min (by C-G formula based on Cr of 0.52).   Medications:  Scheduled:  . antiseptic oral rinse  15 mL Mouth Rinse q12n4p  . aztreonam  2 g Intravenous Q8H  . budesonide-formoterol  2 puff Inhalation BID  . chlorhexidine  15 mL Mouth Rinse BID  . donepezil  5 mg Oral QHS  . haloperidol lactate  1 mg Intravenous Once  . lisinopril  10 mg Oral Daily  . vancomycin  500 mg Intravenous Q12H  . Warfarin - Pharmacist Dosing Inpatient   Does not apply q1800    Assessment: 77 yo F admitted with AMS and UTI.  To continue Coumadin for hx Afib.  INR trending down into therapeutic range.  Noted pt still has decreased appetite and oral intake.  Goal of Therapy:  INR 2-3 Monitor platelets by anticoagulation protocol: Yes   Plan:  Coumadin 2mg  PO x 1 tonight. Continue daily INR.  [employer], Pharm.D., BCPS Clinical Pharmacist Pager [phone] 01/13/2013 11:44 AM

## 2013-01-13 NOTE — Progress Notes (Signed)
Subjective: Ashley Rubio was seen and examine by me this AM with family at bedside.  She is feeling better and ate some of her breakfast this AM.  Family is at bedside.  Objective: Vital signs in last 24 hours: Filed Vitals:   01/13/13 0808 01/13/13 0841 01/13/13 1328 01/13/13 1548  BP:  143/80 151/80 146/83  Pulse:  70 69 100  Temp:  98.5 F (36.9 C) 98 F (36.7 C) 98.3 F (36.8 C)  TempSrc:      Resp:  18 18 16   Height:      Weight:      SpO2: 96% 98% 99% 98%   Weight change: -0.1 kg (-3.5 oz)  Intake/Output Summary (Last 24 hours) at 01/13/13 1754 Last data filed at 01/13/13 1600  Gross per 24 hour  Intake 2817.5 ml  Output   2200 ml  Net  617.5 ml   General: resting in bed in NAD  Cardiac: irregular rhythm Pulm: clear to auscultation bilaterally, moving normal volumes of air Abd: soft, nontender, nondistended, BS present Ext: warm and well perfused, no pedal edema Neuro: alert and oriented X3  Lab Results: Basic Metabolic Panel:  Recent Labs Lab 01/10/13 2307 01/11/13 0520 01/12/13 0420 01/13/13 0400 01/13/13 1135  NA  --  137 136 135 134*  K  --  3.7 4.2 5.6* 5.5*  CL  --  104 105 106 105  CO2  --  22 21 21 19   GLUCOSE  --  77 81 76 87  BUN  --  10 9 8 8   CREATININE  --  0.55 0.51 0.52 0.49*  CALCIUM  --  8.0* 8.1* 8.1* 8.3*  MG 1.2*  --  1.9  --   --   PHOS  --   --  2.4  --   --    Liver Function Tests:  Recent Labs Lab 01/10/13 1524  AST 18  ALT 11  ALKPHOS 96  BILITOT 0.9  PROT 5.9*  ALBUMIN 2.3*   CBC:  Recent Labs Lab 01/12/13 0420 01/13/13 0400  WBC 7.3 6.8  NEUTROABS 5.7 5.0  HGB 11.6* 12.4  HCT 33.8* 36.7  MCV 89.7 90.0  PLT 206 223   Cardiac Enzymes:  Recent Labs Lab 01/10/13 1524  TROPONINI <0.30   Coagulation:  Recent Labs Lab 01/10/13 1524 01/11/13 0520 01/12/13 0420 01/13/13 0400  LABPROT 28.0* 29.2* 31.3* 28.3*  INR 2.73* 2.89* 3.16* 2.77*   Urinalysis:  Recent Labs Lab 01/10/13 1720    COLORURINE AMBER*  LABSPEC 1.017  PHURINE 5.5  GLUCOSEU NEGATIVE  HGBUR SMALL*  BILIRUBINUR LARGE*  KETONESUR 40*  PROTEINUR 30*  UROBILINOGEN 1.0  NITRITE NEGATIVE  LEUKOCYTESUR MODERATE*    Micro Results: Recent Results (from the past 240 hour(s))  URINE CULTURE     Status: None   Collection Time    01/04/13  5:51 PM      Result Value Range Status   Specimen Description URINE, CATHETERIZED   Final   Special Requests NONE   Final   Culture  Setup Time 01/04/2013 22:48   Final   Colony Count >=100,000 COLONIES/ML   Final   Culture     Final   Value: Multiple bacterial morphotypes present, none predominant. Suggest appropriate recollection if clinically indicated.   Report Status 01/05/2013 FINAL   Final  CULTURE, BLOOD (ROUTINE X 2)     Status: None   Collection Time    01/04/13  6:20 PM  Result Value Range Status   Specimen Description BLOOD ARM LEFT   Final   Special Requests BOTTLES DRAWN AEROBIC ONLY 5CC   Final   Culture  Setup Time     Final   Value: 01/04/2013 22:23     Performed at [facility]   Culture     Final   Value: NO GROWTH 5 DAYS     Performed at [facility]   Report Status 01/10/2013 FINAL   Final  CULTURE, BLOOD (ROUTINE X 2)     Status: None   Collection Time    01/04/13  6:28 PM      Result Value Range Status   Specimen Description BLOOD HAND LEFT   Final   Special Requests BOTTLES DRAWN AEROBIC ONLY 1CC   Final   Culture  Setup Time     Final   Value: 01/05/2013 01:32     Performed at [facility]   Culture     Final   Value: NO GROWTH 5 DAYS     Performed at [facility]   Report Status 01/11/2013 FINAL   Final  URINE CULTURE     Status: None   Collection Time    01/06/13  2:13 PM      Result Value Range Status   Specimen Description URINE, CATHETERIZED   Final   Special Requests NONE   Final   Culture  Setup Time     Final   Value: 01/06/2013 21:58     Performed at [facility] Count     Final   Value: >=100,000 COLONIES/ML     Performed at [facility]   Culture     Final   Value: YEAST     Performed at [facility]   Report Status 01/07/2013 FINAL   Final  URINE CULTURE     Status: None   Collection Time    01/10/13  5:20 PM      Result Value Range Status   Specimen Description URINE, CATHETERIZED   Final   Special Requests NONE   Final   Culture  Setup Time     Final   Value: 01/10/2013 19:10     Performed at [facility] Count     Final   Value: 75,000 COLONIES/ML     Performed at [facility]   Culture     Final   Value: ENTEROCOCCUS SPECIES     Performed at [facility]   Report Status 01/12/2013 FINAL   Final   Organism ID, Bacteria ENTEROCOCCUS SPECIES   Final  CULTURE, BLOOD (ROUTINE X 2)     Status: None   Collection Time    01/10/13  5:30 PM      Result Value Range Status   Specimen Description BLOOD ARM LEFT   Final   Special Requests BOTTLES DRAWN AEROBIC AND ANAEROBIC 10CC   Final   Culture  Setup Time     Final   Value: 01/10/2013 23:26     Performed at [facility]   Culture     Final   Value:        BLOOD CULTURE RECEIVED NO GROWTH TO DATE CULTURE WILL BE HELD FOR 5 DAYS BEFORE ISSUING A FINAL NEGATIVE REPORT     Performed at [facility]   Report Status PENDING   Incomplete  CULTURE, BLOOD (ROUTINE X 2)  Status: None   Collection Time    01/10/13  5:39 PM      Result Value Range Status   Specimen Description BLOOD HAND LEFT   Final   Special Requests BOTTLES DRAWN AEROBIC ONLY 8CC   Final   Culture  Setup Time     Final   Value: 01/10/2013 23:26     Performed at [facility]   Culture     Final   Value:        BLOOD CULTURE RECEIVED NO GROWTH TO DATE CULTURE WILL BE HELD FOR 5 DAYS BEFORE ISSUING A FINAL NEGATIVE REPORT     Performed at [facility]   Report Status PENDING   Incomplete   Studies/Results: No results  found. Medications: I have reviewed the patient's current medications. Scheduled Meds: . antiseptic oral rinse  15 mL Mouth Rinse q12n4p  . aztreonam  2 g Intravenous Q8H  . budesonide-formoterol  2 puff Inhalation BID  . chlorhexidine  15 mL Mouth Rinse BID  . donepezil  5 mg Oral QHS  . haloperidol lactate  1 mg Intravenous Once  . lisinopril  10 mg Oral Daily  . sodium polystyrene  15 g Oral Once  . vancomycin  500 mg Intravenous Q12H  . Warfarin - Pharmacist Dosing Inpatient   Does not apply q1800   Continuous Infusions: . 0.9 % NaCl with KCl 40 mEq / L 125 mL/hr at 01/13/13 1501   PRN Meds:.RESOURCE THICKENUP CLEAR  Assessment/Plan: [name] is a 77 year old female with past medical history of atrial fibrillation on Coumadin, digoxin and metoprolol; hypertension; and a recent hospitalization here at [facility] for urinary tract infection with altered mental status, who presents today with a chief complaint of diffuse weakness and productive cough, found to have a left lower lobe infiltrate on chest x-ray.   #Health care-acquired pneumonia - Chest x-ray shows left lower lobe infiltrate. Bilateral pleural effusions noted on abdominal ultrasound and chest CT. This clinical picture most likely represents a health care-acquired pneumonia given her many recent hospitalizations. Symptoms have resolved. - continue Aztreonam and vancomycin IV for HCAP (anaphylaxis to penicillin)  - Follow-up blood cultures, sputum culture and gram stain  - Negative for strep pneumo urine antigen, legionella urine antigen and HIV   #UTI - repeat urine culture positive for enterococcus - patient already on vancomycin  #Decreased appetite - Abdominal exam unremarkable and pain not reproducable.  CT abdomen this evening showed a large volume of well formed stool in the distal rectum, incidental vs. fecal impaction.   - Consider disimpaction if she develops new abdominal pain   #Diffuse weakness - Likely  multifactorial as patient is deconditioned from multiple recent hospital stays, has infection (HCAP) and meets criteria for severe malnutrition. No signs of stroke on CT or focal neurologic deficits.  - NS infusion @125ml /her  - Treating HCAP as above  - Nutrition and SLP consulted and recommendations appreciated  - Diet - Dysphagia 1 (puree) and nectar thick liquids, with added supplement  - Will continue to monitor electrolytes and replete as necessary   #Delirium/encephalopathy - Stable since discharge. She remains confused and disoriented, but alert and non-agitated. Likely multifactorial etiology for same reasons as above. She probably has undergone a worsening of her underlying dementia and will not return to her baseline of living independently, and family is aware of this. Subacute stroke is important to consider given history of stepwise decline, especially with the suggestion of left atrial filling  defect on CT chest. Not currently agitated or delirious.  - Consider brain MRI  - continue Aricept   #Atrial fibrillation - Patient is therapeutic on coumadin (INR = 2.77). Possible small left atrial filling defect on CT.  - Coumadin per pharmacy  - Holding metoprolol, digoxin as pt was bradycardic on admission.  If develops tachycardia will add beta-blocker and titrate to goal.   #HTN - BP up to 152/76 today.  - continue lisinopril   #COPD - Stable  - continue Symbicort inhaler   #Hyperkalemia - 5.6 this AM.  No EKG changes.  Repeat BMP 5.5 --> ordered 15g Kayexalate - check BMP this evening - additional Kayexalate if K > 5.1  #Heart murmer - Soft holosystolic murmur best heard at the apex. History of mitral regurgitation. Afebrile. No stigmata of endocarditis on exam.  - Follow-up blood cultures - no vegetations on echo  #DVT PPX - coumadin   Dispo: Disposition is deferred at this time, awaiting improvement of current medical problems.  Anticipated discharge in approximately 1-2  day(s).   The patient does have a current PCP (S Gillermo Murdoch, MD) and does need an [facility] hospital follow-up appointment after discharge.  The patient does not have transportation limitations that hinder transportation to clinic appointments.  .Services Needed at time of discharge: Y = Yes, Blank = No PT:   OT:   RN:   Equipment:   Other:     LOS: 3 days   Evelena Peat, DO 01/13/2013, 5:54 PM

## 2013-01-13 NOTE — Progress Notes (Signed)
Patient ID: Ashley Rubio, female   DOB: 11/13/1935, 77 y.o.   MRN: [id number]   Subjective: No acute events overnight. She tells [place] that she ate almost all of her lunch although the majority of her food remains in the meal container. She denies shortness of breath, chest pain, abdominal pain, and dysuria. Her family was at bedside earlier in the day although they were not present for the exam.  Objective: Vital signs in last 24 hours: Filed Vitals:   01/12/13 2009 01/13/13 0514 01/13/13 0808 01/13/13 0841  BP: 153/99 150/97  143/80  Pulse: 68 65  70  Temp: 98.4 F (36.9 C) 98.5 F (36.9 C)  98.5 F (36.9 C)  TempSrc: Oral Oral    Resp: 18   18  Height:      Weight: 65.5 kg (144 lb 6.4 oz)     SpO2: 96% 98% 96% 98%   Weight change: -0.1 kg (-3.5 oz)  Intake/Output Summary (Last 24 hours) at 01/13/13 1324 Last data filed at 01/13/13 0842  Gross per 24 hour  Intake 1342.5 ml  Output   1975 ml  Net -632.5 ml   Physical Exam  Constitutional: No distress.  HENT:  Head: Normocephalic and atraumatic.  Cardiovascular: An irregularly irregular rhythm present. Exam reveals no gallop and no friction rub.   No murmur heard. Pulmonary/Chest: Breath sounds normal. She has no wheezes. She has no rales.  Abdominal: Soft. Bowel sounds are normal. She exhibits no distension. There is no tenderness.  Musculoskeletal:  Swelling on right wrist has resolved with only minor erythema remaining  Neurological: She is alert.    Lab Results: CBC    Component Value Date/Time   WBC 6.8 01/13/2013 0400   RBC 4.08 01/13/2013 0400   HGB 12.4 01/13/2013 0400   HCT 36.7 01/13/2013 0400   PLT 223 01/13/2013 0400   MCV 90.0 01/13/2013 0400   MCH 30.4 01/13/2013 0400   MCHC 33.8 01/13/2013 0400   RDW 13.9 01/13/2013 0400   LYMPHSABS 1.0 01/13/2013 0400   MONOABS 0.6 01/13/2013 0400   EOSABS 0.2 01/13/2013 0400   BASOSABS 0.0 01/13/2013 0400   CMP     Component Value Date/Time   NA 134* 01/13/2013 1135   K 5.5* 01/13/2013 1135   CL 105 01/13/2013 1135   CO2 19 01/13/2013 1135   GLUCOSE 87 01/13/2013 1135   BUN 8 01/13/2013 1135   CREATININE 0.49* 01/13/2013 1135   CALCIUM 8.3* 01/13/2013 1135   PROT 5.9* 01/10/2013 1524   ALBUMIN 2.3* 01/10/2013 1524   AST 18 01/10/2013 1524   ALT 11 01/10/2013 1524   ALKPHOS 96 01/10/2013 1524   BILITOT 0.9 01/10/2013 1524   GFRNONAA >90 01/13/2013 1135   GFRAA >90 01/13/2013 1135     Micro Results: Recent Results (from the past 240 hour(s))  URINE CULTURE     Status: None   Collection Time    01/04/13  5:51 PM      Result Value Range Status   Specimen Description URINE, CATHETERIZED   Final   Special Requests NONE   Final   Culture  Setup Time 01/04/2013 22:48   Final   Colony Count >=100,000 COLONIES/ML   Final   Culture     Final   Value: Multiple bacterial morphotypes present, none predominant. Suggest appropriate recollection if clinically indicated.   Report Status 01/05/2013 FINAL   Final  CULTURE, BLOOD (ROUTINE X 2)     Status: None   Collection Time  01/04/13  6:20 PM      Result Value Range Status   Specimen Description BLOOD ARM LEFT   Final   Special Requests BOTTLES DRAWN AEROBIC ONLY 5CC   Final   Culture  Setup Time     Final   Value: 01/04/2013 22:23     Performed at [facility]   Culture     Final   Value: NO GROWTH 5 DAYS     Performed at [facility]   Report Status 01/10/2013 FINAL   Final  CULTURE, BLOOD (ROUTINE X 2)     Status: None   Collection Time    01/04/13  6:28 PM      Result Value Range Status   Specimen Description BLOOD HAND LEFT   Final   Special Requests BOTTLES DRAWN AEROBIC ONLY 1CC   Final   Culture  Setup Time     Final   Value: 01/05/2013 01:32     Performed at [facility]   Culture     Final   Value: NO GROWTH 5 DAYS     Performed at [facility]   Report Status 01/11/2013 FINAL   Final  URINE CULTURE     Status: None   Collection Time    01/06/13  2:13 PM       Result Value Range Status   Specimen Description URINE, CATHETERIZED   Final   Special Requests NONE   Final   Culture  Setup Time     Final   Value: 01/06/2013 21:58     Performed at [facility] Count     Final   Value: >=100,000 COLONIES/ML     Performed at [facility]   Culture     Final   Value: YEAST     Performed at [facility]   Report Status 01/07/2013 FINAL   Final  URINE CULTURE     Status: None   Collection Time    01/10/13  5:20 PM      Result Value Range Status   Specimen Description URINE, CATHETERIZED   Final   Special Requests NONE   Final   Culture  Setup Time     Final   Value: 01/10/2013 19:10     Performed at [facility] Count     Final   Value: 75,000 COLONIES/ML     Performed at [facility]   Culture     Final   Value: ENTEROCOCCUS SPECIES     Performed at [facility]   Report Status 01/12/2013 FINAL   Final   Organism ID, Bacteria ENTEROCOCCUS SPECIES   Final  CULTURE, BLOOD (ROUTINE X 2)     Status: None   Collection Time    01/10/13  5:30 PM      Result Value Range Status   Specimen Description BLOOD ARM LEFT   Final   Special Requests BOTTLES DRAWN AEROBIC AND ANAEROBIC 10CC   Final   Culture  Setup Time     Final   Value: 01/10/2013 23:26     Performed at [facility]   Culture     Final   Value:        BLOOD CULTURE RECEIVED NO GROWTH TO DATE CULTURE WILL BE HELD FOR 5 DAYS BEFORE ISSUING A FINAL NEGATIVE REPORT     Performed at [facility]   Report Status PENDING   Incomplete  CULTURE, BLOOD (ROUTINE X 2)     Status: None   Collection Time    01/10/13  5:39 PM      Result Value Range Status   Specimen Description BLOOD HAND LEFT   Final   Special Requests BOTTLES DRAWN AEROBIC ONLY 8CC   Final   Culture  Setup Time     Final   Value: 01/10/2013 23:26     Performed at [facility]   Culture     Final   Value:        BLOOD CULTURE RECEIVED NO  GROWTH TO DATE CULTURE WILL BE HELD FOR 5 DAYS BEFORE ISSUING A FINAL NEGATIVE REPORT     Performed at [facility]   Report Status PENDING   Incomplete   Studies/Results: No results found. Medications: I have reviewed the patient's current medications. Scheduled Meds:  antiseptic oral rinse  15 mL Mouth Rinse q12n4p   aztreonam  2 g Intravenous Q8H   budesonide-formoterol  2 puff Inhalation BID   chlorhexidine  15 mL Mouth Rinse BID   donepezil  5 mg Oral QHS   haloperidol lactate  1 mg Intravenous Once   lisinopril  10 mg Oral Daily   vancomycin  500 mg Intravenous Q12H   warfarin  2 mg Oral ONCE-1800   Warfarin - Pharmacist Dosing Inpatient   Does not apply q1800   Continuous Infusions:  0.9 % NaCl with KCl 40 mEq / L 125 mL/hr at 01/13/13 0651   PRN Meds:.RESOURCE THICKENUP CLEAR Assessment/Plan: Principal Problem:   HCAP (healthcare-associated pneumonia) Active Problems:   Atrial fibrillation   Hypertension   Urinary tract infection  Assessment/Plan:  Principal Problem:  HCAP (healthcare-associated pneumonia)  Active Problems:  Atrial fibrillation  Hypertension  Urinary tract infection   Ms. Coley Littles is a 77 year old lady with a PMHx of atrial fibrillation, hypertension, and dementia who presents with generalized weakness and left lower lobe HCAP.  **HCAP  Chest x-ray shows left lower lobe infiltrate. Bilateral pleural effusions noted on abdominal ultrasound and chest CT. Recently hospitalized at [facility] so we will treat for HCAP. Breath sounds are clearing on physical exam.  - Aztreonam and vancomycin IV for HCAP (anaphylaxis to penicillin)  - Follow-up blood cultures, sputum culture and gram stain  - Negative for strep pneumo urine antigen, legionella urine antigen and HIV  - SLP complete: on Dysphagia 1 diet (puree with nectar thick liquid)  **Weakness/Decreased Appetite  Likely multifactorial as patient is deconditioned  from multiple recent hospital stays, has infection (HCAP) and meets criteria for severe malnutrition. No signs of stroke on CT or focal neurologic deficits.  - NS infusion @125ml /hr  - Treating HCAP as above  - Nutrition and SLP consulted and recommendations appreciated  - Diet - Dysphagia 1 (puree) and nectar thick liquids, with added supplement  - Will continue to monitor electrolytes and replete as necessary  **Altered Mental Status  Stable since discharge. She has been alert to person only. She probably has undergone a worsening of her underlying dementia and will not return to her baseline of living independently, and family is aware of this. Subacute stroke is important to consider given history of stepwise decline, especially with the suggestion of left atrial filling defect on CT chest.  - Consider brain MRI  - Resumed donepezil  **Atrial fibrillation  Patient is on warfarin. INR 2.77 today. Possible small left atrial filling defect on CT. HR bradycardic to 45 on  Friday 8/8.  - Continue telemetry  - Warfarin per pharmacy.  - Holding metoprolol, digoxin due to recent bradycardia.  **HTN  BP ranging (140-160)/(50-100) despite resuming lisinopril  - Consider adding an additional antihypertensive medication **DVT PPX  Patient is on warfarin  **Dispo  PT recommends patient discharge to SNF      This is a Psychologist, occupational Note.  The care of the patient was discussed with Dr. Andrey Campanile and the assessment and plan formulated with their assistance.  Please see their attached note for official documentation of the daily encounter.   LOS: 3 days   Merrie Roof, MS3 01/13/2013, 1:24 PM

## 2013-01-13 NOTE — Progress Notes (Signed)
I have discussed with Student Doctor Yetta Barre and agree with his findings and plan.

## 2013-01-13 NOTE — Progress Notes (Signed)
S: Went to see patient after evening BMP showed K>7.5. Patient has no new complaints.  O: Filed Vitals:   01/13/13 2101  BP: 160/119  Pulse: 134  Temp: 98.5 F (36.9 C)  Resp: 18   Physical Exam  Constitutional: No distress.  Cardiovascular: An irregularly irregular rhythm present.  Right arm edema (hx of RUE DVT). No LE edema.  Pulmonary/Chest: Effort normal and breath sounds normal. She has no wheezes. She has no rales.  Exam limited by poor effort.  Abdominal: Soft. There is no tenderness.  Neurological: She is alert.  Oriented to self. GCS 14. (Baseline)   Stat EKG @2334 : Atrial fibrillation. Rate 134. Normal axis. QRS duration 80ms. QTc 394. Baseline T-wave morphology. No new conduction abnormalities.   A/P: Hyperkalemia. No worrisome EKG changes. Baseline physical exam and mental status. Unclear etiology. We are holding her digoxin in the hospital. Taking home dose lisinopril 10mg  daily. Other meds not know to cause hyperkalemia. Slightly acidotic. Normal renal function. Lab error i.e. hemolyzed specimen on the differential. - Putting her on telemetry - Stat repeat K - Repeat BMP @0200  and q4hr after that - Kayexalate 15g po - Calcium gluconate 2g - Insulin novolog 10u with dextrose 1 amp - Sodium bicarb 1 amp - Albuterol neb - NS 500cc bolus - Changed maintenance fluids to NS 123ml/hr - Holding lasix for now - will give if no improvement with above therapies - Will d/c lisinopril   Vivi Barrack, MD [phone]

## 2013-01-14 DIAGNOSIS — E43 Unspecified severe protein-calorie malnutrition: Secondary | ICD-10-CM

## 2013-01-14 DIAGNOSIS — N390 Urinary tract infection, site not specified: Secondary | ICD-10-CM

## 2013-01-14 LAB — BASIC METABOLIC PANEL
BUN: 7 mg/dL (ref 6–23)
BUN: 7 mg/dL (ref 6–23)
BUN: 7 mg/dL (ref 6–23)
BUN: 8 mg/dL (ref 6–23)
CO2: 23 mEq/L (ref 19–32)
CO2: 21 mEq/L (ref 19–32)
CO2: 19 mEq/L (ref 19–32)
CO2: 20 mEq/L (ref 19–32)
Calcium: 8.6 mg/dL (ref 8.4–10.5)
Chloride: 109 mEq/L (ref 96–112)
Chloride: 110 mEq/L (ref 96–112)
Chloride: 109 mEq/L (ref 96–112)
Chloride: 108 mEq/L (ref 96–112)
Chloride: 108 mEq/L (ref 96–112)
Creatinine, Ser: 0.54 mg/dL (ref 0.50–1.10)
Creatinine, Ser: 0.54 mg/dL (ref 0.50–1.10)
GFR calc Af Amer: >90 mL/min (ref >90)
GFR calc Af Amer: >90 mL/min (ref >90)
GFR calc Af Amer: >90 mL/min (ref >90)
GFR calc non Af Amer: 88 mL/min — ABNORMAL LOW (ref >90)
Glucose, Bld: 76 mg/dL (ref 70–99)
Glucose, Bld: 90 mg/dL (ref 70–99)
Glucose, Bld: 91 mg/dL (ref 70–99)
Glucose, Bld: 106 mg/dL — ABNORMAL HIGH (ref 70–99)
Potassium: 4.7 mEq/L (ref 3.5–5.1)
Potassium: 4.7 mEq/L (ref 3.5–5.1)
Potassium: 4.4 mEq/L (ref 3.5–5.1)
Potassium: 4.4 mEq/L (ref 3.5–5.1)
Sodium: 139 mEq/L (ref 135–145)
Sodium: 140 mEq/L (ref 135–145)
Sodium: 138 mEq/L (ref 135–145)

## 2013-01-14 LAB — CBC WITH DIFFERENTIAL/PLATELET
Basophils Absolute: 0 10*3/uL (ref 0.0–0.1)
Basophils Relative: 0 % (ref 0–1)
Eosinophils Absolute: 0.1 10*3/uL (ref 0.0–0.7)
Eosinophils Relative: 2 % (ref 0–5)
HCT: 36.5 % (ref 36.0–46.0)
MCHC: 33.4 g/dL (ref 30.0–36.0)
Monocytes Absolute: 0.4 10*3/uL (ref 0.1–1.0)
Neutro Abs: 4.3 10*3/uL (ref 1.7–7.7)
RDW: 13.9 % (ref 11.5–15.5)

## 2013-01-14 LAB — PROTIME-INR
INR: 2.55 — ABNORMAL HIGH (ref 0.00–1.49)
Prothrombin Time: 24.7 seconds — ABNORMAL HIGH (ref 11.6–15.2)

## 2013-01-14 MED ORDER — METOPROLOL TARTRATE 1 MG/ML IV SOLN: 5.0000 mg | Freq: Once | INTRAVENOUS | Status: DC

## 2013-01-14 MED ORDER — METOPROLOL TARTRATE 1 MG/ML IV SOLN
5.0000 mg | INTRAVENOUS | Status: AC
  Administered 2013-01-14: 5 mg via INTRAVENOUS
  Filled 2013-01-14: qty 5

## 2013-01-14 MED ORDER — WARFARIN SODIUM 2.5 MG PO TABS
2.5000 mg | ORAL_TABLET | Freq: Once | ORAL | Status: AC
  Administered 2013-01-14: 2.5 mg via ORAL
  Filled 2013-01-14: qty 1

## 2013-01-14 MED ORDER — METOPROLOL TARTRATE 12.5 MG HALF TABLET
12.5000 mg | ORAL_TABLET | Freq: Two times a day (BID) | ORAL | Status: DC
  Administered 2013-01-14: 12.5 mg via ORAL
  Filled 2013-01-14 (×2): qty 1

## 2013-01-14 MED ORDER — METOPROLOL TARTRATE 12.5 MG HALF TABLET
12.5000 mg | ORAL_TABLET | Freq: Once | ORAL | Status: AC
  Administered 2013-01-14: 12.5 mg via ORAL
  Filled 2013-01-14: qty 1

## 2013-01-14 MED ORDER — METOPROLOL TARTRATE 25 MG PO TABS
25.0000 mg | ORAL_TABLET | Freq: Two times a day (BID) | ORAL | Status: DC
  Administered 2013-01-14: 25 mg via ORAL
  Filled 2013-01-14 (×3): qty 1

## 2013-01-14 MED ORDER — ENSURE PUDDING PO PUDG
1.0000 | Freq: Three times a day (TID) | ORAL | Status: DC
  Administered 2013-01-14 (×2): 1 via ORAL

## 2013-01-14 NOTE — Progress Notes (Signed)
CRITICAL VALUE ALERT  Critical value received:  K+ > 7.5  Date of notification:  01/14/13  Time of notification:  23:15  Critical value read back: yes  Nurse who received alert:  Sharen Heck  MD notified (1st page):    Time of first page:  23:23  MD notified (2nd page):  Time of second page:  Responding MD:    Time MD responded:  23:25

## 2013-01-14 NOTE — Progress Notes (Addendum)
ANTICOAGULATION & ANTIBIOTIC CONSULT NOTE - Follow Up Consult  Pharmacy Consult for Warfarin & Vancomycin + Azactam per MD Indication: Hx Afib and r/o HCAP  Allergies  Allergen Reactions  . Penicillins Anaphylaxis and Swelling  . Ambien (Zolpidem Tartrate)     tachycardia    . Aspirin Nausea And Vomiting  . Codeine Itching  . Tartrazine Nausea Only    Patient Measurements: Height: 5\' 6"  (167.6 cm) Weight: 143 lb 3.2 oz (64.955 kg) IBW/kg (Calculated) : 59.3  Vital Signs: Temp: 97.7 F (36.5 C) (08/11 0425) Temp src: Oral (08/11 0425) BP: 130/72 mmHg (08/11 0425) Pulse Rate: 85 (08/11 0425)  Labs:  Recent Labs  01/12/13 0420 01/13/13 0400  01/14/13 0233 01/14/13 0656 01/14/13 1020  HGB 11.6* 12.4  --   --  12.2  --   HCT 33.8* 36.7  --   --  36.5  --   PLT 206 223  --   --  237  --   LABPROT 31.3* 28.3*  --   --  26.6* 24.7*  INR 3.16* 2.77*  --   --  2.55* 2.32*  CREATININE 0.51 0.52  < > 0.58 0.54 0.54  < > = values in this interval not displayed.  Estimated Creatinine Clearance: 56.9 ml/min (by C-G formula based on Cr of 0.54).   Assessment: 77 y.o. F on warfarin for hx Afib with a therapeutic INR this morning though trending down (INR 2.55 << 2.77, goal of 2-3). Hgb/Hct/Plt stable, no overt s/sx of bleeding noted. The patient is still eating poorly -- which will increase warfarin sensitivity.  Today is Vancomycin-Rx + Azactam-MD D#5 for empiric HCAP coverage. Afebrile, WBC wnl, SCr 0.54, CrCl~50-60 ml/min. Vancomycin dose remains appropriate at this time.  Goal of Therapy:  INR 2-3 Monitor platelets by anticoagulation protocol: Yes   Plan:  1. Warfarin 2.5 mg x 1 dose at 1800 today 2. Continue Vancomycin 500 mg IV every 12 hours 3. Will continue to monitor for any signs/symptoms of bleeding and will follow up with PT/INR in the a.m.   Georgina Pillion, PharmD, BCPS Clinical Pharmacist Pager: [phone] 01/14/2013 12:42 PM

## 2013-01-14 NOTE — Progress Notes (Signed)
Subjective: Ms. Clemson was seen and examined by me this AM.  She still is not eating much.  She denies CP/SOB/fever/pain.  Objective: Vital signs in last 24 hours: Filed Vitals:   01/14/13 0425 01/14/13 0930 01/14/13 1935 01/14/13 2029  BP: 130/72 144/92  152/94  Pulse: 85 90  102  Temp: 97.7 F (36.5 C) 97.5 F (36.4 C)  98.3 F (36.8 C)  TempSrc: Oral Axillary  Axillary  Resp: 18 18  18   Height:    5\' 6"  (1.676 m)  Weight:    67.495 kg (148 lb 12.8 oz)  SpO2: 96% 97% 94% 95%   Weight change: -0.545 kg (-1 lb 3.2 oz)  Intake/Output Summary (Last 24 hours) at 01/14/13 2043 Last data filed at 01/14/13 1100  Gross per 24 hour  Intake    525 ml  Output    550 ml  Net    -25 ml   General: resting in bed in NAD HEENT: PERRL, no scleral icterus, dry mucous membranes Cardiac: irregularly irregular rhythym Pulm: clear to auscultation bilaterally, but poor effort Abd: soft, nontender, nondistended, BS present Ext: no pedal edema, RUE edema improving Neuro: alert and oriented to self, family  Lab Results: Basic Metabolic Panel:  Recent Labs Lab 01/10/13 2307 01/11/13 0520 01/12/13 0420  01/14/13 1413 01/14/13 1720  NA  --  137 136  < > 138 137  K  --  3.7 4.2  < > 4.4 4.4  CL  --  104 105  < > 108 108  CO2  --  22 21  < > 19 20  GLUCOSE  --  77 81  < > 91 106*  BUN  --  10 9  < > 7 8  CREATININE  --  0.55 0.51  < > 0.52 0.54  CALCIUM  --  8.0* 8.1*  < > 8.5 8.6  MG 1.2*  --  1.9  --   --   --   PHOS  --   --  2.4  --   --   --   < > = values in this interval not displayed. Liver Function Tests:  Recent Labs Lab 01/10/13 1524  AST 18  ALT 11  ALKPHOS 96  BILITOT 0.9  PROT 5.9*  ALBUMIN 2.3*   CBC:  Recent Labs Lab 01/13/13 0400 01/14/13 0656  WBC 6.8 5.8  NEUTROABS 5.0 4.3  HGB 12.4 12.2  HCT 36.7 36.5  MCV 90.0 90.3  PLT 223 237   Cardiac Enzymes:  Recent Labs Lab 01/10/13 1524  TROPONINI <0.30   Coagulation:  Recent Labs Lab  01/12/13 0420 01/13/13 0400 01/14/13 0656 01/14/13 1020  LABPROT 31.3* 28.3* 26.6* 24.7*  INR 3.16* 2.77* 2.55* 2.32*   Urinalysis:  Recent Labs Lab 01/10/13 1720  COLORURINE AMBER*  LABSPEC 1.017  PHURINE 5.5  GLUCOSEU NEGATIVE  HGBUR SMALL*  BILIRUBINUR LARGE*  KETONESUR 40*  PROTEINUR 30*  UROBILINOGEN 1.0  NITRITE NEGATIVE  LEUKOCYTESUR MODERATE*    Micro Results: Recent Results (from the past 240 hour(s))  URINE CULTURE     Status: None   Collection Time    01/06/13  2:13 PM      Result Value Range Status   Specimen Description URINE, CATHETERIZED   Final   Special Requests NONE   Final   Culture  Setup Time     Final   Value: 01/06/2013 21:58     Performed at [facility] Count  Final   Value: >=100,000 COLONIES/ML     Performed at [facility]   Culture     Final   Value: YEAST     Performed at [facility]   Report Status 01/07/2013 FINAL   Final  URINE CULTURE     Status: None   Collection Time    01/10/13  5:20 PM      Result Value Range Status   Specimen Description URINE, CATHETERIZED   Final   Special Requests NONE   Final   Culture  Setup Time     Final   Value: 01/10/2013 19:10     Performed at [facility] Count     Final   Value: 75,000 COLONIES/ML     Performed at [facility]   Culture     Final   Value: ENTEROCOCCUS SPECIES     Performed at [facility]   Report Status 01/12/2013 FINAL   Final   Organism ID, Bacteria ENTEROCOCCUS SPECIES   Final  CULTURE, BLOOD (ROUTINE X 2)     Status: None   Collection Time    01/10/13  5:30 PM      Result Value Range Status   Specimen Description BLOOD ARM LEFT   Final   Special Requests BOTTLES DRAWN AEROBIC AND ANAEROBIC 10CC   Final   Culture  Setup Time     Final   Value: 01/10/2013 23:26     Performed at [facility]   Culture     Final   Value:        BLOOD CULTURE RECEIVED NO GROWTH TO DATE CULTURE WILL BE  HELD FOR 5 DAYS BEFORE ISSUING A FINAL NEGATIVE REPORT     Performed at [facility]   Report Status PENDING   Incomplete  CULTURE, BLOOD (ROUTINE X 2)     Status: None   Collection Time    01/10/13  5:39 PM      Result Value Range Status   Specimen Description BLOOD HAND LEFT   Final   Special Requests BOTTLES DRAWN AEROBIC ONLY 8CC   Final   Culture  Setup Time     Final   Value: 01/10/2013 23:26     Performed at [facility]   Culture     Final   Value:        BLOOD CULTURE RECEIVED NO GROWTH TO DATE CULTURE WILL BE HELD FOR 5 DAYS BEFORE ISSUING A FINAL NEGATIVE REPORT     Performed at [facility]   Report Status PENDING   Incomplete   Studies/Results: No results found. Medications: I have reviewed the patient's current medications. Scheduled Meds: . antiseptic oral rinse  15 mL Mouth Rinse q12n4p  . aztreonam  2 g Intravenous Q8H  . budesonide-formoterol  2 puff Inhalation BID  . chlorhexidine  15 mL Mouth Rinse BID  . donepezil  5 mg Oral QHS  . feeding supplement  1 Container Oral TID BM  . haloperidol lactate  1 mg Intravenous Once  . metoprolol tartrate  25 mg Oral BID  . vancomycin  500 mg Intravenous Q12H  . Warfarin - Pharmacist Dosing Inpatient   Does not apply q1800   Continuous Infusions: . sodium chloride Stopped (01/14/13 0603)   PRN Meds:.RESOURCE THICKENUP CLEAR  Assessment/Plan: [name] is a 77 year old female with past medical history of atrial fibrillation on Coumadin, digoxin and metoprolol; hypertension; and a recent hospitalization here  at [facility] for urinary tract infection with altered mental status, who presents today with a chief complaint of diffuse weakness and productive cough, found to have a left lower lobe infiltrate on chest x-ray.   #Health care-acquired pneumonia - Chest x-ray shows left lower lobe infiltrate. Bilateral pleural effusions noted on abdominal ultrasound and chest CT. This clinical picture most  likely represents a health care-acquired pneumonia given her many recent hospitalizations. Symptoms have resolved.  - continue Aztreonam and vancomycin IV for HCAP (anaphylaxis to penicillin)  - Follow-up blood cultures, sputum culture and gram stain  #UTI - repeat urine culture positive for enterococcus  - patient already on vancomycin - foley discontinued   #Decreased appetite - calorie count started today  #Diffuse weakness - Likely multifactorial as patient is deconditioned from multiple recent hospital stays, has infection (HCAP) and meets criteria for severe malnutrition. No signs of stroke on CT or focal neurologic deficits.  - NS infusion @100ml /her  - Treating HCAP as above  - Nutrition and SLP consulted and recommendations appreciated  - Diet - Dysphagia 1 (puree) and nectar thick liquids, with added supplement  - Will continue to monitor electrolytes and replete as necessary   #Delirium/encephalopathy - Stable since discharge. She remains confused and disoriented, but alert and non-agitated. Likely multifactorial etiology for same reasons as above. She probably has undergone a worsening of her underlying dementia and will not return to her baseline of living independently, and family is aware of this. Subacute stroke is important to consider given history of stepwise decline, especially with the suggestion of left atrial filling defect on CT chest. Not currently agitated or delirious.  - continue Aricept   #Atrial fibrillation - Patient is therapeutic on coumadin (INR = 2.77). Possible small left atrial filling defect on CT.  - Coumadin per pharmacy  - metoprolol 12.5mg  BID resumed today, increased to 25mg  BID as pt tachy into the 170 this evening, holding dig  #HTN - BP up to 130s/70s - 150s/90s today.  - hold lisinopril, continue metoprolol 25mg  BID   #COPD - Stable  - continue Symbicort inhaler   #Hyperkalemia - K elevated 7.5 last night, no EKG changes.  Pt given Calcium,  bicarb, albuterol neb, insulin w/dextrose, Kayexalate.  Maintenance fluids had K in them so discontinued and switched to NS.  Lisinopril also discontinued in case it was contributing.  K today - 5.0 -->4.7-->4.5-->4.4. - q4 BMPs -NS  #Heart murmer - Soft holosystolic murmur best heard at the apex. History of mitral regurgitation. Afebrile. No stigmata of endocarditis on exam.  - Blood cultures pending - no vegetations on echo   #DVT PPX - coumadin  Dispo: Disposition is deferred at this time, awaiting improvement of current medical problems.  Anticipated discharge in approximately 1-2 day(s).   The patient does have a current PCP (S Gillermo Murdoch, MD) and does need an [facility] hospital follow-up appointment after discharge.  The patient does not have transportation limitations that hinder transportation to clinic appointments.  .Services Needed at time of discharge: Y = Yes, Blank = No PT:   OT:   RN:   Equipment:   Other:     LOS: 4 days   Evelena Peat, DO 01/14/2013, 8:43 PM

## 2013-01-14 NOTE — Progress Notes (Signed)
Pt refused to have her BMP lab drawn. Pt is very confused though. Will notify md.

## 2013-01-14 NOTE — Progress Notes (Signed)
Patient ID: Ashley Rubio, female   DOB: 13-Dec-1935, 77 y.o.   MRN: [id number]   Subjective: Yesterday Ashley Rubio had a potassium level of 5.5 with no changes on EKG. We ordered a repeat BMP at 22:00 and it showed a K of 7.5. There were no EKG changes at that time. The night team gave kayexalate, calcium, insulin with dextrose, bicarb, and albuterol neb. It was also discovered by the night team that her maintenance fluids contained potassium and they changed maintenance fluids to normal saline. She also became tachycardic around 01:00 to the 150s-160s and was hypertensive (170s SBP). The night team gave her metoprolol 5 mg IV x 2 with good response.   This morning she tells [place] that she "doesn't feel right," but was unable to elaborate. She stated that she had a bit of a cough but otherwise denied headache, chest pain, shortness of breath, and dysuria. Her appetite is still decreased to absent. She had not eaten her breakfast when we saw her this morning. Objective: Vital signs in last 24 hours: Filed Vitals:   01/14/13 0129 01/14/13 0308 01/14/13 0425 01/14/13 0930  BP: 120/90 142/89 130/72 144/92  Pulse:   85 90  Temp:   97.7 F (36.5 C) 97.5 F (36.4 C)  TempSrc:   Oral Axillary  Resp:   18 18  Height:      Weight:      SpO2:   96% 97%   Weight change: -0.545 kg (-1 lb 3.2 oz)  Intake/Output Summary (Last 24 hours) at 01/14/13 1444 Last data filed at 01/14/13 1100  Gross per 24 hour  Intake   2000 ml  Output   1050 ml  Net    950 ml   Physical Exam  Constitutional: No distress.  HENT:  Head: Normocephalic and atraumatic.  Mucous membranes are moderately dry  Eyes: Pupils are equal, round, and reactive to light.  Cardiovascular: An irregularly irregular rhythm present. Exam reveals no gallop and no friction rub.   No murmur heard. Pulmonary/Chest: Breath sounds normal.  Decreased respiratory effort  Abdominal: Soft. Bowel sounds are normal. She exhibits no distension. There  is no tenderness.  Neurological: She is alert.  She is oriented to person only    Lab Results: CBC    Component Value Date/Time   WBC 5.8 01/14/2013 0656   RBC 4.04 01/14/2013 0656   HGB 12.2 01/14/2013 0656   HCT 36.5 01/14/2013 0656   PLT 237 01/14/2013 0656   MCV 90.3 01/14/2013 0656   MCH 30.2 01/14/2013 0656   MCHC 33.4 01/14/2013 0656   RDW 13.9 01/14/2013 0656   LYMPHSABS 1.0 01/14/2013 0656   MONOABS 0.4 01/14/2013 0656   EOSABS 0.1 01/14/2013 0656   BASOSABS 0.0 01/14/2013 0656    CMP     Component Value Date/Time   NA 140 01/14/2013 1020   K 4.5 01/14/2013 1020   CL 109 01/14/2013 1020   CO2 21 01/14/2013 1020   GLUCOSE 90 01/14/2013 1020   BUN 7 01/14/2013 1020   CREATININE 0.54 01/14/2013 1020   CALCIUM 8.6 01/14/2013 1020   PROT 5.9* 01/10/2013 1524   ALBUMIN 2.3* 01/10/2013 1524   AST 18 01/10/2013 1524   ALT 11 01/10/2013 1524   ALKPHOS 96 01/10/2013 1524   BILITOT 0.9 01/10/2013 1524   GFRNONAA >90 01/14/2013 1020   GFRAA >90 01/14/2013 1020     Micro Results: Recent Results (from the past 240 hour(s))  URINE CULTURE  Status: None   Collection Time    01/04/13  5:51 PM      Result Value Range Status   Specimen Description URINE, CATHETERIZED   Final   Special Requests NONE   Final   Culture  Setup Time 01/04/2013 22:48   Final   Colony Count >=100,000 COLONIES/ML   Final   Culture     Final   Value: Multiple bacterial morphotypes present, none predominant. Suggest appropriate recollection if clinically indicated.   Report Status 01/05/2013 FINAL   Final  CULTURE, BLOOD (ROUTINE X 2)     Status: None   Collection Time    01/04/13  6:20 PM      Result Value Range Status   Specimen Description BLOOD ARM LEFT   Final   Special Requests BOTTLES DRAWN AEROBIC ONLY 5CC   Final   Culture  Setup Time     Final   Value: 01/04/2013 22:23     Performed at [facility]   Culture     Final   Value: NO GROWTH 5 DAYS     Performed at [facility]   Report Status  01/10/2013 FINAL   Final  CULTURE, BLOOD (ROUTINE X 2)     Status: None   Collection Time    01/04/13  6:28 PM      Result Value Range Status   Specimen Description BLOOD HAND LEFT   Final   Special Requests BOTTLES DRAWN AEROBIC ONLY 1CC   Final   Culture  Setup Time     Final   Value: 01/05/2013 01:32     Performed at [facility]   Culture     Final   Value: NO GROWTH 5 DAYS     Performed at [facility]   Report Status 01/11/2013 FINAL   Final  URINE CULTURE     Status: None   Collection Time    01/06/13  2:13 PM      Result Value Range Status   Specimen Description URINE, CATHETERIZED   Final   Special Requests NONE   Final   Culture  Setup Time     Final   Value: 01/06/2013 21:58     Performed at [facility] Count     Final   Value: >=100,000 COLONIES/ML     Performed at [facility]   Culture     Final   Value: YEAST     Performed at [facility]   Report Status 01/07/2013 FINAL   Final  URINE CULTURE     Status: None   Collection Time    01/10/13  5:20 PM      Result Value Range Status   Specimen Description URINE, CATHETERIZED   Final   Special Requests NONE   Final   Culture  Setup Time     Final   Value: 01/10/2013 19:10     Performed at [facility] Count     Final   Value: 75,000 COLONIES/ML     Performed at [facility]   Culture     Final   Value: ENTEROCOCCUS SPECIES     Performed at [facility]   Report Status 01/12/2013 FINAL   Final   Organism ID, Bacteria ENTEROCOCCUS SPECIES   Final  CULTURE, BLOOD (ROUTINE X 2)     Status: None   Collection Time    01/10/13  5:30 PM  Result Value Range Status   Specimen Description BLOOD ARM LEFT   Final   Special Requests BOTTLES DRAWN AEROBIC AND ANAEROBIC 10CC   Final   Culture  Setup Time     Final   Value: 01/10/2013 23:26     Performed at [facility]   Culture     Final   Value:        BLOOD CULTURE  RECEIVED NO GROWTH TO DATE CULTURE WILL BE HELD FOR 5 DAYS BEFORE ISSUING A FINAL NEGATIVE REPORT     Performed at [facility]   Report Status PENDING   Incomplete  CULTURE, BLOOD (ROUTINE X 2)     Status: None   Collection Time    01/10/13  5:39 PM      Result Value Range Status   Specimen Description BLOOD HAND LEFT   Final   Special Requests BOTTLES DRAWN AEROBIC ONLY 8CC   Final   Culture  Setup Time     Final   Value: 01/10/2013 23:26     Performed at [facility]   Culture     Final   Value:        BLOOD CULTURE RECEIVED NO GROWTH TO DATE CULTURE WILL BE HELD FOR 5 DAYS BEFORE ISSUING A FINAL NEGATIVE REPORT     Performed at [facility]   Report Status PENDING   Incomplete   Studies/Results: No results found. Medications: I have reviewed the patient's current medications. Scheduled Meds:  antiseptic oral rinse  15 mL Mouth Rinse q12n4p   aztreonam  2 g Intravenous Q8H   budesonide-formoterol  2 puff Inhalation BID   chlorhexidine  15 mL Mouth Rinse BID   donepezil  5 mg Oral QHS   feeding supplement  1 Container Oral TID BM   haloperidol lactate  1 mg Intravenous Once   metoprolol tartrate  12.5 mg Oral BID   vancomycin  500 mg Intravenous Q12H   warfarin  2.5 mg Oral ONCE-1800   Warfarin - Pharmacist Dosing Inpatient   Does not apply q1800   Continuous Infusions:  sodium chloride Stopped (01/14/13 0603)   PRN Meds:.RESOURCE THICKENUP CLEAR Assessment/Plan: Principal Problem:   HCAP (healthcare-associated pneumonia) Active Problems:   Atrial fibrillation   Hypertension   Urinary tract infection  Ashley Rubio is a 76 year old lady with a PMHx of atrial fibrillation, hypertension, and dementia who presents with generalized weakness and left lower lobe HCAP.  **HCAP  Chest x-ray shows left lower lobe infiltrate. Bilateral pleural effusions noted on abdominal ultrasound and chest CT. Recently hospitalized at [facility] so we will treat for HCAP. Breath sounds are clearing on physical exam.  - Aztreonam and vancomycin IV for HCAP (anaphylaxis to penicillin)  - Follow-up blood cultures, sputum culture and gram stain  - Negative for strep pneumo urine antigen, legionella urine antigen and HIV  - SLP complete: on Dysphagia 1 diet (puree with nectar thick liquid)  **Iatrogenic Hyperkalemia We have stopped her maintenance fluids containing potassium, resolved her hyperkalemia, and held her lisinopril. Although her BP is elevated, it has ranged (120s-160s)/(70s-110s) over the past 24 hours, so it would be reasonable to continue holding her lisinopril given recent iatrogenic hyperkalemia. -Monitor electrolytes and treat hyperkalemia if present -Continue normal saline maintenance fluids -Continue to hold lisinopril **Decreased Appetite/Malnourishment  Subjectively, she appears to not be eating her food or consuming sufficient p.o. liquids. If she is to be discharged to SNF or  home care we need an objective goal for her nutrition. -Nutrition consulted and recommend the following with the goal of intake to meet >/=90% estimated nutrition needs: 1. Ensure Pudding po TID, each supplement provides 170 kcal and 4 grams of protein.  2. Calorie Count started, RD will follow up on 8/13 with results.  3. Given poor intake, pt remains at increased risk for refeeding syndrome. Recommend monitor Magnesium, Potassium, and Phosphorous.  -Monitor PO intake, weight trends, labs  **Weakness Likely multifactorial as patient is deconditioned from multiple recent hospital stays, has infection (HCAP) and meets criteria for severe malnutrition. No signs of stroke on CT or focal neurologic deficits.  - NS infusion @125ml /hr  - Treating HCAP as above  - Will continue to monitor electrolytes and replete as necessary  - Arrange for PT/OT  **Altered Mental Status  Stable since discharge. She has been alert to person only. She probably  has undergone a worsening of her underlying dementia and will not return to her baseline of living independently, and family is aware of this. Subacute stroke is important to consider given history of stepwise decline, especially with the suggestion of left atrial filling defect on CT chest.  - Resumed donepezil  - Haldol prn for agitation **Atrial fibrillation/Tachycardia/Bradycardia  Patient is on warfarin. INR 2.32 today. Possible small left atrial filling defect on CT. She has had episodes of tachycardia and bradycardia. HR bradycardic to 45 on Friday 8/8.  Will check telemetry to investigate further as she could have sick sinus syndrome.  - Continue telemetry and review for episodes of tachycardia/bradycardia -- consider cards consult. - Warfarin per pharmacy.  - Resume metoprolol at 12.5 mg BID for rate control **HTN  BP ranging (120s-160s)/(70s-110s)  **DVT PPX  Patient is on warfarin  **Dispo  PT recommends patient discharge to SNF    This is a Psychologist, occupational Note.  The care of the patient was discussed with Dr. Andrey Campanile and the assessment and plan formulated with their assistance.  Please see their attached note for official documentation of the daily encounter.   LOS: 4 days   Merrie Roof, MS3 01/14/2013, 2:44 PM

## 2013-01-14 NOTE — Progress Notes (Addendum)
  Date: 01/14/2013  Patient name: Ashley Rubio  Medical record number: [id number]  Date of birth: 1935-10-17   This patient has been seen and the plan of care was discussed with the house staff. Please see their note for complete details. I concur with their findings with the following additions/corrections:  Agree with plan as outlined by Dr. Andrey Campanile and medical student Jerolyn Shin. Ashley Rubio is having poor nutritional intake and not meeting her needs, will likely need enteric supplementation, such as tube feeds if she continues to have poor intake. In regards to her afib with RVR, we will start back on low dose metoprolol. Over the last 24hr, she was treated for hyperkalemia, likely iatrogenic due to supplementation of potassium in IVF. Will place safety portal query to minimize occurrence in future. She is on day #4 of 7 for HCAP and enterococcal UTI. Had discussion with family in regards to goals of care and pursuing SNF due to debility.  Judyann Munson, MD 01/14/2013, 5:50 PM

## 2013-01-14 NOTE — Progress Notes (Signed)
Patient's BP 172/79 HR 140's-160's. MD notified

## 2013-01-14 NOTE — Progress Notes (Addendum)
NUTRITION FOLLOW UP  Intervention:   1. Ensure Pudding po TID, each supplement provides 170 kcal and 4 grams of protein.    2. Calorie Count started, RD will follow up on 8/13 with results.   3. Given poor intake, pt remains at increased risk for refeeding syndrome. Recommend monitor Magnesium, Potassium, and Phosphorous.   Nutrition Dx:   Inadequate oral intake related to poor appetite as evidenced by pt and family report.  Goal:   Intake to meet >/=90% estimated nutrition needs.   Monitor:   PO intake, weight trends, labs  Assessment:   Pt continues to eat 0-25%. Calorie count started by MD. Pt is agreeable to trying an oral nutrition supplement at this time. Somewhat confused, stated she was at [facility].  If unable to meet nutrition needs with oral intake, recommend initiation of enteral nutrition to support intake.   RD will follow up with results of calorie count on 8/13.   Height: Ht Readings from Last 1 Encounters:  01/10/13 5\' 6"  (1.676 m)    Weight Status:   Wt Readings from Last 1 Encounters:  01/13/13 143 lb 3.2 oz (64.955 kg)    Re-estimated needs:  Kcal: 1550 - 1700  Protein: 65 - 75 grams  Fluid: 1.5 - 1.8 liters dailiy  Skin: Stage II L buttocks  Stage I sacrum  Skin tear L arm   Diet Order: Dysphagia 1, nectar thick liquids    Intake/Output Summary (Last 24 hours) at 01/14/13 1420 Last data filed at 01/14/13 1100  Gross per 24 hour  Intake   2000 ml  Output   1050 ml  Net    950 ml    Last BM: 8/11   Labs:   Recent Labs Lab 01/10/13 1524 01/10/13 2307 01/11/13 0520 01/12/13 0420  01/14/13 0233 01/14/13 0656 01/14/13 1020  NA 139  --  137 136  < > 139 139 140  K 3.4*  --  3.7 4.2  < > 4.7 4.7 4.5  CL 102  --  104 105  < > 109 110 109  CO2 23  --  22 21  < > 21 23 21   BUN 12  --  10 9  < > 7 7 7   CREATININE 0.68  --  0.55 0.51  < > 0.58 0.54 0.54  CALCIUM 8.5  --  8.0* 8.1*  < > 8.7 8.6 8.6  MG  --  1.2*  --  1.9  --   --    --   --   PHOS  --   --   --  2.4  --   --   --   --   GLUCOSE 82  --  77 81  < > 76 135* 90  < > = values in this interval not displayed.  CBG (last 3)  No results found for this basename: GLUCAP,  in the last 72 hours  Scheduled Meds: . antiseptic oral rinse  15 mL Mouth Rinse q12n4p  . aztreonam  2 g Intravenous Q8H  . budesonide-formoterol  2 puff Inhalation BID  . chlorhexidine  15 mL Mouth Rinse BID  . donepezil  5 mg Oral QHS  . haloperidol lactate  1 mg Intravenous Once  . metoprolol tartrate  12.5 mg Oral BID  . vancomycin  500 mg Intravenous Q12H  . warfarin  2.5 mg Oral ONCE-1800  . Warfarin - Pharmacist Dosing Inpatient   Does not apply q1800    Continuous Infusions: .  sodium chloride Stopped (01/14/13 0603)    Clarene Duke RD, LDN Pager [phone] After Hours pager [phone]

## 2013-01-14 NOTE — Progress Notes (Signed)
Pt heart rate sustained in the 170's and began to drop to the 140'-150's sustaining while at rest. Pt was asymptomatic. Bp 151/83 pt denied any chest pain or discomfort at the time. MD was called and notified along with Rapid Response nurse who came by to view the patient. New orders were received from the MD and carried out. Will cont to monitor.

## 2013-01-14 NOTE — Progress Notes (Signed)
Speech Language Pathology Dysphagia Treatment Patient Details Name: Ashley Rubio MRN: [id number] DOB: Jul 05, 1935 Today's Date: 01/14/2013 Time: 1616-     Assessment / Plan / Recommendation Clinical Impression  Dentures placed and pt, took piece of ice chip, with delayed congested cough after swallow.  Same result with Nectar thick liquid, and with small bite of egg salad sandwich.  Pt. is currently confused, but cooperating relatively well.  Appears to be hallucinating, talking about "that pretty cat" and reaching for things in the air.  An objective swallow evaluation is indicated to ensure the best diet recommendations and to reduce aspiration risks, however, it is uncertain if pt. will cooperate.  Pt.'s daughter from [place], Ashley Rubio, is planning to be here tomorrow, and will come to Radiology to assist, as her mother seems to take po's better from her.    Diet Recommendation  Continue with Current Diet: Nectar-thick liquid;Dysphagia 1 (puree)    SLP Plan Continue with current plan of care   Pertinent Vitals/Pain n/a   Swallowing Goals  SLP Swallowing Goals Patient will consume recommended diet without observed clinical signs of aspiration with: Supervision/safety;Moderate assistance Swallow Study Goal #1 - Progress: Partly Met Patient will utilize recommended strategies during swallow to increase swallowing safety with: Moderate assistance Swallow Study Goal #2 - Progress: Partly met  General Temperature Spikes Noted: No Respiratory Status: Room air Behavior/Cognition: Alert;Cooperative;Requires cueing;Confused Oral Cavity - Dentition: Dentures, top;Dentures, bottom Patient Positioning: Upright in bed  Oral Cavity - Oral Hygiene Does patient have any of the following "at risk" factors?: Lips - dry, cracked Brush patient's teeth BID with toothbrush (using toothpaste with fluoride): Yes Patient is HIGH RISK - Oral Care Protocol followed (see row info): Yes Patient is AT RISK -  Oral Care Protocol followed (see row info): Yes   Dysphagia Treatment Treatment focused on: Skilled observation of diet tolerance;Upgraded PO texture trials;Patient/family/caregiver education Family/Caregiver Educated: Daughter Ashley Rubio Treatment Methods/Modalities: Skilled observation Patient observed directly with PO's: Yes Type of PO's observed: Ice chips;Nectar-thick liquids;Dysphagia 2 (chopped) Feeding: Total assist Liquids provided via: Teaspoon;Cup Oral Phase Signs & Symptoms: Prolonged bolus formation;Prolonged mastication;Prolonged oral phase Pharyngeal Phase Signs & Symptoms: Suspected delayed swallow initiation;Delayed cough Type of cueing: Verbal;Tactile Amount of cueing: Moderate   GO     Maryjo Rochester T 01/14/2013, 4:59 PM

## 2013-01-14 NOTE — Progress Notes (Signed)
Pt's BP 120/90 HR 150"s.  MD notified.

## 2013-01-15 ENCOUNTER — Inpatient Hospital Stay (HOSPITAL_COMMUNITY): Payer: Medicare Other

## 2013-01-15 DIAGNOSIS — J810 Acute pulmonary edema: Secondary | ICD-10-CM

## 2013-01-15 LAB — BASIC METABOLIC PANEL
BUN: 8 mg/dL (ref 6–23)
CO2: 22 mEq/L (ref 19–32)
CO2: 24 mEq/L (ref 19–32)
CO2: 23 mEq/L (ref 19–32)
CO2: 22 mEq/L (ref 19–32)
Calcium: 8.5 mg/dL (ref 8.4–10.5)
Calcium: 8.8 mg/dL (ref 8.4–10.5)
Calcium: 8.5 mg/dL (ref 8.4–10.5)
Chloride: 108 mEq/L (ref 96–112)
Chloride: 109 mEq/L (ref 96–112)
Chloride: 105 mEq/L (ref 96–112)
Creatinine, Ser: 0.59 mg/dL (ref 0.50–1.10)
Creatinine, Ser: 0.59 mg/dL (ref 0.50–1.10)
Creatinine, Ser: 0.58 mg/dL (ref 0.50–1.10)
GFR calc Af Amer: >90 mL/min (ref >90)
GFR calc non Af Amer: 84 mL/min — ABNORMAL LOW (ref >90)
Glucose, Bld: 112 mg/dL — ABNORMAL HIGH (ref 70–99)
Potassium: 4.1 mEq/L (ref 3.5–5.1)
Potassium: 4.3 mEq/L (ref 3.5–5.1)
Potassium: 4.2 mEq/L (ref 3.5–5.1)
Sodium: 138 mEq/L (ref 135–145)
Sodium: 140 mEq/L (ref 135–145)
Sodium: 138 mEq/L (ref 135–145)
Sodium: 136 mEq/L (ref 135–145)

## 2013-01-15 LAB — CBC WITH DIFFERENTIAL/PLATELET
Basophils Absolute: 0 10*3/uL (ref 0.0–0.1)
HCT: 37.5 % (ref 36.0–46.0)
Lymphocytes Relative: 18 % (ref 12–46)
Monocytes Absolute: 0.4 10*3/uL (ref 0.1–1.0)
Neutro Abs: 5 10*3/uL (ref 1.7–7.7)
RDW: 14.1 % (ref 11.5–15.5)
WBC: 6.8 10*3/uL (ref 4.0–10.5)

## 2013-01-15 LAB — PROTIME-INR: Prothrombin Time: 23.5 seconds — ABNORMAL HIGH (ref 11.6–15.2)

## 2013-01-15 LAB — MAGNESIUM: Magnesium: 1.4 mg/dL — ABNORMAL LOW (ref 1.5–2.5)

## 2013-01-15 LAB — VANCOMYCIN, TROUGH: Vancomycin Tr: 13.1 ug/mL (ref 10.0–20.0)

## 2013-01-15 LAB — PHOSPHORUS: Phosphorus: 2.8 mg/dL (ref 2.3–4.6)

## 2013-01-15 MED ORDER — METOPROLOL TARTRATE 1 MG/ML IV SOLN
5.0000 mg | Freq: Once | INTRAVENOUS | Status: AC
  Administered 2013-01-15: 5 mg via INTRAVENOUS
  Filled 2013-01-15: qty 5

## 2013-01-15 MED ORDER — METOPROLOL TARTRATE 1 MG/ML IV SOLN
5.0000 mg | Freq: Four times a day (QID) | INTRAVENOUS | Status: DC
  Administered 2013-01-16 – 2013-01-19 (×16): 5 mg via INTRAVENOUS
  Filled 2013-01-15 (×19): qty 5

## 2013-01-15 MED ORDER — MAGNESIUM SULFATE 40 MG/ML IJ SOLN
2.0000 g | Freq: Once | INTRAMUSCULAR | Status: AC
  Administered 2013-01-15: 2 g via INTRAVENOUS
  Filled 2013-01-15: qty 50

## 2013-01-15 MED ORDER — WARFARIN SODIUM 3 MG PO TABS
3.0000 mg | ORAL_TABLET | Freq: Once | ORAL | Status: DC
  Filled 2013-01-15: qty 1

## 2013-01-15 MED ORDER — FUROSEMIDE 10 MG/ML IJ SOLN
20.0000 mg | Freq: Once | INTRAMUSCULAR | Status: AC
  Administered 2013-01-15: 20 mg via INTRAVENOUS
  Filled 2013-01-15: qty 2

## 2013-01-15 MED ORDER — VANCOMYCIN HCL IN DEXTROSE 750-5 MG/150ML-% IV SOLN
750.0000 mg | Freq: Two times a day (BID) | INTRAVENOUS | Status: DC
  Administered 2013-01-15 – 2013-01-18 (×7): 750 mg via INTRAVENOUS
  Filled 2013-01-15 (×8): qty 150

## 2013-01-15 NOTE — Progress Notes (Signed)
Discuss with MD on rounds about patient po lopressor requesting a change to IV due to possible aspiration.

## 2013-01-15 NOTE — Progress Notes (Signed)
Patient ID: Ashley Rubio, female   DOB: 1935-12-17, 77 y.o.   MRN: [id number]   Subjective: Yesterday, telemetry data was reviewed and the patient had no episodes of bradycardia. In general, her HR ranged between 100-150 since being placed on telemetry with 79 alarms in total (almost entirely for HR >155). No acute events overnight. This morning Ashley Rubio was somnolent and said that she was very tired. She would awaken when asked questions, only to fall back asleep, sometimes answering. Her daughter was with the patient this morning and had spent the night at bedside. Per her daughter, Ashley Rubio ate an entire ensure pudding yesterday, slept through the night albeit fitfully, and ate one bite of her breakfast this morning. We discussed the nutritional analysis with the patient's daughter and discussed the options of using a NG feeding tube or PEG tube and emphasized that the decision should be made as a family and can be challenging.  Objective: Vital signs in last 24 hours: Filed Vitals:   01/15/13 0549 01/15/13 0922 01/15/13 0923 01/15/13 1258  BP: 155/94  156/96 147/91  Pulse: 82  62 50  Temp: 97.8 F (36.6 C)  97.5 F (36.4 C) 97.7 F (36.5 C)  TempSrc: Axillary     Resp: 16  18 16   Height:      Weight:      SpO2: 97% 97% 98% 95%   Weight change: 2.54 kg (5 lb 9.6 oz)  Intake/Output Summary (Last 24 hours) at 01/15/13 1445 Last data filed at 01/15/13 1259  Gross per 24 hour  Intake     10 ml  Output      0 ml  Net     10 ml   Physical Exam  Vitals reviewed. Constitutional: She appears lethargic and malnourished. She appears unhealthy. No distress.  HENT:  Head: Normocephalic and atraumatic.  Phlegm suctioned from oral cavity  Eyes: Pupils are equal, round, and reactive to light.  Cardiovascular: Normal rate.  An irregularly irregular rhythm present.  Pulmonary/Chest: She has rhonchi.  Decreased effort of breathing for exam. We were only able to auscultate the anterior  chest.  Abdominal: Soft. Bowel sounds are normal.  Musculoskeletal:       Right shoulder: She exhibits decreased strength (left grip).  Neurological: She appears lethargic.    Lab Results: CBC    Component Value Date/Time   WBC 6.8 01/15/2013 0821   RBC 4.12 01/15/2013 0821   HGB 12.5 01/15/2013 0821   HCT 37.5 01/15/2013 0821   PLT 253 01/15/2013 0821   MCV 91.0 01/15/2013 0821   MCH 30.3 01/15/2013 0821   MCHC 33.3 01/15/2013 0821   RDW 14.1 01/15/2013 0821   LYMPHSABS 1.2 01/15/2013 0821   MONOABS 0.4 01/15/2013 0821   EOSABS 0.2 01/15/2013 0821   BASOSABS 0.0 01/15/2013 0821    CMP     Component Value Date/Time   NA 138 01/15/2013 0821   K 4.2 01/15/2013 0821   CL 108 01/15/2013 0821   CO2 23 01/15/2013 0821   GLUCOSE 100* 01/15/2013 0821   BUN 8 01/15/2013 0821   CREATININE 0.66 01/15/2013 0821   CALCIUM 8.5 01/15/2013 0821   PROT 5.9* 01/10/2013 1524   ALBUMIN 2.3* 01/10/2013 1524   AST 18 01/10/2013 1524   ALT 11 01/10/2013 1524   ALKPHOS 96 01/10/2013 1524   BILITOT 0.9 01/10/2013 1524   GFRNONAA 84* 01/15/2013 0821   GFRAA >90 01/15/2013 0821      Component Value Date/Time  INR 2.17* 01/15/2013 0615     Micro Results: Recent Results (from the past 240 hour(s))  URINE CULTURE     Status: None   Collection Time    01/06/13  2:13 PM      Result Value Range Status   Specimen Description URINE, CATHETERIZED   Final   Special Requests NONE   Final   Culture  Setup Time     Final   Value: 01/06/2013 21:58     Performed at [facility] Count     Final   Value: >=100,000 COLONIES/ML     Performed at [facility]   Culture     Final   Value: YEAST     Performed at [facility]   Report Status 01/07/2013 FINAL   Final  URINE CULTURE     Status: None   Collection Time    01/10/13  5:20 PM      Result Value Range Status   Specimen Description URINE, CATHETERIZED   Final   Special Requests NONE   Final   Culture  Setup Time     Final   Value:  01/10/2013 19:10     Performed at [facility] Count     Final   Value: 75,000 COLONIES/ML     Performed at [facility]   Culture     Final   Value: ENTEROCOCCUS SPECIES     Performed at [facility]   Report Status 01/12/2013 FINAL   Final   Organism ID, Bacteria ENTEROCOCCUS SPECIES   Final  CULTURE, BLOOD (ROUTINE X 2)     Status: None   Collection Time    01/10/13  5:30 PM      Result Value Range Status   Specimen Description BLOOD ARM LEFT   Final   Special Requests BOTTLES DRAWN AEROBIC AND ANAEROBIC 10CC   Final   Culture  Setup Time     Final   Value: 01/10/2013 23:26     Performed at [facility]   Culture     Final   Value:        BLOOD CULTURE RECEIVED NO GROWTH TO DATE CULTURE WILL BE HELD FOR 5 DAYS BEFORE ISSUING A FINAL NEGATIVE REPORT     Performed at [facility]   Report Status PENDING   Incomplete  CULTURE, BLOOD (ROUTINE X 2)     Status: None   Collection Time    01/10/13  5:39 PM      Result Value Range Status   Specimen Description BLOOD HAND LEFT   Final   Special Requests BOTTLES DRAWN AEROBIC ONLY 8CC   Final   Culture  Setup Time     Final   Value: 01/10/2013 23:26     Performed at [facility]   Culture     Final   Value:        BLOOD CULTURE RECEIVED NO GROWTH TO DATE CULTURE WILL BE HELD FOR 5 DAYS BEFORE ISSUING A FINAL NEGATIVE REPORT     Performed at [facility]   Report Status PENDING   Incomplete   Studies/Results: Dg Chest 2 View  01/15/2013   *RADIOLOGY REPORT*  Clinical Data: Cough, coarse breath sounds.  Somnolence. Fatigue.  CHEST - 2 VIEW  Comparison: 01/10/2013  Findings: Heart is enlarged.  There is dense opacity at the left lung base, obscuring the left hemidiaphragm.  Patchy density at  the right lung base has increased, consistent with infiltrate. Bilateral pleural effusions are noted.  There are interstitial changes of pulmonary edema.  IMPRESSION:  1.  Cardiomegaly  and pulmonary edema. 2.  Bilateral lower lobe infiltrates, left greater than right. 3.  Bilateral small effusions.   Original Report Authenticated By: Norva Pavlov, M.D.   Dg Swallowing Func-speech Pathology  01/15/2013   Lenor Derrick, CCC-SLP     01/15/2013 12:34 PM Objective Swallowing Evaluation: Modified Barium Swallowing Study   Patient Details  Name: Anoushka Divito MRN: [id number] Date of Birth: March 25, 1936  Today's Date: 01/15/2013 Time: 1100-1234 SLP Time Calculation (min): 94 min  Past Medical History:  Past Medical History  Diagnosis Date   Hypertension    Unspecified venous (peripheral) insufficiency    Personal history of malignant neoplasm of large intestine    Cancer     colon   Varicose veins of lower extremities with other complications    Past Surgical History:  Past Surgical History  Procedure Laterality Date   Neck surgery  2003   Tongue biopsy  2003   Abdominal hysterectomy     Breast biopsy     Colonoscopy     Gastric perforation repair      Cardioversion     Leg surgery Left    HPI:  Mrs. Zachow was recently discharged from our hospital for  encephalopathy due to presumed UTI that worsened her underlying  dementia. She was reported to becoming increasingly weak at home  with poor po intake. She was readmitted for dehydration, HCAP due  to LLL infiltrate.     Assessment / Plan / Recommendation Clinical Impression  Dysphagia Diagnosis: Moderate oral phase dysphagia;Severe oral  phase dysphagia Clinical impression: Pt. exhibits a moderate oral phase dysphagia  and severe pharyngeal dysphagia, characterized by weak lingual  movements, resulting in delayed oral transit and poor posterier  propulsion of the bolus orally and pharyngeally; decreased  laryngeal elevation and hyoid excursion, and epiglottic  deflection during the swallow, resulting in penetration of both  nectar and honey consistencies;  and decreased base of tongue  contraction to the posterior pharyngeal wall and  decreased  pharyngeal perestalsis, resulting in vallucular residue with  puree, with risk of aspiration after the swallow.  Pt. was unable  to f/c's for compensatory strategies, but finally was able to  complete a dry swallow which decreased the laryngeal stasis.   This required max verbal and tactile cues, and would not be  possible to complete after every swallow.  Pt. is at risk for  aspiration with any/all possible consistencies at this time.    Treatment Recommendation  Defer treatment plan to SLP at (Comment) (Defer plan until GOC  determined)    Diet Recommendation NPO (Until MD talks with family)        Other  Recommendations     Follow Up Recommendations  Skilled Nursing facility;24 hour supervision/assistance    Frequency and Duration        Pertinent Vitals/Pain n/a    SLP Swallow Goals     General Date of Onset: 01/04/13 HPI: Mrs. Siharath was recently discharged from our hospital for  encephalopathy due to presumed UTI that worsened her underlying  dementia. She was reported to becoming increasingly weak at home  with poor po intake. She was readmitted for dehydration, HCAP due  to LLL infiltrate. Type of Study: Modified Barium Swallowing Study Reason for Referral: Objectively evaluate swallowing function Previous Swallow Assessment: BSE 01/06/13 Diet Prior  to this Study: Dysphagia 1 (puree);Nectar-thick  liquids Temperature Spikes Noted: No Respiratory Status: Room air History of Recent Intubation: No Behavior/Cognition: Alert;Distractible;Requires cueing;Doesn't  follow directions;Decreased sustained  attention;Impulsive;Confused Oral Cavity - Dentition: Dentures, top;Dentures, bottom Oral Motor / Sensory Function: Within functional limits Self-Feeding Abilities: Total assist Patient Positioning: Upright in chair Baseline Vocal Quality: Clear Volitional Cough: Congested Volitional Swallow: Unable to elicit Anatomy: Within functional limits Pharyngeal Secretions: Not observed secondary MBS    Reason for  Referral Objectively evaluate swallowing function   Oral Phase Oral Preparation/Oral Phase Oral Phase: Impaired Oral - Honey Oral - Honey Teaspoon: Right anterior bolus loss;Weak lingual  manipulation;Incomplete tongue to palate contact;Reduced  posterior propulsion;Piecemeal swallowing;Delayed oral  transit;Left anterior bolus loss Oral - Nectar Oral - Nectar Teaspoon: Left anterior bolus loss;Right anterior  bolus loss;Weak lingual manipulation;Incomplete tongue to palate  contact;Piecemeal swallowing;Delayed oral transit Oral - Nectar Cup: Left anterior bolus loss;Right anterior bolus  loss;Weak lingual manipulation;Incomplete tongue to palate  contact;Piecemeal swallowing;Delayed oral transit Oral - Solids Oral - Puree: Left anterior bolus loss;Right anterior bolus  loss;Weak lingual manipulation;Incomplete tongue to palate  contact;Lingual/palatal residue;Piecemeal swallowing;Delayed oral  transit   Pharyngeal Phase Pharyngeal Phase Pharyngeal Phase: Impaired Pharyngeal - Honey Pharyngeal - Honey Teaspoon: Reduced pharyngeal  peristalsis;Reduced epiglottic inversion;Reduced anterior  laryngeal mobility;Reduced laryngeal elevation;Reduced tongue  base retraction;Penetration/Aspiration after swallow;Compensatory  strategies attempted (Comment);Pharyngeal residue - posterior  pharnyx (Pt. unable to f/c's for compensatory strategies.) Penetration/Aspiration details (honey teaspoon): Material enters  airway, remains ABOVE vocal cords then ejected out Pharyngeal - Nectar Pharyngeal - Nectar Cup: Reduced epiglottic inversion;Reduced  anterior laryngeal mobility;Reduced laryngeal elevation;Reduced  tongue base retraction;Penetration/Aspiration during  swallow;Pharyngeal residue - valleculae Penetration/Aspiration details (nectar cup): Material enters  airway, CONTACTS cords and not ejected out Pharyngeal - Solids Pharyngeal - Puree: Delayed swallow initiation;Premature spillage  to valleculae;Reduced pharyngeal  peristalsis;Reduced epiglottic  inversion;Reduced anterior laryngeal mobility;Reduced laryngeal  elevation;Reduced tongue base retraction;Pharyngeal residue -  valleculae (Double swallow difficult to obtain, but reduced  residue.)  Cervical Esophageal Phase    GO    Cervical Esophageal Phase Cervical Esophageal Phase: Vicente Masson T 01/15/2013, 12:34 PM    Medications: I have reviewed the patient's current medications. Scheduled Meds:  antiseptic oral rinse  15 mL Mouth Rinse q12n4p   aztreonam  2 g Intravenous Q8H   budesonide-formoterol  2 puff Inhalation BID   chlorhexidine  15 mL Mouth Rinse BID   donepezil  5 mg Oral QHS   feeding supplement  1 Container Oral TID BM   haloperidol lactate  1 mg Intravenous Once   magnesium sulfate 1 - 4 g bolus IVPB  2 g Intravenous Once   vancomycin  750 mg Intravenous Q12H   warfarin  3 mg Oral ONCE-1800   Warfarin - Pharmacist Dosing Inpatient   Does not apply q1800   Continuous Infusions:  sodium chloride Stopped (01/14/13 0603)   PRN Meds:.RESOURCE THICKENUP CLEAR Assessment/Plan: Principal Problem:   HCAP (healthcare-associated pneumonia) Active Problems:   Atrial fibrillation   Hypertension   Urinary tract infection  Ms. Dahlia Nifong is a 77 year old lady with a PMHx of atrial fibrillation, hypertension, and dementia who presents with generalized weakness and left lower lobe HCAP.   **HCAP  She has coarse breath sounds and a cough on exam. CXR today shows pulmonary edema, bilateral lower lobe infiltrates with left greater than right, and small bilateral effusions. She failed her barium swallow study  today. Etiology of pulmonary infiltrates could be pulmonary edema due to atrial fibrillation, IV fluids, or possibly aspiration given her failed swallow study and recent consumption of ensure pudding. Her current antibiotic regimen should provide coverage if aspiration is the case. - NPO - Continue aztreonam and  vancomycin IV for HCAP (anaphylaxis to penicillin)  - Lasix 20 mg IV **Decreased Appetite/Malnourishment  If she is to be eventually discharged to SNF or home care we need an objective goal for her nutrition.  -Nutrition consulted and recommend the following with the goal of intake to meet >/=90% estimated nutrition needs:  1. Ensure Pudding po TID, each supplement provides 170 kcal and 4 grams of protein.  2. Calorie Count started, RD will follow up on 8/13 with results.  3. Given poor intake, pt remains at increased risk for refeeding syndrome. Recommend monitor Magnesium, Potassium, and Phosphorous.  -NPO for now in light of failed barium swallow study -Monitor weight trends, labs  -Discuss the options of NG feeding tube and PEG with family today. **Weakness  Likely multifactorial as patient is deconditioned from multiple recent hospital stays, has infection (HCAP) and meets criteria for severe malnutrition. No signs of stroke on prior CT or focal neurologic deficits on admission. PT noted decreased use of left upper extremity today and SaO2 87% on room air upon returning to supine so applied 2L O2 [place] and notified RN. Weakness on left side is concerning for possible neurovascular event. - Treating HCAP as above  - Will continue to monitor electrolytes and replete as necessary  - Continue PT - MRI of brain to look for stroke/TIA **Altered Mental Status  Stable since discharge. She has been alert to person only. She probably has undergone a worsening of her underlying dementia and will not return to her baseline of living independently, and family is aware of this. Subacute stroke is important to consider given history of stepwise decline, especially with the suggestion of left atrial filling defect on CT chest.  - Resumed donepezil  - Haldol prn for agitation  **Atrial fibrillation/Tachycardia/Bradycardia  Patient is on warfarin. INR 2.17 today. Possible small left atrial filling defect on  CT. She has had episodes of tachycardia and bradycardia. HR bradycardic to 45 on Friday 8/8.  - Warfarin per pharmacy.  - Metoprolol IV prn for rate control **HTN  BP ranging (140s-150s)/(90s)  -Holding lisinopril **Iatrogenic Hyperkalemia  Resolved. -Monitor electrolytes and treat hyperkalemia if present  -Continue to hold lisinopril  **DVT PPX  Patient is on warfarin  **Dispo  PT recommends eventual patient discharge to SNF   This is a Psychologist, occupational Note.  The care of the patient was discussed with Dr. Andrey Campanile and the assessment and plan formulated with their assistance.  Please see their attached note for official documentation of the daily encounter.   LOS: 5 days   Merrie Roof, MS3 01/15/2013, 2:45 PM

## 2013-01-15 NOTE — Procedures (Signed)
Objective Swallowing Evaluation: Modified Barium Swallowing Study  Patient Details  Name: Ashley Rubio MRN: [id number] Date of Birth: 10/10/1935  Today's Date: 01/15/2013 Time: 4696-2952 SLP Time Calculation (min): 94 min  Past Medical History:  Past Medical History  Diagnosis Date  . Hypertension   . Unspecified venous (peripheral) insufficiency   . Personal history of malignant neoplasm of large intestine   . Cancer     colon  . Varicose veins of lower extremities with other complications    Past Surgical History:  Past Surgical History  Procedure Laterality Date  . Neck surgery  2003  . Tongue biopsy  2003  . Abdominal hysterectomy    . Breast biopsy    . Colonoscopy    . Gastric perforation repair     . Cardioversion    . Leg surgery Left    HPI:  Ashley Rubio was recently discharged from our hospital for encephalopathy due to presumed UTI that worsened her underlying dementia. She was reported to becoming increasingly weak at home with poor po intake. She was readmitted for dehydration, HCAP due to LLL infiltrate.     Assessment / Plan / Recommendation Clinical Impression  Dysphagia Diagnosis: Moderate oral phase dysphagia;Severe oral phase dysphagia Clinical impression: Pt. exhibits a moderate oral phase dysphagia and severe pharyngeal dysphagia, characterized by weak lingual movements, resulting in delayed oral transit and poor posterier propulsion of the bolus orally and pharyngeally; decreased laryngeal elevation and hyoid excursion, and epiglottic deflection during the swallow, resulting in penetration of both nectar and honey consistencies;  and decreased base of tongue contraction to the posterior pharyngeal wall and decreased pharyngeal perestalsis, resulting in vallucular residue with puree, with risk of aspiration after the swallow.  Pt. was unable to f/c's for compensatory strategies, but finally was able to complete a dry swallow which decreased the laryngeal  stasis.  This required max verbal and tactile cues, and would not be possible to complete after every swallow.  Pt. is at risk for aspiration with any/all possible consistencies at this time.    Treatment Recommendation  Defer treatment plan to SLP at (Comment) (Defer plan until GOC determined)    Diet Recommendation NPO (Until MD talks with family)        Other  Recommendations     Follow Up Recommendations  Skilled Nursing facility;24 hour supervision/assistance    Frequency and Duration        Pertinent Vitals/Pain n/a    SLP Swallow Goals     General Date of Onset: 01/04/13 HPI: Ashley Rubio was recently discharged from our hospital for encephalopathy due to presumed UTI that worsened her underlying dementia. She was reported to becoming increasingly weak at home with poor po intake. She was readmitted for dehydration, HCAP due to LLL infiltrate. Type of Study: Modified Barium Swallowing Study Reason for Referral: Objectively evaluate swallowing function Previous Swallow Assessment: BSE 01/06/13 Diet Prior to this Study: Dysphagia 1 (puree);Nectar-thick liquids Temperature Spikes Noted: No Respiratory Status: Room air History of Recent Intubation: No Behavior/Cognition: Alert;Distractible;Requires cueing;Doesn't follow directions;Decreased sustained attention;Impulsive;Confused Oral Cavity - Dentition: Dentures, top;Dentures, bottom Oral Motor / Sensory Function: Within functional limits Self-Feeding Abilities: Total assist Patient Positioning: Upright in chair Baseline Vocal Quality: Clear Volitional Cough: Congested Volitional Swallow: Unable to elicit Anatomy: Within functional limits Pharyngeal Secretions: Not observed secondary MBS    Reason for Referral Objectively evaluate swallowing function   Oral Phase Oral Preparation/Oral Phase Oral Phase: Impaired Oral - Honey Oral - Honey Teaspoon: Right anterior  bolus loss;Weak lingual manipulation;Incomplete tongue to  palate contact;Reduced posterior propulsion;Piecemeal swallowing;Delayed oral transit;Left anterior bolus loss Oral - Nectar Oral - Nectar Teaspoon: Left anterior bolus loss;Right anterior bolus loss;Weak lingual manipulation;Incomplete tongue to palate contact;Piecemeal swallowing;Delayed oral transit Oral - Nectar Cup: Left anterior bolus loss;Right anterior bolus loss;Weak lingual manipulation;Incomplete tongue to palate contact;Piecemeal swallowing;Delayed oral transit Oral - Solids Oral - Puree: Left anterior bolus loss;Right anterior bolus loss;Weak lingual manipulation;Incomplete tongue to palate contact;Lingual/palatal residue;Piecemeal swallowing;Delayed oral transit   Pharyngeal Phase Pharyngeal Phase Pharyngeal Phase: Impaired Pharyngeal - Honey Pharyngeal - Honey Teaspoon: Reduced pharyngeal peristalsis;Reduced epiglottic inversion;Reduced anterior laryngeal mobility;Reduced laryngeal elevation;Reduced tongue base retraction;Penetration/Aspiration after swallow;Compensatory strategies attempted (Comment);Pharyngeal residue - posterior pharnyx (Pt. unable to f/c's for compensatory strategies.) Penetration/Aspiration details (honey teaspoon): Material enters airway, remains ABOVE vocal cords then ejected out Pharyngeal - Nectar Pharyngeal - Nectar Cup: Reduced epiglottic inversion;Reduced anterior laryngeal mobility;Reduced laryngeal elevation;Reduced tongue base retraction;Penetration/Aspiration during swallow;Pharyngeal residue - valleculae Penetration/Aspiration details (nectar cup): Material enters airway, CONTACTS cords and not ejected out Pharyngeal - Solids Pharyngeal - Puree: Delayed swallow initiation;Premature spillage to valleculae;Reduced pharyngeal peristalsis;Reduced epiglottic inversion;Reduced anterior laryngeal mobility;Reduced laryngeal elevation;Reduced tongue base retraction;Pharyngeal residue - valleculae (Double swallow difficult to obtain, but reduced residue.)   Cervical Esophageal Phase    GO    Cervical Esophageal Phase Cervical Esophageal Phase: Vicente Masson T 01/15/2013, 12:34 PM

## 2013-01-15 NOTE — Progress Notes (Signed)
Call placed to MD to inquiry about dose of coumadin po.

## 2013-01-15 NOTE — Progress Notes (Signed)
Spoke with patient's son via phone who states they plan to talk tonight and follow up with me to further discuss d/c plans- full assessment to follow     Reece Levy, MSW [phone]

## 2013-01-15 NOTE — Progress Notes (Signed)
Subjective: [name] was seen and examined by me this AM.  She was much more drowsy than on prior mornings.  Her daughter says she slept through the night but her sleep was not restful and she was talking in her sleep at times.  She opened her eyes slightly when her name was called but closed them quickly.    Objective: Vital signs in last 24 hours: Filed Vitals:   01/15/13 0922 01/15/13 0923 01/15/13 1258 01/15/13 1745  BP:  156/96 147/91 154/118  Pulse:  62 50 108  Temp:  97.5 F (36.4 C) 97.7 F (36.5 C) 97.5 F (36.4 C)  TempSrc:      Resp:  18 16 16   Height:      Weight:      SpO2: 97% 98% 95% 92%   Weight change: 2.54 kg (5 lb 9.6 oz)  Intake/Output Summary (Last 24 hours) at 01/15/13 1835 Last data filed at 01/15/13 1805  Gross per 24 hour  Intake     10 ml  Output      0 ml  Net     10 ml   General: asleep in bed, somnolent but arousable HEENT: PERRL, increased oral secretions Cardiac: irregular rhythm Pulm: poor inspiratory effort, audible cough and congestion,  Abd: soft, nontender, nondistended, BS present Ext: warm and well perfused, no pedal edema Neuro: not as responsive as yesterday, decreased strength of LUE  Lab Results: Basic Metabolic Panel:  Recent Labs Lab 01/12/13 0420  01/15/13 0615 01/15/13 0821  NA 136  < > 140 138  K 4.2  < > 4.3 4.2  CL 105  < > 109 108  CO2 21  < > 24 23  GLUCOSE 81  < > 101* 100*  BUN 9  < > 8 8  CREATININE 0.51  < > 0.67 0.66  CALCIUM 8.1*  < > 8.8 8.5  MG 1.9  --  1.4*  --   PHOS 2.4  --  2.8  --   < > = values in this interval not displayed. Liver Function Tests:  Recent Labs Lab 01/10/13 1524  AST 18  ALT 11  ALKPHOS 96  BILITOT 0.9  PROT 5.9*  ALBUMIN 2.3*   CBC:  Recent Labs Lab 01/14/13 0656 01/15/13 0821  WBC 5.8 6.8  NEUTROABS 4.3 5.0  HGB 12.2 12.5  HCT 36.5 37.5  MCV 90.3 91.0  PLT 237 253   Cardiac Enzymes:  Recent Labs Lab 01/10/13 1524  TROPONINI <0.30    Coagulation:  Recent Labs Lab 01/13/13 0400 01/14/13 0656 01/14/13 1020 01/15/13 0615  LABPROT 28.3* 26.6* 24.7* 23.5*  INR 2.77* 2.55* 2.32* 2.17*   Urinalysis:  Recent Labs Lab 01/10/13 1720  COLORURINE AMBER*  LABSPEC 1.017  PHURINE 5.5  GLUCOSEU NEGATIVE  HGBUR SMALL*  BILIRUBINUR LARGE*  KETONESUR 40*  PROTEINUR 30*  UROBILINOGEN 1.0  NITRITE NEGATIVE  LEUKOCYTESUR MODERATE*    Micro Results: Recent Results (from the past 240 hour(s))  URINE CULTURE     Status: None   Collection Time    01/06/13  2:13 PM      Result Value Range Status   Specimen Description URINE, CATHETERIZED   Final   Special Requests NONE   Final   Culture  Setup Time     Final   Value: 01/06/2013 21:58     Performed at [facility] Count     Final   Value: >=100,000 COLONIES/ML  Performed at [facility]     Final   Value: YEAST     Performed at [facility]   Report Status 01/07/2013 FINAL   Final  URINE CULTURE     Status: None   Collection Time    01/10/13  5:20 PM      Result Value Range Status   Specimen Description URINE, CATHETERIZED   Final   Special Requests NONE   Final   Culture  Setup Time     Final   Value: 01/10/2013 19:10     Performed at [facility] Count     Final   Value: 75,000 COLONIES/ML     Performed at [facility]   Culture     Final   Value: ENTEROCOCCUS SPECIES     Performed at [facility]   Report Status 01/12/2013 FINAL   Final   Organism ID, Bacteria ENTEROCOCCUS SPECIES   Final  CULTURE, BLOOD (ROUTINE X 2)     Status: None   Collection Time    01/10/13  5:30 PM      Result Value Range Status   Specimen Description BLOOD ARM LEFT   Final   Special Requests BOTTLES DRAWN AEROBIC AND ANAEROBIC 10CC   Final   Culture  Setup Time     Final   Value: 01/10/2013 23:26     Performed at [facility]   Culture     Final   Value:        BLOOD CULTURE  RECEIVED NO GROWTH TO DATE CULTURE WILL BE HELD FOR 5 DAYS BEFORE ISSUING A FINAL NEGATIVE REPORT     Performed at [facility]   Report Status PENDING   Incomplete  CULTURE, BLOOD (ROUTINE X 2)     Status: None   Collection Time    01/10/13  5:39 PM      Result Value Range Status   Specimen Description BLOOD HAND LEFT   Final   Special Requests BOTTLES DRAWN AEROBIC ONLY 8CC   Final   Culture  Setup Time     Final   Value: 01/10/2013 23:26     Performed at [facility]   Culture     Final   Value:        BLOOD CULTURE RECEIVED NO GROWTH TO DATE CULTURE WILL BE HELD FOR 5 DAYS BEFORE ISSUING A FINAL NEGATIVE REPORT     Performed at [facility]   Report Status PENDING   Incomplete   Studies/Results: Dg Chest 2 View  01/15/2013   *RADIOLOGY REPORT*  Clinical Data: Cough, coarse breath sounds.  Somnolence. Fatigue.  CHEST - 2 VIEW  Comparison: 01/10/2013  Findings: Heart is enlarged.  There is dense opacity at the left lung base, obscuring the left hemidiaphragm.  Patchy density at the right lung base has increased, consistent with infiltrate. Bilateral pleural effusions are noted.  There are interstitial changes of pulmonary edema.  IMPRESSION:  1.  Cardiomegaly and pulmonary edema. 2.  Bilateral lower lobe infiltrates, left greater than right. 3.  Bilateral small effusions.   Original Report Authenticated By: Norva Pavlov, M.D.   Dg Swallowing Func-speech Pathology  01/15/2013   Lenor Derrick, CCC-SLP     01/15/2013 12:34 PM Objective Swallowing Evaluation: Modified Barium Swallowing Study   Patient Details  Name: Ashley Rubio MRN: [id number] Date of Birth: 06/08/1935  Today's Date: 01/15/2013 Time: 4098-1191 SLP Time Calculation (  min): 94 min  Past Medical History:  Past Medical History  Diagnosis Date  . Hypertension   . Unspecified venous (peripheral) insufficiency   . Personal history of malignant neoplasm of large intestine   . Cancer     colon  . Varicose veins of  lower extremities with other complications    Past Surgical History:  Past Surgical History  Procedure Laterality Date  . Neck surgery  2003  . Tongue biopsy  2003  . Abdominal hysterectomy    . Breast biopsy    . Colonoscopy    . Gastric perforation repair     . Cardioversion    . Leg surgery Left    HPI:  Ashley Rubio was recently discharged from our hospital for  encephalopathy due to presumed UTI that worsened her underlying  dementia. She was reported to becoming increasingly weak at home  with poor po intake. She was readmitted for dehydration, HCAP due  to LLL infiltrate.     Assessment / Plan / Recommendation Clinical Impression  Dysphagia Diagnosis: Moderate oral phase dysphagia;Severe oral  phase dysphagia Clinical impression: Pt. exhibits a moderate oral phase dysphagia  and severe pharyngeal dysphagia, characterized by weak lingual  movements, resulting in delayed oral transit and poor posterier  propulsion of the bolus orally and pharyngeally; decreased  laryngeal elevation and hyoid excursion, and epiglottic  deflection during the swallow, resulting in penetration of both  nectar and honey consistencies;  and decreased base of tongue  contraction to the posterior pharyngeal wall and decreased  pharyngeal perestalsis, resulting in vallucular residue with  puree, with risk of aspiration after the swallow.  Pt. was unable  to f/c's for compensatory strategies, but finally was able to  complete a dry swallow which decreased the laryngeal stasis.   This required max verbal and tactile cues, and would not be  possible to complete after every swallow.  Pt. is at risk for  aspiration with any/all possible consistencies at this time.    Treatment Recommendation  Defer treatment plan to SLP at (Comment) (Defer plan until GOC  determined)    Diet Recommendation NPO (Until MD talks with family)        Other  Recommendations     Follow Up Recommendations  Skilled Nursing facility;24 hour supervision/assistance     Frequency and Duration        Pertinent Vitals/Pain n/a    SLP Swallow Goals     General Date of Onset: 01/04/13 HPI: Ashley Rubio was recently discharged from our hospital for  encephalopathy due to presumed UTI that worsened her underlying  dementia. She was reported to becoming increasingly weak at home  with poor po intake. She was readmitted for dehydration, HCAP due  to LLL infiltrate. Type of Study: Modified Barium Swallowing Study Reason for Referral: Objectively evaluate swallowing function Previous Swallow Assessment: BSE 01/06/13 Diet Prior to this Study: Dysphagia 1 (puree);Nectar-thick  liquids Temperature Spikes Noted: No Respiratory Status: Room air History of Recent Intubation: No Behavior/Cognition: Alert;Distractible;Requires cueing;Doesn't  follow directions;Decreased sustained  attention;Impulsive;Confused Oral Cavity - Dentition: Dentures, top;Dentures, bottom Oral Motor / Sensory Function: Within functional limits Self-Feeding Abilities: Total assist Patient Positioning: Upright in chair Baseline Vocal Quality: Clear Volitional Cough: Congested Volitional Swallow: Unable to elicit Anatomy: Within functional limits Pharyngeal Secretions: Not observed secondary MBS    Reason for Referral Objectively evaluate swallowing function   Oral Phase Oral Preparation/Oral Phase Oral Phase: Impaired Oral - Honey Oral - Honey Teaspoon: Right anterior bolus  loss;Weak lingual  manipulation;Incomplete tongue to palate contact;Reduced  posterior propulsion;Piecemeal swallowing;Delayed oral  transit;Left anterior bolus loss Oral - Nectar Oral - Nectar Teaspoon: Left anterior bolus loss;Right anterior  bolus loss;Weak lingual manipulation;Incomplete tongue to palate  contact;Piecemeal swallowing;Delayed oral transit Oral - Nectar Cup: Left anterior bolus loss;Right anterior bolus  loss;Weak lingual manipulation;Incomplete tongue to palate  contact;Piecemeal swallowing;Delayed oral transit Oral - Solids Oral - Puree:  Left anterior bolus loss;Right anterior bolus  loss;Weak lingual manipulation;Incomplete tongue to palate  contact;Lingual/palatal residue;Piecemeal swallowing;Delayed oral  transit   Pharyngeal Phase Pharyngeal Phase Pharyngeal Phase: Impaired Pharyngeal - Honey Pharyngeal - Honey Teaspoon: Reduced pharyngeal  peristalsis;Reduced epiglottic inversion;Reduced anterior  laryngeal mobility;Reduced laryngeal elevation;Reduced tongue  base retraction;Penetration/Aspiration after swallow;Compensatory  strategies attempted (Comment);Pharyngeal residue - posterior  pharnyx (Pt. unable to f/c's for compensatory strategies.) Penetration/Aspiration details (honey teaspoon): Material enters  airway, remains ABOVE vocal cords then ejected out Pharyngeal - Nectar Pharyngeal - Nectar Cup: Reduced epiglottic inversion;Reduced  anterior laryngeal mobility;Reduced laryngeal elevation;Reduced  tongue base retraction;Penetration/Aspiration during  swallow;Pharyngeal residue - valleculae Penetration/Aspiration details (nectar cup): Material enters  airway, CONTACTS cords and not ejected out Pharyngeal - Solids Pharyngeal - Puree: Delayed swallow initiation;Premature spillage  to valleculae;Reduced pharyngeal peristalsis;Reduced epiglottic  inversion;Reduced anterior laryngeal mobility;Reduced laryngeal  elevation;Reduced tongue base retraction;Pharyngeal residue -  valleculae (Double swallow difficult to obtain, but reduced  residue.)  Cervical Esophageal Phase    GO    Cervical Esophageal Phase Cervical Esophageal Phase: Vicente Masson T 01/15/2013, 12:34 PM    Medications: I have reviewed the patient's current medications. Scheduled Meds: . antiseptic oral rinse  15 mL Mouth Rinse q12n4p  . aztreonam  2 g Intravenous Q8H  . budesonide-formoterol  2 puff Inhalation BID  . chlorhexidine  15 mL Mouth Rinse BID  . donepezil  5 mg Oral QHS  . feeding supplement  1 Container Oral TID BM  . haloperidol lactate  1 mg  Intravenous Once  . metoprolol  5 mg Intravenous Q6H  . vancomycin  750 mg Intravenous Q12H  . warfarin  3 mg Oral ONCE-1800  . Warfarin - Pharmacist Dosing Inpatient   Does not apply q1800   Continuous Infusions: . sodium chloride Stopped (01/14/13 0603)   PRN Meds:.RESOURCE THICKENUP CLEAR Assessment/Plan: Ashley Rubio is a 77 year old female with past medical history of atrial fibrillation on Coumadin, digoxin and metoprolol; hypertension; and a recent hospitalization here at [facility] for urinary tract infection with altered mental status, who presents today with a chief complaint of diffuse weakness and productive cough, found to have a left lower lobe infiltrate on chest x-ray.   #Health care-acquired pneumonia - Patient more listless this AM and with cough.  X-ray at admission revealed LLL infiltrate now repeat CXR shows evidence of pulmonary edema and B/L lower lobe infiltrate (L>R) - continue Aztreonam and vancomycin IV for HCAP (anaphylaxis to penicillin)  - Blood cultures pending, no growth thus far; follow-up sputum culture and gram stain   #UTI - repeat urine culture positive for enterococcus  - patient already on vancomycin  - foley discontinued   #Decreased appetite - had lengthy discussion with the family about options for feeding and explained that pt is at significant risk of aspiration with any diet.  Explained that PEG would be a permanent option but since the patient has dementia she could potentially try to pull the tube and encounter complications as a result.  NG tube can  be placed as a temporary measure and we can see how the patient tolerates it.  The patient and family decided to move forward with NG tube placement.  They are also agreeable to a palliative care consult to discuss the patient's goals of care. - NPO diet recommended by SLP as patient failed bedside swallow eval and MBS. - pt NPO - NG tube placement tomorrow morning - nutrition consulted for tube feed  recommendations - palliative care consulted to meet with patient and family RE: goals of care  #Diffuse weakness - Likely multifactorial as patient is deconditioned from multiple recent hospital stays, has infection (HCAP) and meets criteria for severe malnutrition. No signs of stroke on CT but patient now with LUE weakness. - MRI brain - NS infusion @100ml /her  - Treating HCAP as above  - will start NG tube feeds tomorrow - Will continue to monitor electrolytes and replete as necessary   #Delirium/encephalopathy - Stable since discharge. She remains confused and disoriented, but alert and non-agitated. Likely multifactorial etiology for same reasons as above. She probably has undergone a worsening of her underlying dementia and will not return to her baseline of living independently, and family is aware of this. Subacute stroke is important to consider given history of stepwise decline, especially with the suggestion of left atrial filling defect on CT chest. Not currently agitated or delirious.  - continue Aricept   #Atrial fibrillation - Patient is therapeutic on coumadin (INR = 2.17). Possible small left atrial filling defect on CT.  - Coumadin per pharmacy  - metoprolol 25mg  BID --> 5mg  IV q6h (with hold parameters), holding dig   #HTN - hold lisinopril, continue metoprolol  #COPD - Stable  - continue Symbicort inhaler   #Hyperkalemia - resolved, 4.2 today. K elevated two nights ago, no EKG changes.  - AM BMP  #Heart murmer - Soft holosystolic murmur best heard at the apex. History of mitral regurgitation. Afebrile. No stigmata of endocarditis on exam.  - Blood cultures pending  - no vegetations on echo   #DVT PPX - coumadin  Dispo: Disposition is deferred at this time, awaiting improvement of current medical problems.  Anticipated discharge in approximately 1-2 day(s).   The patient does have a current PCP (S Gillermo Murdoch, MD) and does need an [facility] hospital follow-up  appointment after discharge.  The patient does not have transportation limitations that hinder transportation to clinic appointments.  .Services Needed at time of discharge: Y = Yes, Blank = No PT:   OT:   RN:   Equipment:   Other:     LOS: 5 days   Evelena Peat, DO 01/15/2013, 6:35 PM

## 2013-01-15 NOTE — Progress Notes (Signed)
Call placed to Dr.Wilson to discuss patients blood pressure 154/118, HR ranging from 120-140's. New order received.

## 2013-01-15 NOTE — Progress Notes (Signed)
ANTICOAGULATION & ANTIBIOTIC CONSULT NOTE - Follow Up Consult  Pharmacy Consult for Warfarin & Vancomycin + Azactam per MD Indication: Hx Afib and r/o HCAP  Allergies  Allergen Reactions  . Penicillins Anaphylaxis and Swelling  . Ambien (Zolpidem Tartrate)     tachycardia    . Aspirin Nausea And Vomiting  . Codeine Itching  . Tartrazine Nausea Only    Patient Measurements: Height: 5\' 6"  (167.6 cm) Weight: 148 lb 12.8 oz (67.495 kg) IBW/kg (Calculated) : 59.3  Vital Signs: Temp: 97.8 F (36.6 C) (08/12 0549) Temp src: Axillary (08/12 0549) BP: 155/94 mmHg (08/12 0549) Pulse Rate: 82 (08/12 0549)  Labs:  Recent Labs  01/13/13 0400  01/14/13 0656 01/14/13 1020  01/14/13 2256 01/15/13 0330 01/15/13 0615  HGB 12.4  --  12.2  --   --   --   --   --   HCT 36.7  --  36.5  --   --   --   --   --   PLT 223  --  237  --   --   --   --   --   LABPROT 28.3*  --  26.6* 24.7*  --   --   --  23.5*  INR 2.77*  --  2.55* 2.32*  --   --   --  2.17*  CREATININE 0.52  < > 0.54 0.54  < > 0.59 0.59 0.67  < > = values in this interval not displayed.  Estimated Creatinine Clearance: 56.9 ml/min (by C-G formula based on Cr of 0.67).   Assessment: 77 y.o. F on warfarin for hx Afib with a therapeutic INR this morning though trending down (INR 2.17 << 2.32, goal of 2-3). No CBC today, no overt s/sx of bleeding noted. The patient is still eating poorly -- which will increase warfarin sensitivity.  Today is Vancomycin-Rx + Azactam-MD D#5 for empiric HCAP coverage. Afebrile, WBC wnl, SCr 0.67, CrCl~50-60 ml/min. A Vancomycin trough this morning was SUBtherapeutic (Vanc trough 13.1 mcg/ml, goal of 15-20 mcg/ml). Will increase dose to achieve goal target range.  Goal of Therapy:  INR 2-3 Vancomycin trough of 15-20 mcg/ml   Plan:  1. Warfarin 3 mg x 1 dose at 1800 today 2. Increase Vancomycin to 750 mg IV every 12 hours 3. Will continue to monitor for any signs/symptoms of bleeding and will  follow up with PT/INR in the a.m.  4. Will continue to follow renal function, culture results, LOT, and antibiotic de-escalation plans   Georgina Pillion, PharmD, BCPS Clinical Pharmacist Pager: [phone] 01/15/2013 9:07 AM

## 2013-01-15 NOTE — Progress Notes (Signed)
  Date: 01/15/2013  Patient name: Ashley Rubio  Medical record number: [id number]  Date of birth: [birth year]   This patient has been seen and the plan of care was discussed with the house staff. Please see their note for complete details. I concur with their findings with the following additions/corrections:  Agree with note as outlined by Dr. Andrey Campanile. [name] has worsening dementia, poor energy, and at risk for aspiration due to poor swallow function. Will confirm with swallow study. Likely to get NG tube/dobhoff for tube feeds. Will watch for refeeding syndrome. Will need to discuss goals of care with family.  Judyann Munson, MD 01/15/2013, 7:48 PM

## 2013-01-15 NOTE — Progress Notes (Signed)
Physical Therapy Treatment Patient Details Name: Ashley Rubio MRN: [id number] DOB: 11-Oct-1935 Today's Date: 01/15/2013 Time: 1201-1224 PT Time Calculation (min): 23 min  PT Assessment / Plan / Recommendation  History of Present Illness 77yo F with afbi who previously was independently living on her own until June where she has had significant decline in her health, has had 2 hospitalizations at [facility] one for UTI, she was recently placed on digoxin as well as haldol by SNF when she was discharged to home. Now presents with lethargy, altered mental status, suprapubic pain. AMS likely due to several factors including infection +/- drug side effects from digoxin and haldol   PT Comments   Pt with wet cough and speech upon entering room likely due to swallow evaluation just prior to arrival.  Pt encouraged to cough and spit out any fluids with daughter also encouraging however pt not grasping spitting out and/or yonker very well.  Encouraged pt's daughter to continue cues for spitting out secretions/fluids and maintaining upright trunk posture/positioning.  Pt unable to sit in recliner to due tachycardia upon sitting EOB so assist back to supine with elevated HOB.  Observed decreased use of L UE and pt unable to perform grip with cues however did use R UE to assist with transfers.  Pt with SaO2 87% on room air upon returning to supine so applied 2L O2 [place] and notified RN.   Follow Up Recommendations  SNF;Supervision/Assistance - 24 hour     Does the patient have the potential to tolerate intense rehabilitation     Barriers to Discharge        Equipment Recommendations  Wheelchair (measurements PT)    Recommendations for Other Services    Frequency     Progress towards PT Goals Progress towards PT goals: Not progressing toward goals - comment (back from swallow eval, wet cough, tachycardia with sitting)  Plan Current plan remains appropriate    Precautions / Restrictions  Precautions Precautions: Fall Precaution Comments: monitor sats   Pertinent Vitals/Pain SaO2 90-92% room air supine at rest SaO2 87% room air upon return to supine HR increased to 150s per nsg staff with sitting pt EOB    Mobility  Bed Mobility Bed Mobility: Supine to Sit;Sit to Supine;Scooting to HOB Supine to Sit: 1: +2 Total assist Supine to Sit: Patient Percentage: 0% Sit to Supine: 1: +1 Total assist Sit to Supine: Patient Percentage: 0% Scooting to HOB: 1: +2 Total assist Details for Bed Mobility Assistance: +2 assist with all mobility, verbal cues given with time however pt either unable to process or physically not capable due to weakness and required assist, HR up to 150's per nsg staff with pt sitting EOB so assisted back to supine Transfers Transfers: Not assessed    Exercises     PT Diagnosis:    PT Problem List:   PT Treatment Interventions:     PT Goals (current goals can now be found in the care plan section)    Visit Information  Last PT Received On: 01/15/13 Assistance Needed: +2 History of Present Illness: 77yo F with afbi who previously was independently living on her own until June where she has had significant decline in her health, has had 2 hospitalizations at [facility] one for UTI, she was recently placed on digoxin as well as haldol by SNF when she was discharged to home. Now presents with lethargy, altered mental status, suprapubic pain. AMS likely due to several factors including infection +/- drug side effects from digoxin  and haldol    Subjective Data      Cognition  Cognition Arousal/Alertness: Lethargic Overall Cognitive Status: No family/caregiver present to determine baseline cognitive functioning Area of Impairment: Following commands;Problem solving Current Attention Level: Focused Memory: Decreased short-term memory Following Commands: Follows one step commands inconsistently;Follows one step commands with increased time Problem Solving:  Slow processing;Decreased initiation;Difficulty sequencing;Requires verbal cues;Requires tactile cues    Balance  Balance Balance Assessed: Yes Static Sitting Balance Static Sitting - Balance Support: Bilateral upper extremity supported;Feet supported Static Sitting - Level of Assistance: 1: +1 Total assist Static Sitting - Comment/# of Minutes: pt states unable to keep trunk upright requiring assist sitting EOB  End of Session PT - End of Session Activity Tolerance: Patient limited by fatigue;Other (comment) (tachycardia) Patient left: in bed;with call bell/phone within reach;with bed alarm set;with family/visitor present Nurse Communication: Other (comment) (SaO2 87% so applied 2L O2 [place])   GP     Esty Ahuja,Ashley Rubio 01/15/2013, 1:43 PM Zenovia Jarred, PT, DPT 01/15/2013 Pager: [phone]

## 2013-01-16 DIAGNOSIS — R1310 Dysphagia, unspecified: Secondary | ICD-10-CM

## 2013-01-16 DIAGNOSIS — R4182 Altered mental status, unspecified: Secondary | ICD-10-CM | POA: Diagnosis not present

## 2013-01-16 DIAGNOSIS — I63139 Cerebral infarction due to embolism of unspecified carotid artery: Secondary | ICD-10-CM | POA: Diagnosis present

## 2013-01-16 DIAGNOSIS — R627 Adult failure to thrive: Secondary | ICD-10-CM | POA: Diagnosis not present

## 2013-01-16 DIAGNOSIS — R5383 Other fatigue: Secondary | ICD-10-CM

## 2013-01-16 DIAGNOSIS — Z515 Encounter for palliative care: Secondary | ICD-10-CM | POA: Diagnosis not present

## 2013-01-16 DIAGNOSIS — I63239 Cerebral infarction due to unspecified occlusion or stenosis of unspecified carotid arteries: Secondary | ICD-10-CM

## 2013-01-16 DIAGNOSIS — R5381 Other malaise: Secondary | ICD-10-CM | POA: Diagnosis not present

## 2013-01-16 DIAGNOSIS — I4891 Unspecified atrial fibrillation: Secondary | ICD-10-CM

## 2013-01-16 LAB — LIPID PANEL
Cholesterol: 155 mg/dL (ref 0–200)
Total CHOL/HDL Ratio: 5.5 RATIO

## 2013-01-16 LAB — PROTIME-INR
INR: 1.77 — ABNORMAL HIGH (ref 0.00–1.49)
Prothrombin Time: 20.1 seconds — ABNORMAL HIGH (ref 11.6–15.2)

## 2013-01-16 LAB — HEMOGLOBIN A1C
Hgb A1c MFr Bld: 5.6 % (ref <5.7)
Mean Plasma Glucose: 114 mg/dL (ref <117)

## 2013-01-16 LAB — BASIC METABOLIC PANEL
Chloride: 104 mEq/L (ref 96–112)
GFR calc Af Amer: >90 mL/min (ref >90)
Potassium: 3.9 mEq/L (ref 3.5–5.1)
Sodium: 136 mEq/L (ref 135–145)

## 2013-01-16 LAB — CBC WITH DIFFERENTIAL/PLATELET
Basophils Absolute: 0 10*3/uL (ref 0.0–0.1)
HCT: 37.7 % (ref 36.0–46.0)
Hemoglobin: 12.6 g/dL (ref 12.0–15.0)
Lymphocytes Relative: 7 % — ABNORMAL LOW (ref 12–46)
Monocytes Absolute: 0.8 10*3/uL (ref 0.1–1.0)
Monocytes Relative: 7 % (ref 3–12)
Neutro Abs: 9.2 10*3/uL — ABNORMAL HIGH (ref 1.7–7.7)
WBC: 10.9 10*3/uL — ABNORMAL HIGH (ref 4.0–10.5)

## 2013-01-16 LAB — CULTURE, BLOOD (ROUTINE X 2): Culture: NO GROWTH

## 2013-01-16 LAB — PHOSPHORUS: Phosphorus: 3 mg/dL (ref 2.3–4.6)

## 2013-01-16 MED ORDER — ASPIRIN 300 MG RE SUPP
150.0000 mg | Freq: Every day | RECTAL | Status: DC
  Administered 2013-01-16: 150 mg via RECTAL
  Filled 2013-01-16: qty 1

## 2013-01-16 MED ORDER — ASPIRIN 300 MG RE SUPP: 300.0000 mg | Freq: Every day | RECTAL | Status: DC

## 2013-01-16 MED ORDER — JEVITY 1.2 CAL PO LIQD
1000.0000 mL | ORAL | Status: DC
  Filled 2013-01-16 (×6): qty 1000

## 2013-01-16 MED ORDER — HEPARIN (PORCINE) IN NACL 100-0.45 UNIT/ML-% IJ SOLN
1200.0000 [IU]/h | INTRAMUSCULAR | Status: DC
  Administered 2013-01-16: 800 [IU]/h via INTRAVENOUS
  Administered 2013-01-17 – 2013-01-18 (×2): 1200 [IU]/h via INTRAVENOUS
  Filled 2013-01-16 (×3): qty 250

## 2013-01-16 NOTE — Progress Notes (Signed)
Patient has difficulty using MDI effectively with assistance.

## 2013-01-16 NOTE — Progress Notes (Signed)
Clinical Social Work Department BRIEF PSYCHOSOCIAL ASSESSMENT 01/16/2013  Patient:  [facility]     Account Number:  [id number]     Admit date:  01/10/2013  Clinical Social Worker:  Robin Searing  Date/Time:  01/16/2013 02:20 PM  Referred by:  Physician  Date Referred:  01/15/2013 Referred for  SNF Placement   Other Referral:   Interview type:  Other - See comment Other interview type:   spoke with son and a daughter at bedside    PSYCHOSOCIAL DATA Living Status:  ALONE Admitted from facility:   Level of care:   Primary support name:  son, 2 daughters Primary support relationship to patient:  FAMILY Degree of support available:   good    CURRENT CONCERNS Current Concerns  Post-Acute Placement   Other Concerns:    SOCIAL WORK ASSESSMENT / PLAN Met with family and discussed SNF options- they are plannign to meet with the [facility] Medicine Team and will make further decsions at that time. Family is open to considering SNF and has given me the ok to start the process and SNF search- patient may need a feeding tube per family- as well family is considering goals and quality of life.  Support offered-   Assessment/plan status:  Other - See comment Other assessment/ plan:   FL2 and PASARR for SNF search   Information/referral to community resources:   SNF  EMS    PATIENT'S/FAMILY'S RESPONSE TO PLAN OF CARE: Family appears very concerned and supportive of mom- son holding her hand and talking to her at bedside- he voices interest in taking her home if he can at d/c but is open to SNF search in case this is needed.       Reece Levy, MSW [phone]

## 2013-01-16 NOTE — Progress Notes (Signed)
Subjective: [name] was seen and examined by me this AM.  She opens her eyes to voice but otherwise appears to be sleeping.  She does respond verbally but appears quite weak and somnolent.  Objective: Vital signs in last 24 hours: Filed Vitals:   01/15/13 1745 01/15/13 2043 01/16/13 0616 01/16/13 0920  BP: 154/118 147/86 128/72 118/71  Pulse: 108 82 112 107  Temp: 97.5 F (36.4 C) 98.1 F (36.7 C) 98.6 F (37 C) 98.1 F (36.7 C)  TempSrc:  Oral Axillary Axillary  Resp: 16 17 16 20   Height:  5\' 6"  (1.676 m)    Weight:  66.225 kg (146 lb)    SpO2: 92% 100% 98% 100%   Weight change: -1.27 kg (-2 lb 12.8 oz)  Intake/Output Summary (Last 24 hours) at 01/16/13 1345 Last data filed at 01/16/13 0900  Gross per 24 hour  Intake      0 ml  Output      0 ml  Net      0 ml   General: resting in bed, somnolent Cardiac: distant HS, irregularly irregular rhythm Pulm: poor effort Abd: soft, nontender, nondistended, BS present Ext: no pedal edema Neuro: somnolent, awakens to voice and talking to family members at times  Lab Results: Basic Metabolic Panel:  Recent Labs Lab 01/15/13 0615  01/15/13 2035 01/16/13 0400  NA 140  < > 136 136  K 4.3  < > 4.0 3.9  CL 109  < > 105 104  CO2 24  < > 22 23  GLUCOSE 101*  < > 98 95  BUN 8  < > 8 10  CREATININE 0.67  < > 0.58 0.61  CALCIUM 8.8  < > 8.6 8.7  MG 1.4*  --   --  1.6  PHOS 2.8  --   --  3.0  < > = values in this interval not displayed. Liver Function Tests:  Recent Labs Lab 01/10/13 1524  AST 18  ALT 11  ALKPHOS 96  BILITOT 0.9  PROT 5.9*  ALBUMIN 2.3*   CBC:  Recent Labs Lab 01/15/13 0821 01/16/13 0400  WBC 6.8 10.9*  NEUTROABS 5.0 9.2*  HGB 12.5 12.6  HCT 37.5 37.7  MCV 91.0 90.2  PLT 253 274   Cardiac Enzymes:  Recent Labs Lab 01/10/13 1524  TROPONINI <0.30   Fasting Lipid Panel:  Recent Labs Lab 01/16/13 0400  CHOL 155  HDL 28*  LDLCALC 104*  TRIG 115  CHOLHDL 5.5    Coagulation:  Recent Labs Lab 01/14/13 0656 01/14/13 1020 01/15/13 0615 01/16/13 0400  LABPROT 26.6* 24.7* 23.5* 20.1*  INR 2.55* 2.32* 2.17* 1.77*   Urinalysis:  Recent Labs Lab 01/10/13 1720  COLORURINE AMBER*  LABSPEC 1.017  PHURINE 5.5  GLUCOSEU NEGATIVE  HGBUR SMALL*  BILIRUBINUR LARGE*  KETONESUR 40*  PROTEINUR 30*  UROBILINOGEN 1.0  NITRITE NEGATIVE  LEUKOCYTESUR MODERATE*    Micro Results: Recent Results (from the past 240 hour(s))  URINE CULTURE     Status: None   Collection Time    01/06/13  2:13 PM      Result Value Range Status   Specimen Description URINE, CATHETERIZED   Final   Special Requests NONE   Final   Culture  Setup Time     Final   Value: 01/06/2013 21:58     Performed at [facility] Count     Final   Value: >=100,000 COLONIES/ML  Performed at [facility]     Final   Value: YEAST     Performed at [facility]   Report Status 01/07/2013 FINAL   Final  URINE CULTURE     Status: None   Collection Time    01/10/13  5:20 PM      Result Value Range Status   Specimen Description URINE, CATHETERIZED   Final   Special Requests NONE   Final   Culture  Setup Time     Final   Value: 01/10/2013 19:10     Performed at [facility] Count     Final   Value: 75,000 COLONIES/ML     Performed at [facility]   Culture     Final   Value: ENTEROCOCCUS SPECIES     Performed at [facility]   Report Status 01/12/2013 FINAL   Final   Organism ID, Bacteria ENTEROCOCCUS SPECIES   Final  CULTURE, BLOOD (ROUTINE X 2)     Status: None   Collection Time    01/10/13  5:30 PM      Result Value Range Status   Specimen Description BLOOD ARM LEFT   Final   Special Requests BOTTLES DRAWN AEROBIC AND ANAEROBIC 10CC   Final   Culture  Setup Time     Final   Value: 01/10/2013 23:26     Performed at [facility]   Culture     Final   Value: NO GROWTH 5 DAYS      Performed at [facility]   Report Status 01/16/2013 FINAL   Final  CULTURE, BLOOD (ROUTINE X 2)     Status: None   Collection Time    01/10/13  5:39 PM      Result Value Range Status   Specimen Description BLOOD HAND LEFT   Final   Special Requests BOTTLES DRAWN AEROBIC ONLY 8CC   Final   Culture  Setup Time     Final   Value: 01/10/2013 23:26     Performed at [facility]   Culture     Final   Value: NO GROWTH 5 DAYS     Performed at [facility]   Report Status 01/16/2013 FINAL   Final   Studies/Results: Dg Chest 2 View  01/15/2013   *RADIOLOGY REPORT*  Clinical Data: Cough, coarse breath sounds.  Somnolence. Fatigue.  CHEST - 2 VIEW  Comparison: 01/10/2013  Findings: Heart is enlarged.  There is dense opacity at the left lung base, obscuring the left hemidiaphragm.  Patchy density at the right lung base has increased, consistent with infiltrate. Bilateral pleural effusions are noted.  There are interstitial changes of pulmonary edema.  IMPRESSION:  1.  Cardiomegaly and pulmonary edema. 2.  Bilateral lower lobe infiltrates, left greater than right. 3.  Bilateral small effusions.   Original Report Authenticated By: Norva Pavlov, M.D.   Mr Brain Wo Contrast  01/16/2013   *RADIOLOGY REPORT*  Clinical Data: Weakness and somnolence.  Rule out stroke.  MRI HEAD WITHOUT CONTRAST  Technique:  Multiplanar, multiecho pulse sequences of the brain and surrounding structures were obtained according to standard protocol without intravenous contrast.  Comparison: Head CT 01/08/2011.  Findings:  Calvarium and upper cervical spine: No marrow signal abnormality. Degenerative disc and facet disease with C3-4 retrolisthesis and C4- 5 anterolisthesis.  Orbits: No significant findings.  Sinuses: Clear. Left mastoid effusion, subtotal.  Brain: Patchy acute infarctions  in the right caudate body and subcortical high insular region, extending into the right corona radiata.  There are small (  sub centimeter) cortical infarcts in the right parietal and occipital lobes.  Absent flow related signal loss within the left cervical and cavernous carotid, with reconstitution at the level of the A1 and M1 segments.  Global atrophy, without lobar predominance. Scattered cerebral white matter T2 hyperintense signal abnormalities, usually related to chronic small vessel ischemia.  Ex vacuo ventricular enlargement without hydrocephalus.  No acute hemorrhage or evidence of mass lesion.  Critical Value/emergent results were called by telephone at the time of interpretation on 01/16/2013 at 01:45 to Dr Claudell Kyle, who verbally acknowledged these results.  IMPRESSION:  1. Small, scattered right cerebral hemispheric acute infarctions, both in the MCA and PCA distributions.  2. Occluded (more likely) or slowly flowing left cervical and cavernous ICA. In the absence of left-sided infarction, this is likely chronic.  3.  Advanced global brain atrophy.   Original Report Authenticated By: Tiburcio Pea   Dg Swallowing Func-speech Pathology  01/15/2013   Lenor Derrick, CCC-SLP     01/15/2013 12:34 PM Objective Swallowing Evaluation: Modified Barium Swallowing Study   Patient Details  Name: Skyelyn Scruggs MRN: [id number] Date of Birth: 1936-04-02  Today's Date: 01/15/2013 Time: 1100-1234 SLP Time Calculation (min): 94 min  Past Medical History:  Past Medical History  Diagnosis Date  . Hypertension   . Unspecified venous (peripheral) insufficiency   . Personal history of malignant neoplasm of large intestine   . Cancer     colon  . Varicose veins of lower extremities with other complications    Past Surgical History:  Past Surgical History  Procedure Laterality Date  . Neck surgery  2003  . Tongue biopsy  2003  . Abdominal hysterectomy    . Breast biopsy    . Colonoscopy    . Gastric perforation repair     . Cardioversion    . Leg surgery Left    HPI:  Mrs. Cifelli was recently discharged from our hospital for  encephalopathy due to  presumed UTI that worsened her underlying  dementia. She was reported to becoming increasingly weak at home  with poor po intake. She was readmitted for dehydration, HCAP due  to LLL infiltrate.     Assessment / Plan / Recommendation Clinical Impression  Dysphagia Diagnosis: Moderate oral phase dysphagia;Severe oral  phase dysphagia Clinical impression: Pt. exhibits a moderate oral phase dysphagia  and severe pharyngeal dysphagia, characterized by weak lingual  movements, resulting in delayed oral transit and poor posterier  propulsion of the bolus orally and pharyngeally; decreased  laryngeal elevation and hyoid excursion, and epiglottic  deflection during the swallow, resulting in penetration of both  nectar and honey consistencies;  and decreased base of tongue  contraction to the posterior pharyngeal wall and decreased  pharyngeal perestalsis, resulting in vallucular residue with  puree, with risk of aspiration after the swallow.  Pt. was unable  to f/c's for compensatory strategies, but finally was able to  complete a dry swallow which decreased the laryngeal stasis.   This required max verbal and tactile cues, and would not be  possible to complete after every swallow.  Pt. is at risk for  aspiration with any/all possible consistencies at this time.    Treatment Recommendation  Defer treatment plan to SLP at (Comment) (Defer plan until GOC  determined)    Diet Recommendation NPO (Until MD talks with family)  Other  Recommendations     Follow Up Recommendations  Skilled Nursing facility;24 hour supervision/assistance    Frequency and Duration        Pertinent Vitals/Pain n/a    SLP Swallow Goals     General Date of Onset: 01/04/13 HPI: Mrs. Jurich was recently discharged from our hospital for  encephalopathy due to presumed UTI that worsened her underlying  dementia. She was reported to becoming increasingly weak at home  with poor po intake. She was readmitted for dehydration, HCAP due  to LLL  infiltrate. Type of Study: Modified Barium Swallowing Study Reason for Referral: Objectively evaluate swallowing function Previous Swallow Assessment: BSE 01/06/13 Diet Prior to this Study: Dysphagia 1 (puree);Nectar-thick  liquids Temperature Spikes Noted: No Respiratory Status: Room air History of Recent Intubation: No Behavior/Cognition: Alert;Distractible;Requires cueing;Doesn't  follow directions;Decreased sustained  attention;Impulsive;Confused Oral Cavity - Dentition: Dentures, top;Dentures, bottom Oral Motor / Sensory Function: Within functional limits Self-Feeding Abilities: Total assist Patient Positioning: Upright in chair Baseline Vocal Quality: Clear Volitional Cough: Congested Volitional Swallow: Unable to elicit Anatomy: Within functional limits Pharyngeal Secretions: Not observed secondary MBS    Reason for Referral Objectively evaluate swallowing function   Oral Phase Oral Preparation/Oral Phase Oral Phase: Impaired Oral - Honey Oral - Honey Teaspoon: Right anterior bolus loss;Weak lingual  manipulation;Incomplete tongue to palate contact;Reduced  posterior propulsion;Piecemeal swallowing;Delayed oral  transit;Left anterior bolus loss Oral - Nectar Oral - Nectar Teaspoon: Left anterior bolus loss;Right anterior  bolus loss;Weak lingual manipulation;Incomplete tongue to palate  contact;Piecemeal swallowing;Delayed oral transit Oral - Nectar Cup: Left anterior bolus loss;Right anterior bolus  loss;Weak lingual manipulation;Incomplete tongue to palate  contact;Piecemeal swallowing;Delayed oral transit Oral - Solids Oral - Puree: Left anterior bolus loss;Right anterior bolus  loss;Weak lingual manipulation;Incomplete tongue to palate  contact;Lingual/palatal residue;Piecemeal swallowing;Delayed oral  transit   Pharyngeal Phase Pharyngeal Phase Pharyngeal Phase: Impaired Pharyngeal - Honey Pharyngeal - Honey Teaspoon: Reduced pharyngeal  peristalsis;Reduced epiglottic inversion;Reduced anterior  laryngeal  mobility;Reduced laryngeal elevation;Reduced tongue  base retraction;Penetration/Aspiration after swallow;Compensatory  strategies attempted (Comment);Pharyngeal residue - posterior  pharnyx (Pt. unable to f/c's for compensatory strategies.) Penetration/Aspiration details (honey teaspoon): Material enters  airway, remains ABOVE vocal cords then ejected out Pharyngeal - Nectar Pharyngeal - Nectar Cup: Reduced epiglottic inversion;Reduced  anterior laryngeal mobility;Reduced laryngeal elevation;Reduced  tongue base retraction;Penetration/Aspiration during  swallow;Pharyngeal residue - valleculae Penetration/Aspiration details (nectar cup): Material enters  airway, CONTACTS cords and not ejected out Pharyngeal - Solids Pharyngeal - Puree: Delayed swallow initiation;Premature spillage  to valleculae;Reduced pharyngeal peristalsis;Reduced epiglottic  inversion;Reduced anterior laryngeal mobility;Reduced laryngeal  elevation;Reduced tongue base retraction;Pharyngeal residue -  valleculae (Double swallow difficult to obtain, but reduced  residue.)  Cervical Esophageal Phase    GO    Cervical Esophageal Phase Cervical Esophageal Phase: Vicente Masson T 01/15/2013, 12:34 PM    Medications: I have reviewed the patient's current medications. Scheduled Meds: . antiseptic oral rinse  15 mL Mouth Rinse q12n4p  . aspirin  150 mg Rectal Daily  . aztreonam  2 g Intravenous Q8H  . budesonide-formoterol  2 puff Inhalation BID  . chlorhexidine  15 mL Mouth Rinse BID  . donepezil  5 mg Oral QHS  . haloperidol lactate  1 mg Intravenous Once  . metoprolol  5 mg Intravenous Q6H  . vancomycin  750 mg Intravenous Q12H  . Warfarin - Pharmacist Dosing Inpatient   Does not apply q1800   Continuous Infusions: . sodium chloride  Stopped (01/14/13 0603)  . feeding supplement (JEVITY 1.2 CAL)     PRN Meds:.RESOURCE THICKENUP CLEAR Assessment/Plan: [name] is a 77 year old female with past medical history of atrial  fibrillation on Coumadin, digoxin and metoprolol; hypertension; and a recent hospitalization here at [facility] for urinary tract infection with altered mental status, who presents today with a chief complaint of diffuse weakness and productive cough, found to have a left lower lobe infiltrate on chest x-ray.   #New ischemic CVA - multiple infarcts of Right MCA and PCA evident on MRI.  Had discussion with family regarding poor prognosis with new stroke compounding previous issues of dementia, malnourishment/swallow dysfunction, HCAP.  Met with the family and palliative care this afternoon to discuss goals of care:  Aggressive treatment (i.e. PEG) vs palliative care.  Palliative asked the patient her wishes and Ms. Loisel said that she wants the PEG.  The family has decided to move forward with PEG placement. - Neuro recommends heparin for anticoagulation in the setting of Afib and RUE DVT; they suggest switching the patient to Eliquis once she gets PEG.  #Health care-acquired pneumonia - Patient more listless yesterday and today with cough. X-ray at admission revealed LLL infiltrate now repeat CXR shows evidence of pulmonary edema and B/L lower lobe infiltrate (L>R)  - continue Aztreonam and vancomycin IV for HCAP vs possible aspiration PNA - Blood cultures negative; follow-up sputum culture and gram stain   #UTI - repeat urine culture positive for enterococcus  - patient on vancomycin   #Decreased appetite - had lengthy discussion with the family about options for feeding and explained that pt is at significant risk of aspiration with any diet. Explained that PEG would be a permanent option but since the patient has dementia she could potentially try to pull the tube and encounter complications as a result. NG could be placed temporarily but the family feel the patient would be more irritated by it as she has a history of throat cancer.  The patient and family decided to move forward with PEG. - NPO  diet recommended by SLP as patient failed bedside swallow eval and MBS.  - pt NPO - to IR for PEG tomorrow or Friday  #Diffuse weakness - Likely multifactorial as patient is deconditioned from multiple recent hospital stays, has infection (HCAP) and meets criteria for severe malnutrition. No signs of stroke on CT but patient now with LUE weakness.  MRI brain indicates acute right MCA and PCA infarctions.  Neurology has evaluated the patient and their recommendations are appreciated.  - Treating HCAP as above  - for PEG this week - Will continue to monitor electrolytes and replete as necessary   #Delirium/encephalopathy - Stable since discharge. She is somnolent, non-agitated.  She probably has undergone a worsening of her underlying dementia compounded by new stroke and will not return to her baseline of living independently, and family is aware of this.   - continue Aricept  - SNF vs home after d/c  #Atrial fibrillation - Patient is subtherapeutic (1.77) today and she did miss her coumadin dose last night (could not swallow). - Coumadin d/c as patient is NPO - Neuro recommends heparin for anticoagulation in the setting of Afib and RUE DVT; they suggest switching the patient to Eliquis once she gets PEG. - 5mg  IV metoprolol q6h (with hold parameters), holding dig   #HTN - hold lisinopril, continue metoprolol   #COPD - Stable  - continue Symbicort inhaler   #Hyperkalemia - resolved - AM  BMP   #Heart murmer - Soft holosystolic murmur best heard at the apex. History of mitral regurgitation. Afebrile. No stigmata of endocarditis on exam.  - Blood cultures pending  - no vegetations on echo   #DVT PPX - IV heparin  Dispo: Disposition is deferred at this time, awaiting improvement of current medical problems.  Anticipated discharge in approximately 1-2 day(s).   The patient does have a current PCP (S Gillermo Murdoch, MD) and does need an [facility] hospital follow-up appointment after  discharge.  The patient does not have transportation limitations that hinder transportation to clinic appointments.  .Services Needed at time of discharge: Y = Yes, Blank = No PT:   OT:   RN:   Equipment:   Other:     LOS: 6 days   Evelena Peat, DO 01/16/2013, 1:45 PM

## 2013-01-16 NOTE — Consult Note (Signed)
Referring Physician: Dr. Drue Second    Chief Complaint: Confusion with reduced level of responsiveness.  HPI: Ashley Rubio is an 77 y.o. female history of atrial fibrillation on anticoagulation, hypertension and dementia who was admitted on 01/10/2013 for management of acute pneumonia. She's had persistent altered mental status with reduced level of responsiveness. MRI was obtained on 01/15/2013 which showed multiple small cerebral infarctions involving middle and posterior cerebral artery territories on the right. 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 This was thought to probably be chronic since patient had no signs of acute left cerebral infarction. Time of onset of infarctions is unclear. Patient's INR was therapeutic at 2.17. She was recently hospitalized for urinary tract infection and associated encephalopathy, but subsequently improved and was discharged.  LSN: Unknown tPA Given: No: Unclear when patient was last known well MRankin: 4  Past Medical History  Diagnosis Date  . Hypertension   . Unspecified venous (peripheral) insufficiency   . Personal history of malignant neoplasm of large intestine   . Cancer     colon  . Varicose veins of lower extremities with other complications     Family History  Problem Relation Age of Onset  . Kidney cancer Mother      Medications: I have reviewed the patient's current medications.  ROS: History obtained from child  General ROS: negative for - chills, fatigue, fever, night sweats, weight gain or weight loss Psychological ROS: Positive for progressive dementia Ophthalmic ROS: negative for - blurry vision, double vision, eye pain or loss of vision ENT ROS: negative for - epistaxis, nasal discharge, oral lesions, sore throat, tinnitus or vertigo Allergy and Immunology ROS: negative for - hives or itchy/watery eyes Hematological and Lymphatic ROS: negative for -  bleeding problems, bruising or swollen lymph nodes Endocrine ROS: negative for - galactorrhea, hair pattern changes, polydipsia/polyuria or temperature intolerance Respiratory ROS: Current admission for acute pneumonia Cardiovascular ROS: negative for - chest pain, dyspnea on exertion, edema or irregular heartbeat Gastrointestinal ROS: negative for - abdominal pain, diarrhea, hematemesis, nausea/vomiting or stool incontinence Genito-Urinary ROS: Recent admission for urinary tract infection Musculoskeletal ROS: negative for - joint swelling or muscular weakness Neurological ROS: as noted in HPI Dermatological ROS: negative for rash and skin lesion changes  Physical Examination: Blood pressure 147/86, pulse 82, temperature 98.1 F (36.7 C), temperature source Oral, resp. rate 17, height 5\' 6"  (1.676 m), weight 66.225 kg (146 lb), SpO2 100.00%.  Neurologic Examination: Mental Status: Stuporous and difficult to arouse.  Speech is markedly slurred. Patient did not follow commands II-Visual fields difficult to assess because of her stuporous state. III/IV/VI-Pupils were equal and reacted. Extraocular movements were intact to oculocephalic maneuvers.    VII- no facial weakness. VIII-normal. X-markedly slurred speech. Motor: Strength was difficult to assess because patient did not follow commands with manual testing; muscle tone was flaccid throughout. Sensory: Withdrew extremities equally to noxious stimuli. Deep Tendon Reflexes: Trace only and symmetric. Plantars: Extensor on the left than flexor on the right Cerebellar: Unable to test.  Dg Chest 2 View  01/15/2013   *RADIOLOGY REPORT*  Clinical Data: Cough, coarse breath sounds.  Somnolence. Fatigue.  CHEST - 2 VIEW  Comparison: 01/10/2013  Findings: Heart is enlarged.  There is dense opacity at the left lung base, obscuring the left hemidiaphragm.  Patchy density at the right lung base has increased, consistent with infiltrate. Bilateral  pleural effusions are noted.  There are interstitial changes of  pulmonary edema.  IMPRESSION:  1.  Cardiomegaly and pulmonary edema. 2.  Bilateral lower lobe infiltrates, left greater than right. 3.  Bilateral small effusions.   Original Report Authenticated By: Norva Pavlov, M.D.   Mr Brain Wo Contrast  01/16/2013   *RADIOLOGY REPORT*  Clinical Data: Weakness and somnolence.  Rule out stroke.  MRI HEAD WITHOUT CONTRAST  Technique:  Multiplanar, multiecho pulse sequences of the brain and surrounding structures were obtained according to standard protocol without intravenous contrast.  Comparison: Head CT 01/08/2011.  Findings:  Calvarium and upper cervical spine: No marrow signal abnormality. Degenerative disc and facet disease with C3-4 retrolisthesis and C4- 5 anterolisthesis.  Orbits: No significant findings.  Sinuses: Clear. Left mastoid effusion, subtotal.  Brain: Patchy acute infarctions in the right caudate body and subcortical high insular region, extending into the right corona radiata.  There are small ( sub centimeter) cortical infarcts in the right parietal and occipital lobes.  Absent flow related signal loss within the left cervical and cavernous carotid, with reconstitution at the level of the A1 and M1 segments.  Global atrophy, without lobar predominance. Scattered cerebral white matter T2 hyperintense signal abnormalities, usually related to chronic small vessel ischemia.  Ex vacuo ventricular enlargement without hydrocephalus.  No acute hemorrhage or evidence of mass lesion.  Critical Value/emergent results were called by telephone at the time of interpretation on 01/16/2013 at 01:45 to Dr Claudell Kyle, who verbally acknowledged these results.  IMPRESSION:  1. Small, scattered right cerebral hemispheric acute infarctions, both in the MCA and PCA distributions.  2. Occluded (more likely) or slowly flowing left cervical and cavernous ICA. In the absence of left-sided infarction, this is likely  chronic.  3.  Advanced global brain atrophy.   Original Report Authenticated By: Tiburcio Pea   Dg Swallowing Func-speech Pathology  01/15/2013   Lenor Derrick, CCC-SLP     01/15/2013 12:34 PM Objective Swallowing Evaluation: Modified Barium Swallowing Study   Patient Details  Name: Ashley Rubio MRN: [id number] Date of Birth: 07/10/1935  Today's Date: 01/15/2013 Time: 1100-1234 SLP Time Calculation (min): 94 min  Past Medical History:  Past Medical History  Diagnosis Date  . Hypertension   . Unspecified venous (peripheral) insufficiency   . Personal history of malignant neoplasm of large intestine   . Cancer     colon  . Varicose veins of lower extremities with other complications    Past Surgical History:  Past Surgical History  Procedure Laterality Date  . Neck surgery  2003  . Tongue biopsy  2003  . Abdominal hysterectomy    . Breast biopsy    . Colonoscopy    . Gastric perforation repair     . Cardioversion    . Leg surgery Left    HPI:  Mrs. Dooley was recently discharged from our hospital for  encephalopathy due to presumed UTI that worsened her underlying  dementia. She was reported to becoming increasingly weak at home  with poor po intake. She was readmitted for dehydration, HCAP due  to LLL infiltrate.     Assessment / Plan / Recommendation Clinical Impression  Dysphagia Diagnosis: Moderate oral phase dysphagia;Severe oral  phase dysphagia Clinical impression: Pt. exhibits a moderate oral phase dysphagia  and severe pharyngeal dysphagia, characterized by weak lingual  movements, resulting in delayed oral transit and poor posterier  propulsion of the bolus orally and pharyngeally; decreased  laryngeal elevation and hyoid excursion, and epiglottic  deflection during the swallow, resulting in penetration of both  nectar and honey consistencies;  and decreased base of tongue  contraction to the posterior pharyngeal wall and decreased  pharyngeal perestalsis, resulting in vallucular residue with  puree,  with risk of aspiration after the swallow.  Pt. was unable  to f/c's for compensatory strategies, but finally was able to  complete a dry swallow which decreased the laryngeal stasis.   This required max verbal and tactile cues, and would not be  possible to complete after every swallow.  Pt. is at risk for  aspiration with any/all possible consistencies at this time.    Treatment Recommendation  Defer treatment plan to SLP at (Comment) (Defer plan until GOC  determined)    Diet Recommendation NPO (Until MD talks with family)        Other  Recommendations     Follow Up Recommendations  Skilled Nursing facility;24 hour supervision/assistance    Frequency and Duration        Pertinent Vitals/Pain n/a    SLP Swallow Goals     General Date of Onset: 01/04/13 HPI: Mrs. Malcolm was recently discharged from our hospital for  encephalopathy due to presumed UTI that worsened her underlying  dementia. She was reported to becoming increasingly weak at home  with poor po intake. She was readmitted for dehydration, HCAP due  to LLL infiltrate. Type of Study: Modified Barium Swallowing Study Reason for Referral: Objectively evaluate swallowing function Previous Swallow Assessment: BSE 01/06/13 Diet Prior to this Study: Dysphagia 1 (puree);Nectar-thick  liquids Temperature Spikes Noted: No Respiratory Status: Room air History of Recent Intubation: No Behavior/Cognition: Alert;Distractible;Requires cueing;Doesn't  follow directions;Decreased sustained  attention;Impulsive;Confused Oral Cavity - Dentition: Dentures, top;Dentures, bottom Oral Motor / Sensory Function: Within functional limits Self-Feeding Abilities: Total assist Patient Positioning: Upright in chair Baseline Vocal Quality: Clear Volitional Cough: Congested Volitional Swallow: Unable to elicit Anatomy: Within functional limits Pharyngeal Secretions: Not observed secondary MBS    Reason for Referral Objectively evaluate swallowing function   Oral Phase Oral  Preparation/Oral Phase Oral Phase: Impaired Oral - Honey Oral - Honey Teaspoon: Right anterior bolus loss;Weak lingual  manipulation;Incomplete tongue to palate contact;Reduced  posterior propulsion;Piecemeal swallowing;Delayed oral  transit;Left anterior bolus loss Oral - Nectar Oral - Nectar Teaspoon: Left anterior bolus loss;Right anterior  bolus loss;Weak lingual manipulation;Incomplete tongue to palate  contact;Piecemeal swallowing;Delayed oral transit Oral - Nectar Cup: Left anterior bolus loss;Right anterior bolus  loss;Weak lingual manipulation;Incomplete tongue to palate  contact;Piecemeal swallowing;Delayed oral transit Oral - Solids Oral - Puree: Left anterior bolus loss;Right anterior bolus  loss;Weak lingual manipulation;Incomplete tongue to palate  contact;Lingual/palatal residue;Piecemeal swallowing;Delayed oral  transit   Pharyngeal Phase Pharyngeal Phase Pharyngeal Phase: Impaired Pharyngeal - Honey Pharyngeal - Honey Teaspoon: Reduced pharyngeal  peristalsis;Reduced epiglottic inversion;Reduced anterior  laryngeal mobility;Reduced laryngeal elevation;Reduced tongue  base retraction;Penetration/Aspiration after swallow;Compensatory  strategies attempted (Comment);Pharyngeal residue - posterior  pharnyx (Pt. unable to f/c's for compensatory strategies.) Penetration/Aspiration details (honey teaspoon): Material enters  airway, remains ABOVE vocal cords then ejected out Pharyngeal - Nectar Pharyngeal - Nectar Cup: Reduced epiglottic inversion;Reduced  anterior laryngeal mobility;Reduced laryngeal elevation;Reduced  tongue base retraction;Penetration/Aspiration during  swallow;Pharyngeal residue - valleculae Penetration/Aspiration details (nectar cup): Material enters  airway, CONTACTS cords and not ejected out Pharyngeal - Solids Pharyngeal - Puree: Delayed swallow initiation;Premature spillage  to valleculae;Reduced pharyngeal peristalsis;Reduced epiglottic  inversion;Reduced anterior laryngeal  mobility;Reduced laryngeal  elevation;Reduced tongue base retraction;Pharyngeal residue -  valleculae (Double swallow difficult to obtain, but reduced  residue.)  Cervical Esophageal Phase  GO    Cervical Esophageal Phase Cervical Esophageal Phase: Vicente Masson T 01/15/2013, 12:34 PM     Assessment: 77 y.o. female with multiple small right ACA and MCA territory acute cerebral infarctions, likely embolic, as well as probable occlusion of left internal carotid artery. Patient's INR on Coumadin is therapeutic. Etiology for persistent stupor state is unclear, but possibly infectious in etiology. She had no significant metabolic abnormality.  Stroke Risk Factors - atrial fibrillation and hypertension  Plan: 1. HgbA1c, fasting lipid panel 2. MRA  of the brain without contrast 3. PT consult, OT consult, Speech consult 4. Carotid dopplers 6. Prophylactic therapy-Anticoagulation: Coumadin 7. Risk factor modification 8. Telemetry monitoring   C.R. Roseanne Reno, MD [facility] [phone]  01/16/2013, 5:54 AM

## 2013-01-16 NOTE — Progress Notes (Signed)
ANTICOAGULATION CONSULT NOTE - Initial Consult  Pharmacy Consult for Heparin Indication: hx atrial fibrillation, recent upper extremity DVT, and recent stroke  Allergies  Allergen Reactions  . Penicillins Anaphylaxis and Swelling  . Ambien [Zolpidem Tartrate]     tachycardia    . Aspirin Nausea And Vomiting  . Codeine Itching  . Tartrazine Nausea Only    Patient Measurements: Height: 5\' 6"  (167.6 cm) Weight: 146 lb (66.225 kg) IBW/kg (Calculated) : 59.3 Heparin Dosing Weight: 66 kg  Vital Signs: Temp: 98.1 F (36.7 C) (08/13 0920) Temp src: Axillary (08/13 0920) BP: 118/71 mmHg (08/13 0920) Pulse Rate: 107 (08/13 0920)  Labs:  Recent Labs  01/14/13 0656 01/14/13 1020  01/15/13 0615 01/15/13 0821 01/15/13 2035 01/16/13 0400  HGB 12.2  --   --   --  12.5  --  12.6  HCT 36.5  --   --   --  37.5  --  37.7  PLT 237  --   --   --  253  --  274  LABPROT 26.6* 24.7*  --  23.5*  --   --  20.1*  INR 2.55* 2.32*  --  2.17*  --   --  1.77*  CREATININE 0.54 0.54  < > 0.67 0.66 0.58 0.61  < > = values in this interval not displayed.  Estimated Creatinine Clearance: 56.9 ml/min (by C-G formula based on Cr of 0.61).   Medical History: Past Medical History  Diagnosis Date  . Hypertension   . Unspecified venous (peripheral) insufficiency   . Personal history of malignant neoplasm of large intestine   . Cancer     colon  . Varicose veins of lower extremities with other complications     Medications:  Scheduled:  . antiseptic oral rinse  15 mL Mouth Rinse q12n4p  . [START ON 01/17/2013] aspirin  300 mg Rectal Daily  . aztreonam  2 g Intravenous Q8H  . budesonide-formoterol  2 puff Inhalation BID  . chlorhexidine  15 mL Mouth Rinse BID  . donepezil  5 mg Oral QHS  . haloperidol lactate  1 mg Intravenous Once  . metoprolol  5 mg Intravenous Q6H  . vancomycin  750 mg Intravenous Q12H    Assessment: 77 yo F on Coumadin PTA for hx Afib and RUE DVT.  Noted stroke team  recommendations for IV heparin until PEG can be placed with plans to transition to Eliquis.  INR today = 1.77, last Coumadin dose 8/11.  Will not bolus heparin (and use lower dosing goal) due to recent suspected CVA and elevated INR.     Goal of Therapy:  Heparin level 0.3-0.5 units/ml Monitor platelets by anticoagulation protocol: Yes   Plan:  Heparin infusion at 800 units/hr. Heparin level 8 hours after infusion starts. Heparin level and CBC daily while on heparin. Will follow up plans for PEG and initiation of oral anticoagulant at that time.  [employer], Pharm.D., BCPS Clinical Pharmacist Pager [phone] 01/16/2013 6:36 PM

## 2013-01-16 NOTE — Progress Notes (Signed)
Stroke Team Progress Note  HISTORY Ashley Rubio is an 77 y.o. female history of atrial fibrillation on anticoagulation (2.55 on admit), hypertension and dementia who was admitted on 01/10/2013 for management of acute pneumonia. She's had persistent altered mental status with reduced level of responsiveness. MRI was obtained on 01/15/2013 which showed multiple small cerebral infarctions involving middle and posterior cerebral artery territories on the right. 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 This was thought to probably be chronic since patient had no signs of acute left cerebral infarction. Time of onset of infarctions is unclear.Marland Kitchen She was recently hospitalized for urinary tract infection and associated encephalopathy, but subsequently improved and was discharged.  Patient was not a TPA candidate secondary to therapeutic INR. She was admitted for further evaluation and treatment.  SUBJECTIVE At visit, she was lying in bed alone. Did not speak much.  OBJECTIVE Most recent Vital Signs: Filed Vitals:   01/15/13 1745 01/15/13 2043 01/16/13 0616 01/16/13 0920  BP: 154/118 147/86 128/72 118/71  Pulse: 108 82 112 107  Temp: 97.5 F (36.4 C) 98.1 F (36.7 C) 98.6 F (37 C) 98.1 F (36.7 C)  TempSrc:  Oral Axillary Axillary  Resp: 16 17 16 20   Height:  5\' 6"  (1.676 m)    Weight:  66.225 kg (146 lb)    SpO2: 92% 100% 98% 100%   CBG (last 3)  No results found for this basename: GLUCAP,  in the last 72 hours  IV Fluid Intake:   . sodium chloride Stopped (01/14/13 0603)  . feeding supplement (JEVITY 1.2 CAL)      MEDICATIONS  . antiseptic oral rinse  15 mL Mouth Rinse q12n4p  . aspirin  150 mg Rectal Daily  . aztreonam  2 g Intravenous Q8H  . budesonide-formoterol  2 puff Inhalation BID  . chlorhexidine  15 mL Mouth Rinse BID  . donepezil  5 mg Oral QHS  . haloperidol lactate  1 mg Intravenous Once  .  metoprolol  5 mg Intravenous Q6H  . vancomycin  750 mg Intravenous Q12H   PRN:  RESOURCE THICKENUP CLEAR  Diet:  NPO  Activity:  Bedrest with Bathroom privileges  DVT Prophylaxis:    CLINICALLY SIGNIFICANT STUDIES Basic Metabolic Panel:  Recent Labs Lab 01/15/13 0615  01/15/13 2035 01/16/13 0400  NA 140  < > 136 136  K 4.3  < > 4.0 3.9  CL 109  < > 105 104  CO2 24  < > 22 23  GLUCOSE 101*  < > 98 95  BUN 8  < > 8 10  CREATININE 0.67  < > 0.58 0.61  CALCIUM 8.8  < > 8.6 8.7  MG 1.4*  --   --  1.6  PHOS 2.8  --   --  3.0  < > = values in this interval not displayed. Liver Function Tests:  Recent Labs Lab 01/10/13 1524  AST 18  ALT 11  ALKPHOS 96  BILITOT 0.9  PROT 5.9*  ALBUMIN 2.3*   CBC:  Recent Labs Lab 01/15/13 0821 01/16/13 0400  WBC 6.8 10.9*  NEUTROABS 5.0 9.2*  HGB 12.5 12.6  HCT 37.5 37.7  MCV 91.0 90.2  PLT 253 274   Coagulation:  Recent Labs Lab 01/14/13 0656 01/14/13 1020 01/15/13 0615 01/16/13 0400  LABPROT 26.6* 24.7* 23.5* 20.1*  INR 2.55* 2.32* 2.17* 1.77*   Cardiac Enzymes:  Recent Labs Lab 01/10/13 1524  TROPONINI <  0.30   Urinalysis:  Recent Labs Lab 01/10/13 1720  COLORURINE AMBER*  LABSPEC 1.017  PHURINE 5.5  GLUCOSEU NEGATIVE  HGBUR SMALL*  BILIRUBINUR LARGE*  KETONESUR 40*  PROTEINUR 30*  UROBILINOGEN 1.0  NITRITE NEGATIVE  LEUKOCYTESUR MODERATE*   Lipid Panel    Component Value Date/Time   CHOL 155 01/16/2013 0400   TRIG 115 01/16/2013 0400   HDL 28* 01/16/2013 0400   CHOLHDL 5.5 01/16/2013 0400   VLDL 23 01/16/2013 0400   LDLCALC 104* 01/16/2013 0400   HgbA1C  Lab Results  Component Value Date   HGBA1C 5.6 01/16/2013    Urine Drug Screen:   No results found for this basename: labopia, cocainscrnur, labbenz, amphetmu, thcu, labbarb    Alcohol Level: No results found for this basename: ETH,  in the last 168 hours  Dg Chest 2 View 01/15/2013  :  1.  Cardiomegaly and pulmonary edema. 2.  Bilateral lower  lobe infiltrates, left greater than right. 3.  Bilateral small effusions.    Mr Brain Wo Contrast 01/16/2013  1. Small, scattered right cerebral hemispheric acute infarctions, both in the MCA and PCA distributions.  2. Occluded (more likely) or slowly flowing left cervical and cavernous ICA. In the absence of left-sided infarction, this is likely chronic.  3.  Advanced global brain atrophy.    Dg Swallowing Func-speech Pathology 01/15/2013   Lenor Derrick, CCC-SLP     01/15/2013 12:34 PM Objective Swallowing Evaluation: Modified Barium Swallowing Study   Patient Details  Name: Ashley Rubio MRN: [id number] Date of Birth: 02/17/1936  Today's Date: 01/15/2013 Time: 1100-1234 SLP Time Calculation (min): 94 min  Past Medical History:  Past Medical History  Diagnosis Date  . Hypertension   . Unspecified venous (peripheral) insufficiency   . Personal history of malignant neoplasm of large intestine   . Cancer     colon  . Varicose veins of lower extremities with other complications    Past Surgical History:  Past Surgical History  Procedure Laterality Date  . Neck surgery  2003  . Tongue biopsy  2003  . Abdominal hysterectomy    . Breast biopsy    . Colonoscopy    . Gastric perforation repair     . Cardioversion    . Leg surgery Left    HPI:  Mrs. Puthoff was recently discharged from our hospital for  encephalopathy due to presumed UTI that worsened her underlying  dementia. She was reported to becoming increasingly weak at home  with poor po intake. She was readmitted for dehydration, HCAP due  to LLL infiltrate.     Assessment / Plan / Recommendation Clinical Impression  Dysphagia Diagnosis: Moderate oral phase dysphagia;Severe oral  phase dysphagia Clinical impression: Pt. exhibits a moderate oral phase dysphagia  and severe pharyngeal dysphagia, characterized by weak lingual  movements, resulting in delayed oral transit and poor posterier  propulsion of the bolus orally and pharyngeally; decreased  laryngeal  elevation and hyoid excursion, and epiglottic  deflection during the swallow, resulting in penetration of both  nectar and honey consistencies;  and decreased base of tongue  contraction to the posterior pharyngeal wall and decreased  pharyngeal perestalsis, resulting in vallucular residue with  puree, with risk of aspiration after the swallow.  Pt. was unable  to f/c's for compensatory strategies, but finally was able to  complete a dry swallow which decreased the laryngeal stasis.   This required max verbal and tactile cues, and would not be  possible to  complete after every swallow.  Pt. is at risk for  aspiration with any/all possible consistencies at this time.    Treatment Recommendation  Defer treatment plan to SLP at (Comment) (Defer plan until GOC  determined)    Diet Recommendation NPO (Until MD talks with family)        Other  Recommendations     Follow Up Recommendations  Skilled Nursing facility;24 hour supervision/assistance    Frequency and Duration        Pertinent Vitals/Pain n/a    SLP Swallow Goals     General Date of Onset: 01/04/13 HPI: Mrs. Chui was recently discharged from our hospital for  encephalopathy due to presumed UTI that worsened her underlying  dementia. She was reported to becoming increasingly weak at home  with poor po intake. She was readmitted for dehydration, HCAP due  to LLL infiltrate. Type of Study: Modified Barium Swallowing Study Reason for Referral: Objectively evaluate swallowing function Previous Swallow Assessment: BSE 01/06/13 Diet Prior to this Study: Dysphagia 1 (puree);Nectar-thick  liquids Temperature Spikes Noted: No Respiratory Status: Room air History of Recent Intubation: No Behavior/Cognition: Alert;Distractible;Requires cueing;Doesn't  follow directions;Decreased sustained  attention;Impulsive;Confused Oral Cavity - Dentition: Dentures, top;Dentures, bottom Oral Motor / Sensory Function: Within functional limits Self-Feeding Abilities: Total assist Patient  Positioning: Upright in chair Baseline Vocal Quality: Clear Volitional Cough: Congested Volitional Swallow: Unable to elicit Anatomy: Within functional limits Pharyngeal Secretions: Not observed secondary MBS    Reason for Referral Objectively evaluate swallowing function   Oral Phase Oral Preparation/Oral Phase Oral Phase: Impaired Oral - Honey Oral - Honey Teaspoon: Right anterior bolus loss;Weak lingual  manipulation;Incomplete tongue to palate contact;Reduced  posterior propulsion;Piecemeal swallowing;Delayed oral  transit;Left anterior bolus loss Oral - Nectar Oral - Nectar Teaspoon: Left anterior bolus loss;Right anterior  bolus loss;Weak lingual manipulation;Incomplete tongue to palate  contact;Piecemeal swallowing;Delayed oral transit Oral - Nectar Cup: Left anterior bolus loss;Right anterior bolus  loss;Weak lingual manipulation;Incomplete tongue to palate  contact;Piecemeal swallowing;Delayed oral transit Oral - Solids Oral - Puree: Left anterior bolus loss;Right anterior bolus  loss;Weak lingual manipulation;Incomplete tongue to palate  contact;Lingual/palatal residue;Piecemeal swallowing;Delayed oral  transit   Pharyngeal Phase Pharyngeal Phase Pharyngeal Phase: Impaired Pharyngeal - Honey Pharyngeal - Honey Teaspoon: Reduced pharyngeal  peristalsis;Reduced epiglottic inversion;Reduced anterior  laryngeal mobility;Reduced laryngeal elevation;Reduced tongue  base retraction;Penetration/Aspiration after swallow;Compensatory  strategies attempted (Comment);Pharyngeal residue - posterior  pharnyx (Pt. unable to f/c's for compensatory strategies.) Penetration/Aspiration details (honey teaspoon): Material enters  airway, remains ABOVE vocal cords then ejected out Pharyngeal - Nectar Pharyngeal - Nectar Cup: Reduced epiglottic inversion;Reduced  anterior laryngeal mobility;Reduced laryngeal elevation;Reduced  tongue base retraction;Penetration/Aspiration during  swallow;Pharyngeal residue - valleculae  Penetration/Aspiration details (nectar cup): Material enters  airway, CONTACTS cords and not ejected out Pharyngeal - Solids Pharyngeal - Puree: Delayed swallow initiation;Premature spillage  to valleculae;Reduced pharyngeal peristalsis;Reduced epiglottic  inversion;Reduced anterior laryngeal mobility;Reduced laryngeal  elevation;Reduced tongue base retraction;Pharyngeal residue -  valleculae (Double swallow difficult to obtain, but reduced  residue.)  Cervical Esophageal Phase    GO    Cervical Esophageal Phase Cervical Esophageal Phase: Vicente Masson T 01/15/2013, 12:34 PM    CT of the brain    MRA of the brain    2D Echocardiogram    Carotid Doppler     EKG   RUE DOPPLER 01/07/2013 There is acute occlusive DVT noted in the right axillary and superficial thrombosis noted in the right basilic vein.  All other veins appear thrombus free  Therapy Recommendations   Physical Exam  Frail elderly lady not in distress. . Afebrile. Head is nontraumatic. Neck is supple without bruit.  . Cardiac exam no murmur or gallop. Lungs are clear to auscultation. Distal pulses are well felt.  Neurologic Exam : Patient is very sleepy she can barely opens eyes. Mumbles unintelligibly. Follows only simple midline commands. Has slight right gaze preference.Dolls eye movements are present. Pupils irregular reactive.  mild left lower facial weakness. Tongue is midline. Motor system exam has mild left hemiparesis but will withdraw left upper extremity to pain. Has greater movements in the left lower extremity. Moves right upper and lower extremity greater than left but will not hold up against gravity. Sensation, coordination and gait cannot be tested reliably.  ASSESSMENT Ms. Deetya Drouillard is a 77 y.o. female presenting with acute delirium in addition to chronic altered mental status. Imaging confirms a right hemispheric infarct. Infarct felt to be embolic secondary to unknown source.  On warfarin prior to  admission for UE DVT. Now on aspirin 325 mg rectally every day for secondary stroke prevention. Patient with resultant acute delirium. Work up underway.   Hyperlipidemia, LDL 104; goal is < 100 however patient has protein calorie malnutrition therefore would not treat lipids at this point. Patient going to get PEG probably in am for nutritional support.   Hypertension  History of colon cancer  Urinary tract infection and pneumonia, on antibiotics  Atrial fibrillation history  RUE DVT 01/07/2013: on coumadin PTA, 2.55 on admit  Hospital day # 6  TREATMENT/PLAN  Continue aspirin 300mg  rectally daily for secondary stroke prevention.  Patient is now off coumadin, therefore considered a failure as stroke has occurred. Patient has RUE DVT from 01/07/2013.  Patient remains NPO, aspiration risk. Has pneumonia.  Patients family has met with palliative care and social work. Possible transfer to SNF. Patient is DNR. Patient has apparently told staff that she wants aggressive medical care (I spoke to Dr. Andrey Campanile who was involved with patients treatment plans and palliative care meetings)  If patient is not made comfort care, can proceed with 2D echo/carotids as well. However, at this point, I do not think that this would change patient care management.  Currently INR < 2.0; patient had embolic event with therapeutic dose. Has arm DVT. Will need to start IV Heparin as aspirin alone will not be enough for this treatment (would be enough for stroke alone however). Stop aspirin. Start heparin. Once PEG can be placed, patient can be switched to eliqius 5mg  twice a day and stop heparin at that time.  This complex treatment plan has been discussed and established with Dr. Pearlean Brownie. I have spoken to treatment team and will attempt to reach the team again to go over plan as outlined.  Gwendolyn Lima. Manson Passey, [facility], MBA, MHA [facility] Pager: [phone] 01/16/2013 3:58 PM  I have personally obtained  a history, examined the patient, evaluated imaging results, and formulated the assessment and plan of care. I agree with the above. Delia Heady, MD

## 2013-01-16 NOTE — Progress Notes (Addendum)
NUTRITION FOLLOW UP  Intervention:   1. Once NG tube has been placed and placement confirmed: initiate Jevity 1.2 @ 20 ml/hr and advance by 10 ml Q 8 hr to a goal rate of 55 ml/hr. This will provide 1584 kcal, 73 gm protein, and 1065 ml free water. Slow advancement as pt is at increased risk for refeeding syndrome.  2. If no IVF will need 175 ml free water flush TID, for a total free water intake 1590 ml H20.  3. D/c Ensure pudding, pt is NPO.  4. D/c calorie count   Nutrition Dx:   Inadequate oral intake related to poor appetite as evidenced by pt and family report.  Goal:   Intake to meet >/=90% estimated nutrition needs. Unmet  Monitor:   PO intake, weight trends, labs  Assessment:   Pt failed MBS on 8/12and was made NPO. Per notes, family is agreeable to NG tube to see how pt tolerates tube feeding and a palliative care consult has been placed. RD consulted for initiation and management of enteral nutrition.  Pt has had a calorie count ongoing since 8/11. Only 2 meals documented, and only taking bites of meals. Did eat one whole Ensure Pudding cup (170 kcal and 4 grams of protein). Based on this, pt is not meeting nutrition needs. Calore Count d/c'd as pt is NPO and NG tube to be placed.    Height: Ht Readings from Last 1 Encounters:  01/15/13 5\' 6"  (1.676 m)    Weight Status:   Wt Readings from Last 1 Encounters:  01/15/13 146 lb (66.225 kg)  weight trending up, possibly related to fluid status   Re-estimated needs:  Kcal: 1550 - 1700  Protein: 65 - 75 grams  Fluid: 1.5 - 1.8 liters dailiy   Skin: Stage II L buttocks  Stage I sacrum  Skin tear L arm   Diet Order: NPO   Intake/Output Summary (Last 24 hours) at 01/16/13 0840 Last data filed at 01/15/13 1805  Gross per 24 hour  Intake     10 ml  Output      0 ml  Net     10 ml    Last BM: 8/9    Labs:   Recent Labs Lab 01/12/13 0420  01/15/13 0615 01/15/13 0821 01/15/13 2035 01/16/13 0400  NA 136  <  > 140 138 136 136  K 4.2  < > 4.3 4.2 4.0 3.9  CL 105  < > 109 108 105 104  CO2 21  < > 24 23 22 23   BUN 9  < > 8 8 8 10   CREATININE 0.51  < > 0.67 0.66 0.58 0.61  CALCIUM 8.1*  < > 8.8 8.5 8.6 8.7  MG 1.9  --  1.4*  --   --  1.6  PHOS 2.4  --  2.8  --   --  3.0  GLUCOSE 81  < > 101* 100* 98 95  < > = values in this interval not displayed.  CBG (last 3)  No results found for this basename: GLUCAP,  in the last 72 hours  Scheduled Meds: . antiseptic oral rinse  15 mL Mouth Rinse q12n4p  . aztreonam  2 g Intravenous Q8H  . budesonide-formoterol  2 puff Inhalation BID  . chlorhexidine  15 mL Mouth Rinse BID  . donepezil  5 mg Oral QHS  . feeding supplement  1 Container Oral TID BM  . haloperidol lactate  1 mg Intravenous Once  .  metoprolol  5 mg Intravenous Q6H  . vancomycin  750 mg Intravenous Q12H  . warfarin  3 mg Oral ONCE-1800  . Warfarin - Pharmacist Dosing Inpatient   Does not apply q1800    Continuous Infusions: . sodium chloride Stopped (01/14/13 0603)    Clarene Duke RD, LDN Pager [phone] After Hours pager [phone]

## 2013-01-16 NOTE — Progress Notes (Signed)
Speech Language Pathology Dysphagia Treatment Patient Details Name: Ashley Rubio MRN: [id number] DOB: [birth year] Today's Date: 01/16/2013 Time: 1191-4782 SLP Time Calculation (min): 24 min  Assessment / Plan / Recommendation Clinical Impression  Pt. alert, holding son's hand and talking with her 3 children all at b/s.  Noted results of MRI, positive for acute CVA's which are likely etiology of dysphagia and worsened mental status.  Family with questions re: prognosis for improvement of swallow function following CVA.  Typically, most pt's with dysphagia after stroke do improve and are able to resume po's.  Family understands that pt. also had dementia at baseline, but was living alone up to about 1 month ago.  If patient would tolerate a Panda tube for feeding, a repeat MBS in 5 days may reveal enough improvement in swallow function to allow initiation of po diet.  Palliative Care team will meet with the family to decide feeding options.  Pt. currently will not tolerate po's, as evidenced by her current aspiration pneumonia.     Diet Recommendation  Continue with Current Diet: NPO (Pending Palliative Care mtg.)    SLP Plan     Pertinent Vitals/Pain n/a   Swallowing Goals     General Temperature Spikes Noted: No Respiratory Status: Supplemental O2 delivered via (comment) Behavior/Cognition: Alert;Confused;Distractible;Requires cueing;Decreased sustained attention Oral Cavity - Dentition: Dentures, top;Dentures, bottom Patient Positioning: Partially reclined  Oral Cavity - Oral Hygiene Does patient have any of the following "at risk" factors?: Lips - dry, cracked Brush patient's teeth BID with toothbrush (using toothpaste with fluoride): Yes Patient is HIGH RISK - Oral Care Protocol followed (see row info): Yes Patient is AT RISK - Oral Care Protocol followed (see row info): Yes   Dysphagia Treatment Treatment focused on: Patient/family/caregiver education Family/Caregiver Educated:  2 daughters and son Treatment Methods/Modalities: Other (comment) (Education to options, prognosis for improvement ) Patient observed directly with PO's: No Reason PO's not observed:  (NPO due to aspiration, Pending palliative care mtg.)   GO     Maryjo Rochester T 01/16/2013, 11:49 AM

## 2013-01-16 NOTE — Progress Notes (Signed)
Patient ID: Dezeray Puccio, female   DOB: 05/03/1936, 77 y.o.   MRN: [id number]   Subjective: Last night Ms. Spillers was unable to take her warfarin by mouth. Later, she was sent to MRI to further investigate left-sided weakness. MRI showed small, scattered right cerebral hemispheric acute infarctions in the MCA and PCA distributions. The night team talked with Dr. Roseanne Reno and I reviewed his note this morning, including the assessment & plan. This morning, Ms. Wentzel was again somnolent with her family at bedside. They told me that she slept through the night and has remained somnolent since we saw her yesterday. The daughter tells me that she tried suction to remove the patient's oral secretions but was told to stop by nursing because Ms. Prude became tachycardic. On rounds, Dr. Drue Second explained the results of the MRI to the patient's family and answered their questions.   This afternoon, Dr. Andrey Campanile and I met with palliative care and the family for a goals of care conversation, at which time no decisions were made. We also learned that the family has another sister in [place] who the family would like to consult with. At approximately 16:00 we were called by Jannette Fogo, NP, who let us know that she spoke with the patient; Ms. Kirlin made it clear that she would like to proceed with PEG tube.  Objective: Vital signs in last 24 hours: Filed Vitals:   01/15/13 1745 01/15/13 2043 01/16/13 0616 01/16/13 0920  BP: 154/118 147/86 128/72 118/71  Pulse: 108 82 112 107  Temp: 97.5 F (36.4 C) 98.1 F (36.7 C) 98.6 F (37 C) 98.1 F (36.7 C)  TempSrc:  Oral Axillary Axillary  Resp: 16 17 16 20   Height:  5\' 6"  (1.676 m)    Weight:  66.225 kg (146 lb)    SpO2: 92% 100% 98% 100%   Weight change: -1.27 kg (-2 lb 12.8 oz)  Physical Exam  Constitutional: She appears lethargic and malnourished. She appears unhealthy.  HENT:  Head: Normocephalic and atraumatic.  Cardiovascular: Tachycardia  present.  Exam reveals distant heart sounds.   Pulmonary/Chest: No respiratory distress. She has rhonchi.  Abdominal: Soft. Bowel sounds are normal. She exhibits no distension.  Musculoskeletal:  Decreased strength in left upper extremity. Patient keeps right hand near mouth.  Neurological: She appears lethargic.    Lab Results: CBC    Component Value Date/Time   WBC 10.9* 01/16/2013 0400   RBC 4.18 01/16/2013 0400   HGB 12.6 01/16/2013 0400   HCT 37.7 01/16/2013 0400   PLT 274 01/16/2013 0400   MCV 90.2 01/16/2013 0400   MCH 30.1 01/16/2013 0400   MCHC 33.4 01/16/2013 0400   RDW 14.0 01/16/2013 0400   LYMPHSABS 0.7 01/16/2013 0400   MONOABS 0.8 01/16/2013 0400   EOSABS 0.1 01/16/2013 0400   BASOSABS 0.0 01/16/2013 0400   Differential: 85% neutrophils  CMP     Component Value Date/Time   NA 136 01/16/2013 0400   K 3.9 01/16/2013 0400   CL 104 01/16/2013 0400   CO2 23 01/16/2013 0400   GLUCOSE 95 01/16/2013 0400   BUN 10 01/16/2013 0400   CREATININE 0.61 01/16/2013 0400   CALCIUM 8.7 01/16/2013 0400   PROT 5.9* 01/10/2013 1524   ALBUMIN 2.3* 01/10/2013 1524   AST 18 01/10/2013 1524   ALT 11 01/10/2013 1524   ALKPHOS 96 01/10/2013 1524   BILITOT 0.9 01/10/2013 1524   GFRNONAA 87* 01/16/2013 0400   GFRAA >90 01/16/2013 0400   INR  Component Value Date/Time   INR 1.77* 01/16/2013 0400   INR 2.17* 01/15/2013 0615   INR 2.32* 01/14/2013 1020     Micro Results: Recent Results (from the past 240 hour(s))  URINE CULTURE     Status: None   Collection Time    01/10/13  5:20 PM      Result Value Range Status   Specimen Description URINE, CATHETERIZED   Final   Special Requests NONE   Final   Culture  Setup Time     Final   Value: 01/10/2013 19:10     Performed at [facility] Count     Final   Value: 75,000 COLONIES/ML     Performed at [facility]   Culture     Final   Value: ENTEROCOCCUS SPECIES     Performed at [facility]   Report Status 01/12/2013  FINAL   Final   Organism ID, Bacteria ENTEROCOCCUS SPECIES   Final  CULTURE, BLOOD (ROUTINE X 2)     Status: None   Collection Time    01/10/13  5:30 PM      Result Value Range Status   Specimen Description BLOOD ARM LEFT   Final   Special Requests BOTTLES DRAWN AEROBIC AND ANAEROBIC 10CC   Final   Culture  Setup Time     Final   Value: 01/10/2013 23:26     Performed at [facility]   Culture     Final   Value: NO GROWTH 5 DAYS     Performed at [facility]   Report Status 01/16/2013 FINAL   Final  CULTURE, BLOOD (ROUTINE X 2)     Status: None   Collection Time    01/10/13  5:39 PM      Result Value Range Status   Specimen Description BLOOD HAND LEFT   Final   Special Requests BOTTLES DRAWN AEROBIC ONLY 8CC   Final   Culture  Setup Time     Final   Value: 01/10/2013 23:26     Performed at [facility]   Culture     Final   Value: NO GROWTH 5 DAYS     Performed at [facility]   Report Status 01/16/2013 FINAL   Final   Studies/Results: Dg Chest 2 View  01/15/2013   *RADIOLOGY REPORT*  Clinical Data: Cough, coarse breath sounds.  Somnolence. Fatigue.  CHEST - 2 VIEW  Comparison: 01/10/2013  Findings: Heart is enlarged.  There is dense opacity at the left lung base, obscuring the left hemidiaphragm.  Patchy density at the right lung base has increased, consistent with infiltrate. Bilateral pleural effusions are noted.  There are interstitial changes of pulmonary edema.  IMPRESSION:  1.  Cardiomegaly and pulmonary edema. 2.  Bilateral lower lobe infiltrates, left greater than right. 3.  Bilateral small effusions.   Original Report Authenticated By: Norva Pavlov, M.D.   Mr Brain Wo Contrast  01/16/2013   *RADIOLOGY REPORT*  Clinical Data: Weakness and somnolence.  Rule out stroke.  MRI HEAD WITHOUT CONTRAST  Technique:  Multiplanar, multiecho pulse sequences of the brain and surrounding structures were obtained according to standard protocol without  intravenous contrast.  Comparison: Head CT 01/08/2011.  Findings:  Calvarium and upper cervical spine: No marrow signal abnormality. Degenerative disc and facet disease with C3-4 retrolisthesis and C4- 5 anterolisthesis.  Orbits: No significant findings.  Sinuses: Clear. Left mastoid effusion, subtotal.  Brain: Patchy acute infarctions  in the right caudate body and subcortical high insular region, extending into the right corona radiata.  There are small ( sub centimeter) cortical infarcts in the right parietal and occipital lobes.  Absent flow related signal loss within the left cervical and cavernous carotid, with reconstitution at the level of the A1 and M1 segments.  Global atrophy, without lobar predominance. Scattered cerebral white matter T2 hyperintense signal abnormalities, usually related to chronic small vessel ischemia.  Ex vacuo ventricular enlargement without hydrocephalus.  No acute hemorrhage or evidence of mass lesion.  Critical Value/emergent results were called by telephone at the time of interpretation on 01/16/2013 at 01:45 to Dr Claudell Kyle, who verbally acknowledged these results.  IMPRESSION:  1. Small, scattered right cerebral hemispheric acute infarctions, both in the MCA and PCA distributions.  2. Occluded (more likely) or slowly flowing left cervical and cavernous ICA. In the absence of left-sided infarction, this is likely chronic.  3.  Advanced global brain atrophy.   Original Report Authenticated By: Tiburcio Pea   Dg Swallowing Func-speech Pathology  01/15/2013   Lenor Derrick, CCC-SLP     01/15/2013 12:34 PM Objective Swallowing Evaluation: Modified Barium Swallowing Study   Patient Details  Name: Leler Brion MRN: [id number] Date of Birth: [birth year]  Today's Date: 01/15/2013 Time: 1100-1234 SLP Time Calculation (min): 94 min  Past Medical History:  Past Medical History  Diagnosis Date   Hypertension    Unspecified venous (peripheral) insufficiency    Personal history of malignant  neoplasm of large intestine    Cancer     colon   Varicose veins of lower extremities with other complications    Past Surgical History:  Past Surgical History  Procedure Laterality Date   Neck surgery  2003   Tongue biopsy  2003   Abdominal hysterectomy     Breast biopsy     Colonoscopy     Gastric perforation repair      Cardioversion     Leg surgery Left    HPI:  Mrs. Gurevich was recently discharged from our hospital for  encephalopathy due to presumed UTI that worsened her underlying  dementia. She was reported to becoming increasingly weak at home  with poor po intake. She was readmitted for dehydration, HCAP due  to LLL infiltrate.     Assessment / Plan / Recommendation Clinical Impression  Dysphagia Diagnosis: Moderate oral phase dysphagia;Severe oral  phase dysphagia Clinical impression: Pt. exhibits a moderate oral phase dysphagia  and severe pharyngeal dysphagia, characterized by weak lingual  movements, resulting in delayed oral transit and poor posterier  propulsion of the bolus orally and pharyngeally; decreased  laryngeal elevation and hyoid excursion, and epiglottic  deflection during the swallow, resulting in penetration of both  nectar and honey consistencies;  and decreased base of tongue  contraction to the posterior pharyngeal wall and decreased  pharyngeal perestalsis, resulting in vallucular residue with  puree, with risk of aspiration after the swallow.  Pt. was unable  to f/c's for compensatory strategies, but finally was able to  complete a dry swallow which decreased the laryngeal stasis.   This required max verbal and tactile cues, and would not be  possible to complete after every swallow.  Pt. is at risk for  aspiration with any/all possible consistencies at this time.    Treatment Recommendation  Defer treatment plan to SLP at (Comment) (Defer plan until GOC  determined)    Diet Recommendation NPO (Until MD talks with family)  Other  Recommendations     Follow Up  Recommendations  Skilled Nursing facility;24 hour supervision/assistance    Frequency and Duration        Pertinent Vitals/Pain n/a    SLP Swallow Goals     General Date of Onset: 01/04/13 HPI: Mrs. Araiza was recently discharged from our hospital for  encephalopathy due to presumed UTI that worsened her underlying  dementia. She was reported to becoming increasingly weak at home  with poor po intake. She was readmitted for dehydration, HCAP due  to LLL infiltrate. Type of Study: Modified Barium Swallowing Study Reason for Referral: Objectively evaluate swallowing function Previous Swallow Assessment: BSE 01/06/13 Diet Prior to this Study: Dysphagia 1 (puree);Nectar-thick  liquids Temperature Spikes Noted: No Respiratory Status: Room air History of Recent Intubation: No Behavior/Cognition: Alert;Distractible;Requires cueing;Doesn't  follow directions;Decreased sustained  attention;Impulsive;Confused Oral Cavity - Dentition: Dentures, top;Dentures, bottom Oral Motor / Sensory Function: Within functional limits Self-Feeding Abilities: Total assist Patient Positioning: Upright in chair Baseline Vocal Quality: Clear Volitional Cough: Congested Volitional Swallow: Unable to elicit Anatomy: Within functional limits Pharyngeal Secretions: Not observed secondary MBS    Reason for Referral Objectively evaluate swallowing function   Oral Phase Oral Preparation/Oral Phase Oral Phase: Impaired Oral - Honey Oral - Honey Teaspoon: Right anterior bolus loss;Weak lingual  manipulation;Incomplete tongue to palate contact;Reduced  posterior propulsion;Piecemeal swallowing;Delayed oral  transit;Left anterior bolus loss Oral - Nectar Oral - Nectar Teaspoon: Left anterior bolus loss;Right anterior  bolus loss;Weak lingual manipulation;Incomplete tongue to palate  contact;Piecemeal swallowing;Delayed oral transit Oral - Nectar Cup: Left anterior bolus loss;Right anterior bolus  loss;Weak lingual manipulation;Incomplete tongue to palate   contact;Piecemeal swallowing;Delayed oral transit Oral - Solids Oral - Puree: Left anterior bolus loss;Right anterior bolus  loss;Weak lingual manipulation;Incomplete tongue to palate  contact;Lingual/palatal residue;Piecemeal swallowing;Delayed oral  transit   Pharyngeal Phase Pharyngeal Phase Pharyngeal Phase: Impaired Pharyngeal - Honey Pharyngeal - Honey Teaspoon: Reduced pharyngeal  peristalsis;Reduced epiglottic inversion;Reduced anterior  laryngeal mobility;Reduced laryngeal elevation;Reduced tongue  base retraction;Penetration/Aspiration after swallow;Compensatory  strategies attempted (Comment);Pharyngeal residue - posterior  pharnyx (Pt. unable to f/c's for compensatory strategies.) Penetration/Aspiration details (honey teaspoon): Material enters  airway, remains ABOVE vocal cords then ejected out Pharyngeal - Nectar Pharyngeal - Nectar Cup: Reduced epiglottic inversion;Reduced  anterior laryngeal mobility;Reduced laryngeal elevation;Reduced  tongue base retraction;Penetration/Aspiration during  swallow;Pharyngeal residue - valleculae Penetration/Aspiration details (nectar cup): Material enters  airway, CONTACTS cords and not ejected out Pharyngeal - Solids Pharyngeal - Puree: Delayed swallow initiation;Premature spillage  to valleculae;Reduced pharyngeal peristalsis;Reduced epiglottic  inversion;Reduced anterior laryngeal mobility;Reduced laryngeal  elevation;Reduced tongue base retraction;Pharyngeal residue -  valleculae (Double swallow difficult to obtain, but reduced  residue.)  Cervical Esophageal Phase    GO    Cervical Esophageal Phase Cervical Esophageal Phase: Vicente Masson T 01/15/2013, 12:34 PM    Medications: I have reviewed the patient's current medications. Scheduled Meds:  antiseptic oral rinse  15 mL Mouth Rinse q12n4p   aztreonam  2 g Intravenous Q8H   budesonide-formoterol  2 puff Inhalation BID   chlorhexidine  15 mL Mouth Rinse BID   donepezil  5 mg Oral QHS    haloperidol lactate  1 mg Intravenous Once   metoprolol  5 mg Intravenous Q6H   vancomycin  750 mg Intravenous Q12H   Continuous Infusions:  sodium chloride Stopped (01/14/13 0603)   feeding supplement (JEVITY 1.2 CAL)     heparin     PRN  Meds:.RESOURCE THICKENUP CLEAR Assessment/Plan: Principal Problem:   HCAP (healthcare-associated pneumonia) Active Problems:   Atrial fibrillation   Hypertension   Urinary tract infection   Embolic stroke involving carotid artery  Ms. Kaleeyah Cuffie is a 77 year old lady with a PMHx of atrial fibrillation, hypertension, and dementia who presents with generalized weakness and left lower lobe HCAP.   **Stroke MRI of brain showed small, scattered right cerebral hemispheric acute infarctions, both in the MCA and PCA distributions as well as probable occlusion of the left internal carotid artery. Dr. Marca Ancona note was reviewed this AM. The infarcts do not explain the entire clinical picture however they account for the left sided weakness. The family is aware of the results of the MRI. -Continue PT -Continue anticoagulation regimen (rectal aspirin, IV heparin) -Carotid dopplers **HCAP  She still has coarse breath sounds on exam. CXR yesterday shows pulmonary edema, bilateral lower lobe infiltrates with left greater than right, and small bilateral effusions. She failed her barium swallow study yesterday. Etiology of pulmonary infiltrates could be pulmonary edema due to atrial fibrillation, IV fluids, or likely aspiration given her failed swallow study and the new infiltrate in the RLL. Her current antibiotic regimen should provide coverage if aspiration is the case. She is on day 6/7 of aztreonam and day 7/7 of vancomycin.  - NPO  - Continue aztreonam IV for HCAP (anaphylaxis to penicillin)  **Decreased Appetite/Malnourishment  We attended the goals of care meeting with palliative care today. The family was divided as to comfort care vs. placement of  NG/PEG. No decisions were made at that time. After the palliative care family meeting today, the patient told Jannette Fogo, NP, that she would like to try the PEG feeding tube.  -Arrange for placement of PEG tube -Consult nutrition for specific feeding formula -Monitor Magnesium, Potassium, and Phosphorous (refeeding syndrome) -NPO  -Monitor weight trends, labs  **Altered Mental Status  She has been alert to person only. She probably has undergone a worsening of her underlying dementia and will not return to her baseline of living independently, and family is aware of this.  - Haldol prn for agitation  **Atrial fibrillation/Tachycardia/Bradycardia  Patient missed warfarin dose last night due to swallowing difficulties. She is sub-thearpeutic today with an INR of 1.77. Neurology recommended rectal aspirin and pharmacy has suggested heparin.  - Rectal aspirin in place of po warfarin per neurology recommendations - IV heparin 100 units/mL at 8 mL/hr - Metoprolol IV 5 mg q 6 hrs. for rate control  **HTN  BP ranging (120s-150s)/(70s-110s)  -Holding lisinopril **Iatrogenic Hyperkalemia  Resolved.  -Monitor electrolytes and treat hyperkalemia if present  -Continue to hold lisinopril  **DVT PPX  -Heparin   This is a Psychologist, occupational Note.  The care of the patient was discussed with Dr. Andrey Campanile and the assessment and plan formulated with their assistance.  Please see their attached note for official documentation of the daily encounter.   LOS: 6 days   Merrie Roof, MS3 01/16/2013, 3:30 PM

## 2013-01-16 NOTE — Progress Notes (Addendum)
Thank you for consulting the Palliative Medicine Team at [facility] to meet your patient's and family's needs.   The reason that you asked [place] to see your patient is  For Goals of Care  We have scheduled your patient for a meeting: today at 2 pm  The Surrogate decision make is: children, son, Dorita Fray and Colan Neptune Contact information:[place] (ph: [phone]) Colan Neptune (ph: [phone]  Other family members that need to be present: as listed above  Your patient is unable to participate progressive dementia/limited verbal response and understanding  Additional Narrative:   Spoke briefly with family at bedside are in agreement with DNR/DNI status-there was no code status emphasized in chart. Order placed.  Patient is 77 yo PMH: dementia, A-fib (on coumadin) and HTN. Recentmultiple admissions over past few months. Admitted from home 8/7 due to weakness and decreased oral intake. Currently being treated for UTI, PNA, SLP evaluation indicates patient at high risk for aspiration.  Dr Andrey Campanile medical resident to attend meeting.   Freddie Breech, CNS-C Palliative Medicine Team [facility] Team Phone: [phone] Pager: [phone]

## 2013-01-16 NOTE — Progress Notes (Signed)
Chaplain Note:  Met with children of pt. Per their request.  I met son Juanell Fairly, Daughter Kendal Hymen and son's wife, as well as patient. The children told me that their mother had a stroke and has been admitted to the hospital multiple times over the last month. They indicated that they would be meeting with the palliative care team soon to discuss options for care and expressed how difficult it was to know what was the right decision for their mother. Their request of me was to have a prayer for them to be able to discern what was best for their mother to be able to have peace about it. They expressed their belief/faith that God would help them and the request for a prayer seemed consistent with this request. The palliative care nurse came in shortly and I transitioned conversation to her.  I told them I would come back to see them and they were receptive to this offer.   Dyke Maes. [phone]

## 2013-01-16 NOTE — Consult Note (Signed)
Patient ZO:XWRUEAV Savitt      DOB: 08-03-1935      WUJ:[id number]     Consult Note from the Palliative Medicine Team at [facility]    Consult Requested by: Evelena Peat     PCP: Luna Fuse, MD Reason for Consultation:Goals of care    Phone [phone]  Assessment of patients Current state: Patient appears tired, does admit to generalized weakness. She was able to be alert enough to answer appropriately pertinent questions regarding Goals of Care. She stated she understood what a gastric feeding tube was ,and that she would want one for defined period of time.  Reviewed chart, spoke with staff and proceeded to have palliative care meeting with family away from bedside at their request, and then approached patient. I met with patients son, Juanell Fairly, and daughters, Marylene Land, and Kendal Hymen. Dr Evelena Peat also in attendance for part of discussion.We discussed patients overall decline in mental and functional status over past 2 months, in context of current issues (newly found cerebral infarcts, progressive dementia with dysphagia). Family also states patient has lost around 25-30 pounds over last 2 months.   I then approached patient at family request about her current medical issues of not being able to safely swallow, and if she would want a g-tube inserted for nutritional purposes. She clearly understood and stated she would.  Discussed the philosophy of palliative care and hospice support and services provided. Also engaged in decision-making with patient/family regarding concepts of Medical Orders for Scope of Treatment pertaining to cardiac and pulmonary resuscitation, desire for acute future medical interventions for issues, the use of antibiotic therapy, intravenous hydration. A MOST form was placed on chart with DNR/DNI form.  Dr Andrey Campanile made aware of patient and families decision to proceed with feeding tube and also for request for PT/OT evaluation   Goals of Care: 1.   Code Status:DNR/DNI   2. Scope of Treatment: 1. Vital Signs:routine 2. Respiratory/Oxygen: support as indicated  3. Nutritional Support/Tube Feeds: Desires g-tube feeding tube for defined period of time 4. Antibiotics: as indicated 5. Blood Products: as indicated 6. IVF: as indicated for defined period of time 7. Labs: continue as indicated 8. Telemetry: as indicated  4. Disposition: Recommend disposition to SNF with [facility]-> Palliative Services to follow. Family are interested in SNF in [place]   3. Symptom Management:  No symptom management needs assessed   1. Dysphagia: currently NPO  4. Psychosocial: Patient lived with son, Juanell Fairly prior to this admission, family very supportive and involved in patients decisions and care. Per family patient was able to care for self and all ADL's, she lived alone in own home until 2 months ago.  5. Spiritual:already involved   Patient Documents Completed or Given: Document Given Completed  Advanced Directives Pkt    MOST  Yes  DNR  Yes  Gone from My Sight    Hard Choices Yes     Brief HPI: Patient is 77 yo PMH: dementia, A-fib (on coumadin) and HTN. Recentmultiple admissions over past few months. Admitted from home 8/7 due to weakness and decreased oral intake. Currently being treated for UTI, PNA, SLP evaluation indicates patient at high risk for aspiration.   ROS: denies pain, nausea, anxiety, constipation    PMH:  Past Medical History  Diagnosis Date  . Hypertension   . Unspecified venous (peripheral) insufficiency   . Personal history of malignant neoplasm of large intestine   . Cancer     colon  .  Varicose veins of lower extremities with other complications      PSH: Past Surgical History  Procedure Laterality Date  . Neck surgery  2003  . Tongue biopsy  2003  . Abdominal hysterectomy    . Breast biopsy    . Colonoscopy    . Gastric perforation repair     . Cardioversion    . Leg surgery  Left    I have reviewed the FH and SH and  If appropriate update it with new information. Allergies  Allergen Reactions  . Penicillins Anaphylaxis and Swelling  . Ambien [Zolpidem Tartrate]     tachycardia    . Aspirin Nausea And Vomiting  . Codeine Itching  . Tartrazine Nausea Only   Scheduled Meds: . antiseptic oral rinse  15 mL Mouth Rinse q12n4p  . aztreonam  2 g Intravenous Q8H  . budesonide-formoterol  2 puff Inhalation BID  . chlorhexidine  15 mL Mouth Rinse BID  . donepezil  5 mg Oral QHS  . haloperidol lactate  1 mg Intravenous Once  . metoprolol  5 mg Intravenous Q6H  . vancomycin  750 mg Intravenous Q12H   Continuous Infusions: . sodium chloride Stopped (01/14/13 0603)  . feeding supplement (JEVITY 1.2 CAL)    . heparin     PRN Meds:.RESOURCE THICKENUP CLEAR    BP 129/75  Pulse 123  Temp(Src) 98 F (36.7 C) (Axillary)  Resp 20  Ht 5\' 6"  (1.676 m)  Wt 66.225 kg (146 lb)  BMI 23.58 kg/m2  SpO2 100%   PPS:20%   Intake/Output Summary (Last 24 hours) at 01/16/13 1921 Last data filed at 01/16/13 1702  Gross per 24 hour  Intake    900 ml  Output      0 ml  Net    900 ml   LBM:8/10                        Physical Exam:  General: Appears comfortable, responds appropriately to simple questions HEENT: buccal mucosa dry Chest: poor air flow, fine rhonchi present CVS: irregular Abdomen:soft, BS audible Ext:no pedal edema  Neuro: recognizes family, verbally responsive to simple questions  Labs: CBC    Component Value Date/Time   WBC 10.9* 01/16/2013 0400   RBC 4.18 01/16/2013 0400   HGB 12.6 01/16/2013 0400   HCT 37.7 01/16/2013 0400   PLT 274 01/16/2013 0400   MCV 90.2 01/16/2013 0400   MCH 30.1 01/16/2013 0400   MCHC 33.4 01/16/2013 0400   RDW 14.0 01/16/2013 0400   LYMPHSABS 0.7 01/16/2013 0400   MONOABS 0.8 01/16/2013 0400   EOSABS 0.1 01/16/2013 0400   BASOSABS 0.0 01/16/2013 0400    BMET    Component Value Date/Time   NA 136 01/16/2013 0400    K 3.9 01/16/2013 0400   CL 104 01/16/2013 0400   CO2 23 01/16/2013 0400   GLUCOSE 95 01/16/2013 0400   BUN 10 01/16/2013 0400   CREATININE 0.61 01/16/2013 0400   CALCIUM 8.7 01/16/2013 0400   GFRNONAA 87* 01/16/2013 0400   GFRAA >90 01/16/2013 0400    CMP     Component Value Date/Time   NA 136 01/16/2013 0400   K 3.9 01/16/2013 0400   CL 104 01/16/2013 0400   CO2 23 01/16/2013 0400   GLUCOSE 95 01/16/2013 0400   BUN 10 01/16/2013 0400   CREATININE 0.61 01/16/2013 0400   CALCIUM 8.7 01/16/2013 0400   PROT 5.9* 01/10/2013 1524  ALBUMIN 2.3* 01/10/2013 1524   AST 18 01/10/2013 1524   ALT 11 01/10/2013 1524   ALKPHOS 96 01/10/2013 1524   BILITOT 0.9 01/10/2013 1524   GFRNONAA 87* 01/16/2013 0400   GFRAA >90 01/16/2013 0400    Chest Xray Reviewed/Impressions:01/15/13 IMPRESSION:  1. Cardiomegaly and pulmonary edema.  2. Bilateral lower lobe infiltrates, left greater than right.  3. Bilateral small effusions.    CT scan of the Head Reviewed/Impressions:01/15/13  IMPRESSION:  1. Small, scattered right cerebral hemispheric acute infarctions,  both in the MCA and PCA distributions.  2. Occluded (more likely) or slowly flowing left cervical and  cavernous ICA. In the absence of left-sided infarction, this is  likely chronic.  3. Advanced global brain atrophy.  Time In Time Out Total Time Spent with Patient Total Overall Time  2:00p 4:00p 120 min 120 min    Greater than 50%  of this time was spent counseling and coordinating care related to the above assessment and plan.  Freddie Breech, CNS-C Palliative Medicine Team [facility] Team Phone: [phone] Pager: [phone]

## 2013-01-16 NOTE — Progress Notes (Signed)
  Date: 01/16/2013  Patient name: Clella Mckeel  Medical record number: [id number]  Date of birth: 11-26-1935   This patient has been seen and the plan of care was discussed with the house staff. Please see their note for complete details. I concur with their findings with the following additions/corrections:  Patient was found to have new multiple small cerebral infarct on MRI yesterday in addition to swallow studies showing that she is unsafe for oral intake. Timing of onset of infarctation was difficult to assess since she was solomnent yesterday morning with little ability to follow commands. Due to her malnourished state, deconditioning, worsening dementia,she is felt to have poor prognosis. We have spent 30 min with the family discussing goals of care. We will get palliative care consultation to help family decide goals of care for Mrs. Mccarthy and decide if we will pursue peg tube palcement.  Judyann Munson, MD 01/16/2013, 10:40 PM

## 2013-01-16 NOTE — Progress Notes (Signed)
Clinical Social Work Department CLINICAL SOCIAL WORK PLACEMENT NOTE 01/16/2013  Patient:  JENNYE, RUNQUIST  Account Number:  [id number] Admit date:  01/10/2013  Clinical Social Worker:  Robin Searing  Date/time:  01/16/2013 02:29 PM  Clinical Social Work is seeking post-discharge placement for this patient at the following level of care:   SKILLED NURSING   (*CSW will update this form in Epic as items are completed)   01/16/2013  Patient/family provided with [facility] Department of Clinical Social Work's list of facilities offering this level of care within the geographic area requested by the patient (or if unable, by the patient's family).  01/16/2013  Patient/family informed of their freedom to choose among providers that offer the needed level of care, that participate in Medicare, Medicaid or managed care program needed by the patient, have an available bed and are willing to accept the patient.  01/16/2013  Patient/family informed of [facility]' ownership interest in [facility], as well as of the fact that they are under no obligation to receive care at this facility.  PASARR submitted to EDS on 01/16/2013 PASARR number received from EDS on 01/16/2013  FL2 transmitted to all facilities in geographic area requested by pt/family on  01/16/2013 FL2 transmitted to all facilities within larger geographic area on   Patient informed that his/her managed care company has contracts with or will negotiate with  certain facilities, including the following:     Patient/family informed of bed offers received:   Patient chooses bed at  Physician recommends and patient chooses bed at    Patient to be transferred to  on   Patient to be transferred to facility by   The following physician request were entered in Epic:   Additional Comments: Reece Levy, MSW [phone]

## 2013-01-17 ENCOUNTER — Inpatient Hospital Stay (HOSPITAL_COMMUNITY): Payer: Medicare Other

## 2013-01-17 ENCOUNTER — Encounter (HOSPITAL_COMMUNITY): Payer: Self-pay | Admitting: Radiology

## 2013-01-17 LAB — PROTIME-INR
INR: 1.9 — ABNORMAL HIGH (ref 0.00–1.49)
Prothrombin Time: 21.2 seconds — ABNORMAL HIGH (ref 11.6–15.2)

## 2013-01-17 LAB — BASIC METABOLIC PANEL
Calcium: 8.4 mg/dL (ref 8.4–10.5)
Creatinine, Ser: 0.66 mg/dL (ref 0.50–1.10)
GFR calc non Af Amer: 84 mL/min — ABNORMAL LOW (ref >90)
Glucose, Bld: 86 mg/dL (ref 70–99)
Sodium: 135 mEq/L (ref 135–145)

## 2013-01-17 LAB — CBC
HCT: 36.6 % (ref 36.0–46.0)
Hemoglobin: 12.4 g/dL (ref 12.0–15.0)
MCH: 30.5 pg (ref 26.0–34.0)
MCHC: 33.9 g/dL (ref 30.0–36.0)
MCV: 89.9 fL (ref 78.0–100.0)
RBC: 4.07 MIL/uL (ref 3.87–5.11)

## 2013-01-17 LAB — GLUCOSE, CAPILLARY
Glucose-Capillary: 89 mg/dL (ref 70–99)
Glucose-Capillary: 67 mg/dL — ABNORMAL LOW (ref 70–99)
Glucose-Capillary: 104 mg/dL — ABNORMAL HIGH (ref 70–99)

## 2013-01-17 LAB — PHOSPHORUS: Phosphorus: 2.6 mg/dL (ref 2.3–4.6)

## 2013-01-17 MED ORDER — SIMVASTATIN 20 MG PO TABS
20.0000 mg | ORAL_TABLET | Freq: Every day | ORAL | Status: DC
  Administered 2013-01-18: 20 mg via ORAL
  Filled 2013-01-17 (×3): qty 1

## 2013-01-17 MED ORDER — MAGNESIUM SULFATE 40 MG/ML IJ SOLN
2.0000 g | Freq: Once | INTRAMUSCULAR | Status: AC
  Administered 2013-01-17: 2 g via INTRAVENOUS
  Filled 2013-01-17: qty 50

## 2013-01-17 MED ORDER — DEXTROSE 50 % IV SOLN
INTRAVENOUS | Status: AC
  Administered 2013-01-17: 50 mL
  Filled 2013-01-17: qty 50

## 2013-01-17 NOTE — Progress Notes (Signed)
Stroke Team Progress Note  HISTORY Ashley Rubio is an 77 y.o. female history of atrial fibrillation on anticoagulation (2.55 on admit), hypertension and dementia who was admitted on 01/10/2013 for management of acute pneumonia. She's had persistent altered mental status with reduced level of responsiveness. MRI was obtained on 01/15/2013 which showed multiple small cerebral infarctions involving middle and posterior cerebral artery territories on the right. 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 This was thought to probably be chronic since patient had no signs of acute left cerebral infarction. Time of onset of infarctions is unclear.Marland Kitchen She was recently hospitalized for urinary tract infection and associated encephalopathy, but subsequently improved and was discharged.  Patient was not a TPA candidate secondary to therapeutic INR. She was admitted for further evaluation and treatment.  SUBJECTIVE Daughter at bedside. More awake today.  OBJECTIVE Most recent Vital Signs: Filed Vitals:   01/16/13 2013 01/16/13 2124 01/17/13 0022 01/17/13 0547  BP:  114/76 126/78 140/87  Pulse:  64 68 67  Temp:  97.6 F (36.4 C)  97.6 F (36.4 C)  TempSrc:  Oral  Oral  Resp:  20  16  Height:      Weight:  60.691 kg (133 lb 12.8 oz)    SpO2: 99% 92%  95%   CBG (last 3)   Recent Labs  01/17/13 0743  GLUCAP 89    IV Fluid Intake:   . sodium chloride 75 mL/hr at 01/17/13 0022  . feeding supplement (JEVITY 1.2 CAL)    . heparin 1,000 Units/hr (01/17/13 4540)    MEDICATIONS  . antiseptic oral rinse  15 mL Mouth Rinse q12n4p  . aztreonam  2 g Intravenous Q8H  . budesonide-formoterol  2 puff Inhalation BID  . chlorhexidine  15 mL Mouth Rinse BID  . donepezil  5 mg Oral QHS  . haloperidol lactate  1 mg Intravenous Once  . metoprolol  5 mg Intravenous Q6H  . simvastatin  20 mg Oral q1800  . vancomycin  750 mg Intravenous  Q12H   PRN:  RESOURCE THICKENUP CLEAR  Diet:  NPO  Activity:  Bedrest with Bathroom privileges  DVT Prophylaxis:    CLINICALLY SIGNIFICANT STUDIES Basic Metabolic Panel:   Recent Labs Lab 01/16/13 0400 01/17/13 0420  NA 136 135  K 3.9 3.7  CL 104 104  CO2 23 22  GLUCOSE 95 86  BUN 10 13  CREATININE 0.61 0.66  CALCIUM 8.7 8.4  MG 1.6 1.4*  PHOS 3.0 2.6   Liver Function Tests:   Recent Labs Lab 01/10/13 1524  AST 18  ALT 11  ALKPHOS 96  BILITOT 0.9  PROT 5.9*  ALBUMIN 2.3*   CBC:   Recent Labs Lab 01/15/13 0821 01/16/13 0400 01/17/13 0420  WBC 6.8 10.9* 8.3  NEUTROABS 5.0 9.2*  --   HGB 12.5 12.6 12.4  HCT 37.5 37.7 36.6  MCV 91.0 90.2 89.9  PLT 253 274 249   Coagulation:   Recent Labs Lab 01/14/13 1020 01/15/13 0615 01/16/13 0400 01/17/13 0420  LABPROT 24.7* 23.5* 20.1* 21.2*  INR 2.32* 2.17* 1.77* 1.90*   Cardiac Enzymes:   Recent Labs Lab 01/10/13 1524  TROPONINI <0.30   Urinalysis:   Recent Labs Lab 01/10/13 1720  COLORURINE AMBER*  LABSPEC 1.017  PHURINE 5.5  GLUCOSEU NEGATIVE  HGBUR SMALL*  BILIRUBINUR LARGE*  KETONESUR 40*  PROTEINUR 30*  UROBILINOGEN 1.0  NITRITE NEGATIVE  LEUKOCYTESUR MODERATE*  Lipid Panel    Component Value Date/Time   CHOL 155 01/16/2013 0400   TRIG 115 01/16/2013 0400   HDL 28* 01/16/2013 0400   CHOLHDL 5.5 01/16/2013 0400   VLDL 23 01/16/2013 0400   LDLCALC 104* 01/16/2013 0400   HgbA1C  Lab Results  Component Value Date   HGBA1C 5.6 01/16/2013    Urine Drug Screen:   No results found for this basename: labopia,  cocainscrnur,  labbenz,  amphetmu,  thcu,  labbarb    Alcohol Level: No results found for this basename: ETH,  in the last 168 hours  Dg Chest 2 View 01/15/2013  :  1.  Cardiomegaly and pulmonary edema. 2.  Bilateral lower lobe infiltrates, left greater than right. 3.  Bilateral small effusions.    Mr Brain Wo Contrast 01/16/2013  1. Small, scattered right cerebral hemispheric  acute infarctions, both in the MCA and PCA distributions.  2. Occluded (more likely) or slowly flowing left cervical and cavernous ICA. In the absence of left-sided infarction, this is likely chronic.  3.  Advanced global brain atrophy.    Dg Swallowing Func-speech Pathology 01/15/2013   Ashley Rubio, CCC-SLP     01/15/2013 12:34 PM Objective Swallowing Evaluation: Modified Barium Swallowing Study   Patient Details  Name: Ashley Rubio MRN: [id number] Date of Birth: 02/15/1936  Today's Date: 01/15/2013 Time: 1100-1234 SLP Time Calculation (min): 94 min  Past Medical History:  Past Medical History  Diagnosis Date  . Hypertension   . Unspecified venous (peripheral) insufficiency   . Personal history of malignant neoplasm of large intestine   . Cancer     colon  . Varicose veins of lower extremities with other complications    Past Surgical History:  Past Surgical History  Procedure Laterality Date  . Neck surgery  2003  . Tongue biopsy  2003  . Abdominal hysterectomy    . Breast biopsy    . Colonoscopy    . Gastric perforation repair     . Cardioversion    . Leg surgery Left    HPI:  Ashley Rubio was recently discharged from our hospital for  encephalopathy due to presumed UTI that worsened her underlying  dementia. She was reported to becoming increasingly weak at home  with poor po intake. She was readmitted for dehydration, HCAP due  to LLL infiltrate.     Assessment / Plan / Recommendation Clinical Impression  Dysphagia Diagnosis: Moderate oral phase dysphagia;Severe oral  phase dysphagia Clinical impression: Pt. exhibits a moderate oral phase dysphagia  and severe pharyngeal dysphagia, characterized by weak lingual  movements, resulting in delayed oral transit and poor posterier  propulsion of the bolus orally and pharyngeally; decreased  laryngeal elevation and hyoid excursion, and epiglottic  deflection during the swallow, resulting in penetration of both  nectar and honey consistencies;  and decreased base of  tongue  contraction to the posterior pharyngeal wall and decreased  pharyngeal perestalsis, resulting in vallucular residue with  puree, with risk of aspiration after the swallow.  Pt. was unable  to f/c's for compensatory strategies, but finally was able to  complete a dry swallow which decreased the laryngeal stasis.   This required max verbal and tactile cues, and would not be  possible to complete after every swallow.  Pt. is at risk for  aspiration with any/all possible consistencies at this time.    Treatment Recommendation  Defer treatment plan to SLP at (Comment) (Defer plan until GOC  determined)  Diet Recommendation NPO (Until MD talks with family)        Other  Recommendations     Follow Up Recommendations  Skilled Nursing facility;24 hour supervision/assistance    Frequency and Duration        Pertinent Vitals/Pain n/a    SLP Swallow Goals     General Date of Onset: 01/04/13 HPI: Ashley Rubio was recently discharged from our hospital for  encephalopathy due to presumed UTI that worsened her underlying  dementia. She was reported to becoming increasingly weak at home  with poor po intake. She was readmitted for dehydration, HCAP due  to LLL infiltrate. Type of Study: Modified Barium Swallowing Study Reason for Referral: Objectively evaluate swallowing function Previous Swallow Assessment: BSE 01/06/13 Diet Prior to this Study: Dysphagia 1 (puree);Nectar-thick  liquids Temperature Spikes Noted: No Respiratory Status: Room air History of Recent Intubation: No Behavior/Cognition: Alert;Distractible;Requires cueing;Doesn'Rubio  follow directions;Decreased sustained  attention;Impulsive;Confused Oral Cavity - Dentition: Dentures, top;Dentures, bottom Oral Motor / Sensory Function: Within functional limits Self-Feeding Abilities: Total assist Patient Positioning: Upright in chair Baseline Vocal Quality: Clear Volitional Cough: Congested Volitional Swallow: Unable to elicit Anatomy: Within functional limits  Pharyngeal Secretions: Not observed secondary MBS    Reason for Referral Objectively evaluate swallowing function   Oral Phase Oral Preparation/Oral Phase Oral Phase: Impaired Oral - Honey Oral - Honey Teaspoon: Right anterior bolus loss;Weak lingual  manipulation;Incomplete tongue to palate contact;Reduced  posterior propulsion;Piecemeal swallowing;Delayed oral  transit;Left anterior bolus loss Oral - Nectar Oral - Nectar Teaspoon: Left anterior bolus loss;Right anterior  bolus loss;Weak lingual manipulation;Incomplete tongue to palate  contact;Piecemeal swallowing;Delayed oral transit Oral - Nectar Cup: Left anterior bolus loss;Right anterior bolus  loss;Weak lingual manipulation;Incomplete tongue to palate  contact;Piecemeal swallowing;Delayed oral transit Oral - Solids Oral - Puree: Left anterior bolus loss;Right anterior bolus  loss;Weak lingual manipulation;Incomplete tongue to palate  contact;Lingual/palatal residue;Piecemeal swallowing;Delayed oral  transit   Pharyngeal Phase Pharyngeal Phase Pharyngeal Phase: Impaired Pharyngeal - Honey Pharyngeal - Honey Teaspoon: Reduced pharyngeal  peristalsis;Reduced epiglottic inversion;Reduced anterior  laryngeal mobility;Reduced laryngeal elevation;Reduced tongue  base retraction;Penetration/Aspiration after swallow;Compensatory  strategies attempted (Comment);Pharyngeal residue - posterior  pharnyx (Pt. unable to f/c's for compensatory strategies.) Penetration/Aspiration details (honey teaspoon): Material enters  airway, remains ABOVE vocal cords then ejected out Pharyngeal - Nectar Pharyngeal - Nectar Cup: Reduced epiglottic inversion;Reduced  anterior laryngeal mobility;Reduced laryngeal elevation;Reduced  tongue base retraction;Penetration/Aspiration during  swallow;Pharyngeal residue - valleculae Penetration/Aspiration details (nectar cup): Material enters  airway, CONTACTS cords and not ejected out Pharyngeal - Solids Pharyngeal - Puree: Delayed swallow  initiation;Premature spillage  to valleculae;Reduced pharyngeal peristalsis;Reduced epiglottic  inversion;Reduced anterior laryngeal mobility;Reduced laryngeal  elevation;Reduced tongue base retraction;Pharyngeal residue -  valleculae (Double swallow difficult to obtain, but reduced  residue.)  Cervical Esophageal Phase    GO    Cervical Esophageal Phase Cervical Esophageal Phase: Ashley Rubio 01/15/2013, 12:34 PM    CT of the brain    MRA of the brain    2D Echocardiogram  EF 50%, left atrium severely dilated. Afib.  Carotid Doppler     EKG   RUE DOPPLER 01/07/2013 There is acute occlusive DVT noted in the right axillary and superficial thrombosis noted in the right basilic vein. All other veins appear thrombus free  Therapy Recommendations   Physical Exam  Frail elderly lady not in distress. . Afebrile. Head is nontraumatic. Neck is supple without bruit.  . Cardiac exam no  murmur or gallop. Lungs are clear to auscultation. Distal pulses are well felt.  Neurologic Exam : Patient is awake. Mumbles unintelligibly. Follows only simple midline commands. Has slight right gaze preference.Dolls eye movements are present. Pupils irregular reactive.  mild left lower facial weakness. Tongue is midline. Motor system exam has moderate left hemiparesis but will withdraw left upper extremity to pain. Has greater movements in the left lower extremity. Moves right upper and lower extremity greater than left but will not hold up against gravity. Sensation, coordination and gait cannot be tested reliably.  ASSESSMENT Ashley Rubio is a 77 y.o. female presenting with acute delirium in addition to chronic altered mental status. Imaging confirms a right hemispheric infarct. Infarct felt to be embolic secondary to unknown source.  On warfarin prior to admission for UE DVT. Now on aspirin 325 mg rectally every day for secondary stroke prevention. Patient with resultant acute delirium. Work up  underway.   Hyperlipidemia, LDL 104; goal is < 100 however patient has protein calorie malnutrition therefore would not treat lipids at this point. Patient going to get PEG probably in am for nutritional support.   Hypertension  History of colon cancer  Urinary tract infection and pneumonia, on antibiotics  Atrial fibrillation history  RUE DVT 01/07/2013: on coumadin PTA, 2.55 on admit  Hospital day # 7  TREATMENT/PLAN  Continue aspirin 300mg  rectally daily for secondary stroke prevention.  Patient is now off coumadin, therefore considered a failure as stroke has occurred. Patient has RUE DVT from 01/07/2013. Today INR is 1.9  Patient remains NPO, aspiration risk. Has pneumonia.  Patients and family has met with palliative care and social work. Patient wants full medical care  Due to this, will pursue aggressive workup. Start zocor, check lipids/echo.  However; will need tube feeds until PEG can be placed with appropriate INR.  Patient had embolic event with therapeutic dose of 2.55. She was diagnosed with arm DVT 01/07/2013. Continue IV Heparin as aspirin alone will not be enough for this treatment (would be enough for stroke alone however). Stop aspirin. Start heparin. Once PEG can be placed, patient can be switched to eliqius 5mg  twice a day and stop heparin at that time.  Dr. Pearlean Brownie has discussed this complex treatment plan with patient and daughter.   Gwendolyn Lima. Manson Passey, [facility], MBA, MHA [facility] Pager: [phone] 01/17/2013 9:21 AM  I have personally obtained a history, examined the patient, evaluated imaging results, and formulated the assessment and plan of care. I agree with the above.  Delia Heady, MD

## 2013-01-17 NOTE — Progress Notes (Signed)
Speech Language Pathology Dysphagia Treatment Patient Details Name: Ashley Rubio MRN: [id number] DOB: October 01, 1935 Today's Date: 01/17/2013 Time: 4098-1191 SLP Time Calculation (min): 60 min  Assessment / Plan / Recommendation Clinical Impression  Pt more lethargic today.  Multiple family members room.  Reviewed MBS results and recommendations, explained functional deficits seen on MBS.  Pt/family has chosen to have PEG tube placed, which is planned for tomorrow. Reviewed what a "normal" swallow is, and how pt's swallow is currently functioning.  Per MBS, pt with delayed swallow and post-swallow residue, with inability to follow directions consistently for airway protection.  Reviewed and provided written oropharyngeal strengthening exercises for family to complete with pt as she tolerates.  Pt too lethargic to attempt them today.  SLP also reviewed importance of frequent oral care with family, and provided oral sponges and suction attachments.  Encouraged family to ask RN for assistance.  Discouraged leaving used toothettes soaking in water, due to development of bacteria.      Diet Recommendation  Continue with Current Diet: NPO    SLP Plan Continue with current plan of care   Pertinent Vitals/Pain No pain reported   Swallowing Goals  SLP Swallowing Goals Goal #3: Pt will tolerate trials of dysphagia therapy to increase oropharyngeal swallow function and safety for comfort po.  General Temperature Spikes Noted: No Respiratory Status: Supplemental O2 delivered via (comment) ([employer]@3L ) Behavior/Cognition: Confused;Distractible;Requires cueing;Decreased sustained attention;Lethargic Oral Cavity - Dentition: Dentures, top;Dentures, bottom Patient Positioning: Upright in chair  Oral Cavity - Oral Hygiene Does patient have any of the following "at risk" factors?: Lips - dry, cracked;Oxygen therapy - cannula, mask, simple oxygen devices;Nutritional status - inadequate Brush patient's teeth BID  with toothbrush (using toothpaste with fluoride): Yes Patient is AT RISK - Oral Care Protocol followed (see row info): Yes   Dysphagia Treatment Treatment focused on: Patient/family/caregiver education Family/Caregiver Educated: 2 daughters and son Treatment Methods/Modalities: Other (comment) Patient observed directly with PO's: No Reason PO's not observed: Lethargic;Other (comment) (unsafe for po intake)   GO   Celia B. Murvin Natal [facility], CCC-SLP [phone]  Ashley Rubio 01/17/2013, 2:06 PM

## 2013-01-17 NOTE — Progress Notes (Signed)
Stroke education was done at the bedside with the patients daughter and granddaughter. They were given a booklet about stroke. We went over the signs and symptoms of a stroke, preventative measures, and what to do in the event of a stroke. They voiced an understanding and stated they did not have any questions.

## 2013-01-17 NOTE — Progress Notes (Signed)
  Date: 01/17/2013  Patient name: Ashley Rubio  Medical record number: [id number]  Date of birth: [birth year]   This patient has been seen and the plan of care was discussed with the house staff. Please see their note for complete details. I concur with their findings with the following additions/corrections:  Agree with plan as described by Dr. Andrey Campanile. We will have patient get PEG placed by IR. If unable to get on schedule for Friday, will place picc line to consider getting TPN prior to getting tube feeds. She is more alert today due to extended family being here. She still has significant neurologic deficit on the left, no improvement as of yett. Will get carotid US per dr. Pearlean Brownie.  Judyann Munson, MD 01/17/2013, 10:58 PM

## 2013-01-17 NOTE — Progress Notes (Signed)
ANTICOAGULATION CONSULT NOTE - Initial Consult  Pharmacy Consult for Heparin Indication: hx atrial fibrillation, recent upper extremity DVT, and recent stroke  Allergies  Allergen Reactions  . Penicillins Anaphylaxis and Swelling  . Ambien [Zolpidem Tartrate]     tachycardia    . Aspirin Nausea And Vomiting  . Codeine Itching  . Tartrazine Nausea Only    Patient Measurements: Height: 5\' 6"  (167.6 cm) Weight: 133 lb 12.8 oz (60.691 kg) IBW/kg (Calculated) : 59.3 Heparin Dosing Weight: 66 kg  Vital Signs: Temp: 97.6 F (36.4 C) (08/14 0547) Temp src: Oral (08/14 0547) BP: 140/87 mmHg (08/14 0547) Pulse Rate: 67 (08/14 0547)  Labs:  Recent Labs  01/15/13 0615 01/15/13 0821 01/15/13 2035 01/16/13 0400 01/17/13 0420  HGB  --  12.5  --  12.6 12.4  HCT  --  37.5  --  37.7 36.6  PLT  --  253  --  274 249  LABPROT 23.5*  --   --  20.1* 21.2*  INR 2.17*  --   --  1.77* 1.90*  HEPARINUNFRC  --   --   --   --  <0.10*  CREATININE 0.67 0.66 0.58 0.61 0.66    Estimated Creatinine Clearance: 56.9 ml/min (by C-G formula based on Cr of 0.66).   Medical History: Past Medical History  Diagnosis Date  . Hypertension   . Unspecified venous (peripheral) insufficiency   . Personal history of malignant neoplasm of large intestine   . Cancer     colon  . Varicose veins of lower extremities with other complications     Medications:  Scheduled:  . antiseptic oral rinse  15 mL Mouth Rinse q12n4p  . aztreonam  2 g Intravenous Q8H  . budesonide-formoterol  2 puff Inhalation BID  . chlorhexidine  15 mL Mouth Rinse BID  . donepezil  5 mg Oral QHS  . haloperidol lactate  1 mg Intravenous Once  . metoprolol  5 mg Intravenous Q6H  . vancomycin  750 mg Intravenous Q12H    Assessment: 77 yo F on Coumadin PTA for hx Afib and RUE DVT.  Noted stroke team recommendations for IV heparin until PEG can be placed with plans to transition to Eliquis.  INR today = 1.90  Heparin  undetectable     Goal of Therapy:  Heparin level 0.3-0.5 units/ml Monitor platelets by anticoagulation protocol: Yes   Plan:  Increase heparin to 1000 units/hr and recheck in 8 hours.   Janice Coffin

## 2013-01-17 NOTE — Plan of Care (Signed)
Problem: Consults Goal: Ischemic Stroke Patient Education See Patient Education Module for education specifics. Outcome: Completed/Met Date Met:  01/17/13 Education was done with patients daughter and granddaughter at the bedside. They both verbalized and understanding and stated they had no further question.

## 2013-01-17 NOTE — Progress Notes (Signed)
VASCULAR LAB PRELIMINARY  PRELIMINARY  PRELIMINARY  PRELIMINARY  Carotid duplex  completed.    Preliminary report:  Right:  1-39% ICA stenosis.  Left:  Occluded internal carotid artery.  Bilateral:  Vertebral artery flow is antegrade.     Frances Ambrosino, RVT 01/17/2013, 11:51 AM

## 2013-01-17 NOTE — Progress Notes (Signed)
Patient ID: Ashley Rubio, female   DOB: 20-Feb-1936, 77 y.o.   MRN: [id number]   Subjective: No acute events overnight. Today Ashley Rubio is somnolent although she is more alert and responsive than she has been for the past two days. When asked how she is feeling today, she responds that she is worried about tomorrow, and "just wants it to be over." When we asked what she meant, she replied that "my lungs are going to be tested." She does not feel short of breath although she has had a cough. When asked if she had any abdominal tenderness, she states that she has experienced a burning stomach pain that does not feel like reflux and is not present on urination. No chest pain. Her son reports that the phlebotomist was unable to draw blood due to Ashley Rubio's swollen left arm. The nurse tells me that Ashley Rubio's HR has been in the 140s-150s lately but that she is due for her dose of metoprolol soon. The nurse also tells me that Ashley Rubio has urinated twice today although no accurate I/O because of incontinence.  Objective: Vital signs in last 24 hours: Filed Vitals:   01/17/13 0022 01/17/13 0547 01/17/13 0940 01/17/13 1350  BP: 126/78 140/87 122/90 143/97  Pulse: 68 67 53 102  Temp:  97.6 F (36.4 C) 97.6 F (36.4 C) 98 F (36.7 C)  TempSrc:  Oral Oral Oral  Resp:  16 18 16   Height:      Weight:      SpO2:  95% 100% 98%   Weight change: -5.534 kg (-12 lb 3.2 oz)  Intake/Output Summary (Last 24 hours) at 01/17/13 1444 Last data filed at 01/17/13 1350  Gross per 24 hour  Intake     50 ml  Output      0 ml  Net     50 ml   Physical Exam  Constitutional: She appears lethargic and malnourished. She appears unhealthy. No distress.  Cardiovascular: An irregular rhythm present. Tachycardia present.   Pulmonary/Chest:  Exam complicated by decreased respiratory effort. Unable to appreciate quality of breath sounds.  Abdominal: Soft. Bowel sounds are normal. There is no tenderness.    Musculoskeletal:       Left elbow: She exhibits swelling.       Left wrist: She exhibits swelling.  Neurological: She appears lethargic.    Lab Results: CBC    Component Value Date/Time   WBC 8.3 01/17/2013 0420   RBC 4.07 01/17/2013 0420   HGB 12.4 01/17/2013 0420   HCT 36.6 01/17/2013 0420   PLT 249 01/17/2013 0420   MCV 89.9 01/17/2013 0420   MCH 30.5 01/17/2013 0420   MCHC 33.9 01/17/2013 0420   RDW 14.0 01/17/2013 0420   LYMPHSABS 0.7 01/16/2013 0400   MONOABS 0.8 01/16/2013 0400   EOSABS 0.1 01/16/2013 0400   BASOSABS 0.0 01/16/2013 0400    CMP     Component Value Date/Time   NA 135 01/17/2013 0420   K 3.7 01/17/2013 0420   CL 104 01/17/2013 0420   CO2 22 01/17/2013 0420   GLUCOSE 86 01/17/2013 0420   BUN 13 01/17/2013 0420   CREATININE 0.66 01/17/2013 0420   CALCIUM 8.4 01/17/2013 0420   PROT 5.9* 01/10/2013 1524   ALBUMIN 2.3* 01/10/2013 1524   AST 18 01/10/2013 1524   ALT 11 01/10/2013 1524   ALKPHOS 96 01/10/2013 1524   BILITOT 0.9 01/10/2013 1524   GFRNONAA 84* 01/17/2013 0420   GFRAA >90 01/17/2013  1610      Component Value Date/Time   MG 1.4* 01/17/2013 0420   MG 1.6 01/16/2013 0400   MG 1.4* 01/15/2013 0615      Component Value Date/Time   INR 1.90* 01/17/2013 0420   INR 1.77* 01/16/2013 0400   INR 2.17* 01/15/2013 0615     Micro Results: Recent Results (from the past 240 hour(s))  URINE CULTURE     Status: None   Collection Time    01/10/13  5:20 PM      Result Value Range Status   Specimen Description URINE, CATHETERIZED   Final   Special Requests NONE   Final   Culture  Setup Time     Final   Value: 01/10/2013 19:10     Performed at [facility] Count     Final   Value: 75,000 COLONIES/ML     Performed at [facility]   Culture     Final   Value: ENTEROCOCCUS SPECIES     Performed at [facility]   Report Status 01/12/2013 FINAL   Final   Organism ID, Bacteria ENTEROCOCCUS SPECIES   Final  CULTURE, BLOOD (ROUTINE X 2)      Status: None   Collection Time    01/10/13  5:30 PM      Result Value Range Status   Specimen Description BLOOD ARM LEFT   Final   Special Requests BOTTLES DRAWN AEROBIC AND ANAEROBIC 10CC   Final   Culture  Setup Time     Final   Value: 01/10/2013 23:26     Performed at [facility]   Culture     Final   Value: NO GROWTH 5 DAYS     Performed at [facility]   Report Status 01/16/2013 FINAL   Final  CULTURE, BLOOD (ROUTINE X 2)     Status: None   Collection Time    01/10/13  5:39 PM      Result Value Range Status   Specimen Description BLOOD HAND LEFT   Final   Special Requests BOTTLES DRAWN AEROBIC ONLY 8CC   Final   Culture  Setup Time     Final   Value: 01/10/2013 23:26     Performed at [facility]   Culture     Final   Value: NO GROWTH 5 DAYS     Performed at [facility]   Report Status 01/16/2013 FINAL   Final   Studies/Results: Mr Brain Wo Contrast  01/16/2013   *RADIOLOGY REPORT*  Clinical Data: Weakness and somnolence.  Rule out stroke.  MRI HEAD WITHOUT CONTRAST  Technique:  Multiplanar, multiecho pulse sequences of the brain and surrounding structures were obtained according to standard protocol without intravenous contrast.  Comparison: Head CT 01/08/2011.  Findings:  Calvarium and upper cervical spine: No marrow signal abnormality. Degenerative disc and facet disease with C3-4 retrolisthesis and C4- 5 anterolisthesis.  Orbits: No significant findings.  Sinuses: Clear. Left mastoid effusion, subtotal.  Brain: Patchy acute infarctions in the right caudate body and subcortical high insular region, extending into the right corona radiata.  There are small ( sub centimeter) cortical infarcts in the right parietal and occipital lobes.  Absent flow related signal loss within the left cervical and cavernous carotid, with reconstitution at the level of the A1 and M1 segments.  Global atrophy, without lobar predominance. Scattered cerebral white matter  T2 hyperintense signal abnormalities, usually related to chronic small vessel  ischemia.  Ex vacuo ventricular enlargement without hydrocephalus.  No acute hemorrhage or evidence of mass lesion.  Critical Value/emergent results were called by telephone at the time of interpretation on 01/16/2013 at 01:45 to Dr Claudell Kyle, who verbally acknowledged these results.  IMPRESSION:  1. Small, scattered right cerebral hemispheric acute infarctions, both in the MCA and PCA distributions.  2. Occluded (more likely) or slowly flowing left cervical and cavernous ICA. In the absence of left-sided infarction, this is likely chronic.  3.  Advanced global brain atrophy.   Original Report Authenticated By: Tiburcio Pea   Medications: I have reviewed the patient's current medications. Scheduled Meds:  antiseptic oral rinse  15 mL Mouth Rinse q12n4p   aztreonam  2 g Intravenous Q8H   budesonide-formoterol  2 puff Inhalation BID   chlorhexidine  15 mL Mouth Rinse BID   donepezil  5 mg Oral QHS   haloperidol lactate  1 mg Intravenous Once   metoprolol  5 mg Intravenous Q6H   simvastatin  20 mg Oral q1800   vancomycin  750 mg Intravenous Q12H   Continuous Infusions:  sodium chloride 75 mL/hr at 01/17/13 0022   feeding supplement (JEVITY 1.2 CAL)     heparin 1,000 Units/hr (01/17/13 0658)   PRN Meds:.RESOURCE THICKENUP CLEAR Assessment/Plan: Principal Problem:   HCAP (healthcare-associated pneumonia) Active Problems:   Atrial fibrillation   Hypertension   Urinary tract infection   Embolic stroke involving carotid artery   Dysphagia, unspecified(787.20)   Failure to thrive in adult   Palliative care encounter  Ashley Rubio is a 77 year old lady with a PMHx of atrial fibrillation, hypertension, and dementia who presents with generalized weakness and left lower lobe HCAP.  **Stroke  MRI of brain showed small, scattered right cerebral hemispheric acute infarctions, both in the MCA and PCA  distributions as well as probable occlusion of the left internal carotid artery. The infarcts do not explain the entire clinical picture however they account for the left sided weakness. The family is aware of the results of the MRI.  -Continue PT  -Per neurology recommendations, discontinue rectal aspirin and continue IV heparin until PEG is in place. After PEG is placed, transition to apixaban (Eliquis) 5 mg BID and stop heparin. -She has been placed on simvastatin 20 mg po. Hold until PEG is in place.  -Carotid dopplers  **HCAP  She still has coarse breath sounds on exam. Recent CXR shows pulmonary edema, bilateral lower lobe infiltrates with left greater than right, and small bilateral effusions. She failed her barium swallow study on Tuesday. Etiology of pulmonary infiltrates could be pulmonary edema due to atrial fibrillation, IV fluids, or likely aspiration given her failed swallow study and the new infiltrate in the RLL. Her current antibiotic regimen should provide coverage if aspiration is the case. She is on day 8 of aztreonam and vancomycin.  - NPO  - Last day of aztreonam/vancomycin **Swollen left forearm/wrist/hand Her left arm, wrist, and hand are swollen, red, warm, and painful. She has sensation in the affected regions. On exam, the arm appears the same as her right arm did when it had a DVT. Most likely another DVT despite anticoagulation. Differential includes superficial thrombophlebitis and third spacing due to hypoalbuminemia. Doppler would not change management if positive for DVT. -Continue heparin **Decreased Appetite/Malnourishment  We attended the goals of care meeting with palliative care on Wednesday. The family was divided as to comfort care vs. placement of NG/PEG. No decisions were made at that  time. After the palliative care family meeting, the patient told Jannette Fogo, NP, that she would like to try the PEG feeding tube.  -Per interventional radiology, Ashley Rubio  should be the first case tomorrow AM for PEG tube placement.  -Consult nutrition for specific feeding formula  -Monitor magnesium, potassium, and phosphorous daily after placement of  PEG tube (refeeding syndrome). -Replete with magnesium 2g IV today   -Monitor weight trends, labs  **Altered Mental Status  She has been alert to person only. She probably has undergone a worsening of her underlying dementia and will not return to her baseline of living independently, and family is aware of this.  - Haldol prn for agitation  - Consider LP for possible HSV encephalitis **Atrial fibrillation/Tachycardia/Bradycardia  Patient cannot take warfarin due to swallowing difficulties. She is sub-thearpeutic today with an INR of 1.90. - IV heparin  - Metoprolol IV 5 mg q 6 hrs. for rate control  **HTN  BP ranging (100s-140s)/(40s-80s)  -Holding lisinopril  **Iatrogenic Hyperkalemia  Resolved.  -Monitor electrolytes and treat hyperkalemia if present  -Continue to hold lisinopril  **DVT PPX  -Heparin  This is a Psychologist, occupational Note.  The care of the patient was discussed with Dr. Andrey Campanile and the assessment and plan formulated with their assistance.  Please see their attached note for official documentation of the daily encounter.   LOS: 7 days   Merrie Roof, MS3 01/17/2013, 2:44 PM

## 2013-01-17 NOTE — Progress Notes (Signed)
ANTICOAGULATION CONSULT NOTE - Follow Up Consult  Pharmacy Consult for Heparin Indication: hx atrial fibrillation, recent upper extremity DVT, and recent stroke  Allergies  Allergen Reactions  . Penicillins Anaphylaxis and Swelling  . Ambien [Zolpidem Tartrate]     tachycardia    . Aspirin Nausea And Vomiting  . Codeine Itching  . Tartrazine Nausea Only    Patient Measurements: Height: 5\' 6"  (167.6 cm) Weight: 133 lb 12.8 oz (60.691 kg) IBW/kg (Calculated) : 59.3 Heparin Dosing Weight: 60 kg  Vital Signs: Temp: 98.3 F (36.8 C) (08/14 1710) Temp src: Oral (08/14 1710) BP: 132/61 mmHg (08/14 1710) Pulse Rate: 119 (08/14 1710)  Labs:  Recent Labs  01/15/13 0615  01/15/13 0821 01/15/13 2035 01/16/13 0400 01/17/13 0420 01/17/13 1716  HGB  --   < > 12.5  --  12.6 12.4  --   HCT  --   --  37.5  --  37.7 36.6  --   PLT  --   --  253  --  274 249  --   LABPROT 23.5*  --   --   --  20.1* 21.2*  --   INR 2.17*  --   --   --  1.77* 1.90*  --   HEPARINUNFRC  --   --   --   --   --  <0.10* <0.10*  CREATININE 0.67  --  0.66 0.58 0.61 0.66  --   < > = values in this interval not displayed.  Estimated Creatinine Clearance: 56.9 ml/min (by C-G formula based on Cr of 0.66).   Medications:  Scheduled:  . antiseptic oral rinse  15 mL Mouth Rinse q12n4p  . aztreonam  2 g Intravenous Q8H  . budesonide-formoterol  2 puff Inhalation BID  . chlorhexidine  15 mL Mouth Rinse BID  . donepezil  5 mg Oral QHS  . haloperidol lactate  1 mg Intravenous Once  . metoprolol  5 mg Intravenous Q6H  . simvastatin  20 mg Oral q1800  . vancomycin  750 mg Intravenous Q12H   Infusions:  . sodium chloride 75 mL/hr at 01/17/13 0022  . feeding supplement (JEVITY 1.2 CAL)    . heparin 1,000 Units/hr (01/17/13 0454)    Assessment: 77 yo F continues on heparin for hx AFib, RUE DVT, and CVA.  Heparin level remains undetectable despite rate increase.  No problems with IV infusion per discussion  with RN.  Hopeful for PEG placement tomorrow depending on INR result.  Goal of Therapy:  Heparin level 0.3-0.5 units/ml Monitor platelets by anticoagulation protocol: Yes   Plan:  Increase heparin to 1200 units/hr. Heparin level 6 hours after rate change. Continue daily heparin level and CBC. Follow up plans for PEG and initiation of oral anticoagulant.  [employer], Pharm.D., BCPS Clinical Pharmacist Pager [phone] 01/17/2013 6:37 PM

## 2013-01-17 NOTE — Progress Notes (Signed)
Physical Therapy Treatment Patient Details Name: Ashley Rubio MRN: [id number] DOB: February 06, 1936 Today's Date: 01/17/2013 Time: 7829-5621 PT Time Calculation (min): 30 min  PT Assessment / Plan / Recommendation  History of Present Illness Ashley Rubio is an 77 y.o. female history of atrial fibrillation on anticoagulation (2.55 on admit), hypertension and dementia who was admitted on 01/10/2013 for management of acute pneumonia. She's had persistent altered mental status with reduced level of responsiveness. MRI was obtained on 01/15/2013 which showed multiple small cerebral infarctions involving middle and posterior cerebral artery territories on the right. 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 This was thought to probably be chronic since patient had no signs of acute left cerebral infarction. Time of onset of infarctions is unclear.Marland Kitchen She was recently hospitalized for urinary tract infection and associated encephalopathy, but subsequently improved and was discharged.   PT Comments   Pt limited due to overall fatigue.  Pt able to transfer to recliner with +2 (A) and use of drop arm recliner.  RN to use lift to get pt back to bed.  Pt new set of weakness on left side due to small infarcts noted on 01/15/13 on right MCA and PCA.    Follow Up Recommendations  SNF;Supervision/Assistance - 24 hour     Equipment Recommendations  Wheelchair (measurements PT)    Frequency Min 2X/week   Progress towards PT Goals Progress towards PT goals: Not progressing toward goals - comment (due to fatigue and now new onset of CVA)  Plan Current plan remains appropriate    Precautions / Restrictions Precautions Precautions: Fall Precaution Comments: monitor sats Restrictions Weight Bearing Restrictions: No   Pertinent Vitals/Pain No c/o pain    Mobility  Bed Mobility Bed Mobility: Supine to Sit;Sit to Supine Supine to Sit: 1:  +2 Total assist Supine to Sit: Patient Percentage: 0% Sitting - Scoot to Edge of Bed: 1: +2 Total assist Sitting - Scoot to Edge of Bed: Patient Percentage: 0% Sit to Supine: 1: +1 Total assist Sit to Supine: Patient Percentage: 0% Details for Bed Mobility Assistance: +2 assist with all mobility, verbal cues given with time however pt either unable to process or physically not capable due to weakness and required assist, HR up to 150's per nsg staff with pt sitting EOB so assisted back to supine Transfers Transfers: Lateral/Scoot Transfers Lateral/Scoot Transfers: 1: +2 Total assist (with drop chair) Lateral Transfers: Patient Percentage: 10% Details for Transfer Assistance: +2 (A) with transfer and use of pad; pt able to maintain upright posture during transfer however did not assist Ambulation/Gait Ambulation/Gait Assistance: Not tested (comment) Modified Rankin (Stroke Patients Only) Pre-Morbid Rankin Score: Moderately severe disability Modified Rankin: Severe disability    Exercises     PT Diagnosis:    PT Problem List:   PT Treatment Interventions:     PT Goals (current goals can now be found in the care plan section) Acute Rehab PT Goals Patient Stated Goal: Did not set PT Goal Formulation: Patient unable to participate in goal setting Time For Goal Achievement: 01/26/13 Potential to Achieve Goals: Fair  Visit Information  Last PT Received On: 01/17/13 Assistance Needed: +2 History of Present Illness: Ashley Rubio is an 77 y.o. female history of atrial fibrillation on anticoagulation (2.55 on admit), hypertension and dementia who was admitted on 01/10/2013 for management of acute pneumonia. She's had persistent altered mental status with reduced level of responsiveness. MRI was obtained on 01/15/2013 which showed  multiple small cerebral infarctions involving middle and posterior cerebral artery territories on the right. There also findings indicative of probable occlusion  of the cavernous portion of the left internal carotid artery. This was thought to probably be chronic since patient had no signs of acute left cerebral infarction. Time of onset of infarctions is unclear.Marland Kitchen She was recently hospitalized for urinary tract infection and associated encephalopathy, but subsequently improved and was discharged.    Subjective Data  Subjective: "It feels good to get off my back." Patient Stated Goal: Did not set   Cognition  Cognition Arousal/Alertness: Awake/alert Behavior During Therapy: WFL for tasks assessed/performed Overall Cognitive Status: Difficult to assess    Balance  Balance Balance Assessed: Yes Static Sitting Balance Static Sitting - Balance Support: Bilateral upper extremity supported;Feet supported Static Sitting - Level of Assistance: 1: +1 Total assist Static Sitting - Comment/# of Minutes: Pt sat EOB ~5 minutes to prepare for transfer with initial total (A) to sit upright and progressed to min (A) to prevent left sided lean.  End of Session PT - End of Session Equipment Utilized During Treatment: Gait belt Activity Tolerance: Patient limited by fatigue;Other (comment) Patient left: in chair;with call bell/phone within reach Nurse Communication: Need for lift equipment   GP     Ashley Rubio 01/17/2013, 1:39 PM  [place], PT DPT [phone]

## 2013-01-17 NOTE — H&P (Addendum)
Ashley Rubio is an 77 y.o. female.   Chief Complaint: Hx of pneumonia and UTI- 8/7- now treated Dementia; AMS Recent CVA; dysphagia; deconditioning; aspiration risk scheduled for percutaneous gastric tube  For long term care/Hospice soon  Hx Afib; on heparin now (stopped coumadin in hospital- INR 1.9 today) HPI: CVA; dysphagia; HTN; dementia; hx colon ca; DNR/ DNI  Past Medical History  Diagnosis Date  . Hypertension   . Unspecified venous (peripheral) insufficiency   . Personal history of malignant neoplasm of large intestine   . Cancer     colon  . Varicose veins of lower extremities with other complications     Past Surgical History  Procedure Laterality Date  . Neck surgery  2003  . Tongue biopsy  2003  . Abdominal hysterectomy    . Breast biopsy    . Colonoscopy    . Gastric perforation repair     . Cardioversion    . Leg surgery Left     Family History  Problem Relation Age of Onset  . Kidney cancer Mother    Social History:  reports that she has never smoked. She has never used smokeless tobacco. She reports that she does not drink alcohol or use illicit drugs.  Allergies:  Allergies  Allergen Reactions  . Penicillins Anaphylaxis and Swelling  . Ambien [Zolpidem Tartrate]     tachycardia    . Aspirin Nausea And Vomiting  . Codeine Itching  . Tartrazine Nausea Only    Medications Prior to Admission  Medication Sig Dispense Refill  . budesonide-formoterol (SYMBICORT) 80-4.5 MCG/ACT inhaler Inhale 2 puffs into the lungs 2 (two) times daily.      . digoxin (LANOXIN) 0.25 MG tablet Take 0.25 mg by mouth daily.      Marland Kitchen donepezil (ARICEPT) 5 MG tablet Take 5 mg by mouth at bedtime.      Marland Kitchen lisinopril (PRINIVIL,ZESTRIL) 10 MG tablet Take 10 mg by mouth daily.      . metoprolol (LOPRESSOR) 50 MG tablet Take 50 mg by mouth 2 (two) times daily.      Marland Kitchen warfarin (COUMADIN) 2 MG tablet Take 1.5 tablets (3 mg total) by mouth daily. Takes daily at 5pm      .  haloperidol (HALDOL) 2 MG tablet Take 1 tablet (2 mg total) by mouth 3 (three) times daily as needed. For delirium.  Please do not give it patient is drowsy.        Results for orders placed during the hospital encounter of 01/10/13 (from the past 48 hour(s))  BASIC METABOLIC PANEL     Status: Abnormal   Collection Time    01/15/13  8:35 PM      Result Value Range   Sodium 136  135 - 145 mEq/L   Potassium 4.0  3.5 - 5.1 mEq/L   Chloride 105  96 - 112 mEq/L   CO2 22  19 - 32 mEq/L   Glucose, Bld 98  70 - 99 mg/dL   BUN 8  6 - 23 mg/dL   Creatinine, Ser 0.98  0.50 - 1.10 mg/dL   Calcium 8.6  8.4 - 11.9 mg/dL   GFR calc non Af Amer 88 (*) >90 mL/min   GFR calc Af Amer >90  >90 mL/min   Comment:            The eGFR has been calculated     using the CKD EPI equation.     This calculation has not been  validated in all clinical     situations.     eGFR's persistently     <90 mL/min signify     possible Chronic Kidney Disease.  PROTIME-INR     Status: Abnormal   Collection Time    01/16/13  4:00 AM      Result Value Range   Prothrombin Time 20.1 (*) 11.6 - 15.2 seconds   INR 1.77 (*) 0.00 - 1.49  BASIC METABOLIC PANEL     Status: Abnormal   Collection Time    01/16/13  4:00 AM      Result Value Range   Sodium 136  135 - 145 mEq/L   Potassium 3.9  3.5 - 5.1 mEq/L   Chloride 104  96 - 112 mEq/L   CO2 23  19 - 32 mEq/L   Glucose, Bld 95  70 - 99 mg/dL   BUN 10  6 - 23 mg/dL   Creatinine, Ser 1.61  0.50 - 1.10 mg/dL   Calcium 8.7  8.4 - 09.6 mg/dL   GFR calc non Af Amer 87 (*) >90 mL/min   GFR calc Af Amer >90  >90 mL/min   Comment:            The eGFR has been calculated     using the CKD EPI equation.     This calculation has not been     validated in all clinical     situations.     eGFR's persistently     <90 mL/min signify     possible Chronic Kidney Disease.  CBC WITH DIFFERENTIAL     Status: Abnormal   Collection Time    01/16/13  4:00 AM      Result Value  Range   WBC 10.9 (*) 4.0 - 10.5 K/uL   RBC 4.18  3.87 - 5.11 MIL/uL   Hemoglobin 12.6  12.0 - 15.0 g/dL   HCT 04.5  40.9 - 81.1 %   MCV 90.2  78.0 - 100.0 fL   MCH 30.1  26.0 - 34.0 pg   MCHC 33.4  30.0 - 36.0 g/dL   RDW 91.4  78.2 - 95.6 %   Platelets 274  150 - 400 K/uL   Neutrophils Relative % 85 (*) 43 - 77 %   Neutro Abs 9.2 (*) 1.7 - 7.7 K/uL   Lymphocytes Relative 7 (*) 12 - 46 %   Lymphs Abs 0.7  0.7 - 4.0 K/uL   Monocytes Relative 7  3 - 12 %   Monocytes Absolute 0.8  0.1 - 1.0 K/uL   Eosinophils Relative 1  0 - 5 %   Eosinophils Absolute 0.1  0.0 - 0.7 K/uL   Basophils Relative 0  0 - 1 %   Basophils Absolute 0.0  0.0 - 0.1 K/uL  MAGNESIUM     Status: None   Collection Time    01/16/13  4:00 AM      Result Value Range   Magnesium 1.6  1.5 - 2.5 mg/dL  PHOSPHORUS     Status: None   Collection Time    01/16/13  4:00 AM      Result Value Range   Phosphorus 3.0  2.3 - 4.6 mg/dL  LIPID PANEL     Status: Abnormal   Collection Time    01/16/13  4:00 AM      Result Value Range   Cholesterol 155  0 - 200 mg/dL   Triglycerides 213  <086 mg/dL  HDL 28 (*) >39 mg/dL   Total CHOL/HDL Ratio 5.5     VLDL 23  0 - 40 mg/dL   LDL Cholesterol 161 (*) 0 - 99 mg/dL   Comment:            Total Cholesterol/HDL:CHD Risk     Coronary Heart Disease Risk Table                         Men   Women      1/2 Average Risk   3.4   3.3      Average Risk       5.0   4.4      2 X Average Risk   9.6   7.1      3 X Average Risk  23.4   11.0                Use the calculated Patient Ratio     above and the CHD Risk Table     to determine the patient's CHD Risk.                ATP III CLASSIFICATION (LDL):      <100     mg/dL   Optimal      096-045  mg/dL   Near or Above                        Optimal      130-159  mg/dL   Borderline      409-811  mg/dL   High      >914     mg/dL   Very High  HEMOGLOBIN A1C     Status: None   Collection Time    01/16/13  4:20 AM      Result Value  Range   Hemoglobin A1C 5.6  <5.7 %   Comment: (NOTE)                                                                               According to the ADA Clinical Practice Recommendations for 2011, when     HbA1c is used as a screening test:      >=6.5%   Diagnostic of Diabetes Mellitus               (if abnormal result is confirmed)     5.7-6.4%   Increased risk of developing Diabetes Mellitus     References:Diagnosis and Classification of Diabetes Mellitus,Diabetes     Care,2011,34(Suppl 1):S62-S69 and Standards of Medical Care in             Diabetes - 2011,Diabetes Care,2011,34 (Suppl 1):S11-S61.   Mean Plasma Glucose 114  <117 mg/dL   Comment: Performed at [facility]  PROTIME-INR     Status: Abnormal   Collection Time    01/17/13  4:20 AM      Result Value Range   Prothrombin Time 21.2 (*) 11.6 - 15.2 seconds   INR 1.90 (*) 0.00 - 1.49  BASIC METABOLIC PANEL     Status: Abnormal   Collection Time    01/17/13  4:20 AM  Result Value Range   Sodium 135  135 - 145 mEq/L   Potassium 3.7  3.5 - 5.1 mEq/L   Chloride 104  96 - 112 mEq/L   CO2 22  19 - 32 mEq/L   Glucose, Bld 86  70 - 99 mg/dL   BUN 13  6 - 23 mg/dL   Creatinine, Ser 0.98  0.50 - 1.10 mg/dL   Calcium 8.4  8.4 - 11.9 mg/dL   GFR calc non Af Amer 84 (*) >90 mL/min   GFR calc Af Amer >90  >90 mL/min   Comment: (NOTE)     The eGFR has been calculated using the CKD EPI equation.     This calculation has not been validated in all clinical situations.     eGFR's persistently <90 mL/min signify possible Chronic Kidney     Disease.  MAGNESIUM     Status: Abnormal   Collection Time    01/17/13  4:20 AM      Result Value Range   Magnesium 1.4 (*) 1.5 - 2.5 mg/dL  PHOSPHORUS     Status: None   Collection Time    01/17/13  4:20 AM      Result Value Range   Phosphorus 2.6  2.3 - 4.6 mg/dL  HEPARIN LEVEL (UNFRACTIONATED)     Status: Abnormal   Collection Time    01/17/13  4:20 AM      Result Value Range    Heparin Unfractionated <0.10 (*) 0.30 - 0.70 IU/mL   Comment:            IF HEPARIN RESULTS ARE BELOW     EXPECTED VALUES, AND PATIENT     DOSAGE HAS BEEN CONFIRMED,     SUGGEST FOLLOW UP TESTING     OF ANTITHROMBIN III LEVELS.  CBC     Status: None   Collection Time    01/17/13  4:20 AM      Result Value Range   WBC 8.3  4.0 - 10.5 K/uL   RBC 4.07  3.87 - 5.11 MIL/uL   Hemoglobin 12.4  12.0 - 15.0 g/dL   HCT 14.7  82.9 - 56.2 %   MCV 89.9  78.0 - 100.0 fL   MCH 30.5  26.0 - 34.0 pg   MCHC 33.9  30.0 - 36.0 g/dL   RDW 13.0  86.5 - 78.4 %   Platelets 249  150 - 400 K/uL  GLUCOSE, CAPILLARY     Status: None   Collection Time    01/17/13  7:43 AM      Result Value Range   Glucose-Capillary 89  70 - 99 mg/dL   Dg Chest 2 View  6/96/2952   *RADIOLOGY REPORT*  Clinical Data: Cough, coarse breath sounds.  Somnolence. Fatigue.  CHEST - 2 VIEW  Comparison: 01/10/2013  Findings: Heart is enlarged.  There is dense opacity at the left lung base, obscuring the left hemidiaphragm.  Patchy density at the right lung base has increased, consistent with infiltrate. Bilateral pleural effusions are noted.  There are interstitial changes of pulmonary edema.  IMPRESSION:  1.  Cardiomegaly and pulmonary edema. 2.  Bilateral lower lobe infiltrates, left greater than right. 3.  Bilateral small effusions.   Original Report Authenticated By: Norva Pavlov, M.D.   Mr Brain Wo Contrast  01/16/2013   *RADIOLOGY REPORT*  Clinical Data: Weakness and somnolence.  Rule out stroke.  MRI HEAD WITHOUT CONTRAST  Technique:  Multiplanar, multiecho pulse sequences of the brain  and surrounding structures were obtained according to standard protocol without intravenous contrast.  Comparison: Head CT 01/08/2011.  Findings:  Calvarium and upper cervical spine: No marrow signal abnormality. Degenerative disc and facet disease with C3-4 retrolisthesis and C4- 5 anterolisthesis.  Orbits: No significant findings.  Sinuses:  Clear. Left mastoid effusion, subtotal.  Brain: Patchy acute infarctions in the right caudate body and subcortical high insular region, extending into the right corona radiata.  There are small ( sub centimeter) cortical infarcts in the right parietal and occipital lobes.  Absent flow related signal loss within the left cervical and cavernous carotid, with reconstitution at the level of the A1 and M1 segments.  Global atrophy, without lobar predominance. Scattered cerebral white matter T2 hyperintense signal abnormalities, usually related to chronic small vessel ischemia.  Ex vacuo ventricular enlargement without hydrocephalus.  No acute hemorrhage or evidence of mass lesion.  Critical Value/emergent results were called by telephone at the time of interpretation on 01/16/2013 at 01:45 to Dr Claudell Kyle, who verbally acknowledged these results.  IMPRESSION:  1. Small, scattered right cerebral hemispheric acute infarctions, both in the MCA and PCA distributions.  2. Occluded (more likely) or slowly flowing left cervical and cavernous ICA. In the absence of left-sided infarction, this is likely chronic.  3.  Advanced global brain atrophy.   Original Report Authenticated By: Tiburcio Pea   Dg Swallowing Func-speech Pathology  01/15/2013   Lenor Derrick, CCC-SLP     01/15/2013 12:34 PM Objective Swallowing Evaluation: Modified Barium Swallowing Study   Patient Details  Name: Iridiana Fonner MRN: [id number] Date of Birth: [birth year]  Today's Date: 01/15/2013 Time: 1100-1234 SLP Time Calculation (min): 94 min  Past Medical History:  Past Medical History  Diagnosis Date  . Hypertension   . Unspecified venous (peripheral) insufficiency   . Personal history of malignant neoplasm of large intestine   . Cancer     colon  . Varicose veins of lower extremities with other complications    Past Surgical History:  Past Surgical History  Procedure Laterality Date  . Neck surgery  2003  . Tongue biopsy  2003  . Abdominal hysterectomy    .  Breast biopsy    . Colonoscopy    . Gastric perforation repair     . Cardioversion    . Leg surgery Left    HPI:  Mrs. Maring was recently discharged from our hospital for  encephalopathy due to presumed UTI that worsened her underlying  dementia. She was reported to becoming increasingly weak at home  with poor po intake. She was readmitted for dehydration, HCAP due  to LLL infiltrate.     Assessment / Plan / Recommendation Clinical Impression  Dysphagia Diagnosis: Moderate oral phase dysphagia;Severe oral  phase dysphagia Clinical impression: Pt. exhibits a moderate oral phase dysphagia  and severe pharyngeal dysphagia, characterized by weak lingual  movements, resulting in delayed oral transit and poor posterier  propulsion of the bolus orally and pharyngeally; decreased  laryngeal elevation and hyoid excursion, and epiglottic  deflection during the swallow, resulting in penetration of both  nectar and honey consistencies;  and decreased base of tongue  contraction to the posterior pharyngeal wall and decreased  pharyngeal perestalsis, resulting in vallucular residue with  puree, with risk of aspiration after the swallow.  Pt. was unable  to f/c's for compensatory strategies, but finally was able to  complete a dry swallow which decreased the laryngeal stasis.   This required max verbal and tactile cues, and  would not be  possible to complete after every swallow.  Pt. is at risk for  aspiration with any/all possible consistencies at this time.    Treatment Recommendation  Defer treatment plan to SLP at (Comment) (Defer plan until GOC  determined)    Diet Recommendation NPO (Until MD talks with family)        Other  Recommendations     Follow Up Recommendations  Skilled Nursing facility;24 hour supervision/assistance    Frequency and Duration        Pertinent Vitals/Pain n/a    SLP Swallow Goals     General Date of Onset: 01/04/13 HPI: Mrs. Sandstrom was recently discharged from our hospital for  encephalopathy due  to presumed UTI that worsened her underlying  dementia. She was reported to becoming increasingly weak at home  with poor po intake. She was readmitted for dehydration, HCAP due  to LLL infiltrate. Type of Study: Modified Barium Swallowing Study Reason for Referral: Objectively evaluate swallowing function Previous Swallow Assessment: BSE 01/06/13 Diet Prior to this Study: Dysphagia 1 (puree);Nectar-thick  liquids Temperature Spikes Noted: No Respiratory Status: Room air History of Recent Intubation: No Behavior/Cognition: Alert;Distractible;Requires cueing;Doesn't  follow directions;Decreased sustained  attention;Impulsive;Confused Oral Cavity - Dentition: Dentures, top;Dentures, bottom Oral Motor / Sensory Function: Within functional limits Self-Feeding Abilities: Total assist Patient Positioning: Upright in chair Baseline Vocal Quality: Clear Volitional Cough: Congested Volitional Swallow: Unable to elicit Anatomy: Within functional limits Pharyngeal Secretions: Not observed secondary MBS    Reason for Referral Objectively evaluate swallowing function   Oral Phase Oral Preparation/Oral Phase Oral Phase: Impaired Oral - Honey Oral - Honey Teaspoon: Right anterior bolus loss;Weak lingual  manipulation;Incomplete tongue to palate contact;Reduced  posterior propulsion;Piecemeal swallowing;Delayed oral  transit;Left anterior bolus loss Oral - Nectar Oral - Nectar Teaspoon: Left anterior bolus loss;Right anterior  bolus loss;Weak lingual manipulation;Incomplete tongue to palate  contact;Piecemeal swallowing;Delayed oral transit Oral - Nectar Cup: Left anterior bolus loss;Right anterior bolus  loss;Weak lingual manipulation;Incomplete tongue to palate  contact;Piecemeal swallowing;Delayed oral transit Oral - Solids Oral - Puree: Left anterior bolus loss;Right anterior bolus  loss;Weak lingual manipulation;Incomplete tongue to palate  contact;Lingual/palatal residue;Piecemeal swallowing;Delayed oral  transit   Pharyngeal  Phase Pharyngeal Phase Pharyngeal Phase: Impaired Pharyngeal - Honey Pharyngeal - Honey Teaspoon: Reduced pharyngeal  peristalsis;Reduced epiglottic inversion;Reduced anterior  laryngeal mobility;Reduced laryngeal elevation;Reduced tongue  base retraction;Penetration/Aspiration after swallow;Compensatory  strategies attempted (Comment);Pharyngeal residue - posterior  pharnyx (Pt. unable to f/c's for compensatory strategies.) Penetration/Aspiration details (honey teaspoon): Material enters  airway, remains ABOVE vocal cords then ejected out Pharyngeal - Nectar Pharyngeal - Nectar Cup: Reduced epiglottic inversion;Reduced  anterior laryngeal mobility;Reduced laryngeal elevation;Reduced  tongue base retraction;Penetration/Aspiration during  swallow;Pharyngeal residue - valleculae Penetration/Aspiration details (nectar cup): Material enters  airway, CONTACTS cords and not ejected out Pharyngeal - Solids Pharyngeal - Puree: Delayed swallow initiation;Premature spillage  to valleculae;Reduced pharyngeal peristalsis;Reduced epiglottic  inversion;Reduced anterior laryngeal mobility;Reduced laryngeal  elevation;Reduced tongue base retraction;Pharyngeal residue -  valleculae (Double swallow difficult to obtain, but reduced  residue.)  Cervical Esophageal Phase    GO    Cervical Esophageal Phase Cervical Esophageal Phase: Vicente Masson T 01/15/2013, 12:34 PM     Review of Systems  Constitutional: Positive for weight loss. Negative for fever.  Respiratory: Negative for shortness of breath.   Cardiovascular: Negative for chest pain.  Gastrointestinal: Negative for nausea and vomiting.  Neurological: Positive for weakness.  Psychiatric/Behavioral:  Dementia    Blood pressure 122/90, pulse 53, temperature 97.6 F (36.4 C), temperature source Oral, resp. rate 18, height 5\' 6"  (1.676 m), weight 133 lb 12.8 oz (60.691 kg), SpO2 100.00%. Physical Exam  Constitutional:  Thin, frail  Cardiovascular:  Normal rate and regular rhythm.   No murmur heard. Respiratory: Effort normal and breath sounds normal. She has no wheezes.  GI: Soft. Bowel sounds are normal. There is no tenderness.  Musculoskeletal: Normal range of motion.  Moves all 4s to command Quiet- almost non verbal  Neurological:  Pt lying in bed; weak; not verbal   Skin: Skin is warm.  Psychiatric:  Family in room; consented daughter     Assessment/Plan CVA; dysphagia AMS; dementia; aspiration; deconditioning DNR/DNI- for Hospice- long term care soon Scheduled for perc G tube in IR INR 1.9 today Will check in am- will prepare for G tube placement 8/15- Family aware dependent on INR results Family aware of procedure benefits and risks and agreeable to proceed Consent signed and in chart Barium ordered; check kub; check inr On Vanco   Mateusz Neilan A 01/17/2013, 10:25 AM

## 2013-01-17 NOTE — Progress Notes (Signed)
Subjective: [name] was seen and examined by me today.  She is somnolent, as she has been for the past few days.  She is responsive to questions but speaks in a whisper.  She says she has some pain in her left arm but otherwise denies CP/SOB.  She exhibits some upper extremity swelling.  The nurse reports that she has urinated twice today.  Unsure of exact output as patient does not have a foley.  Objective: Vital signs in last 24 hours: Filed Vitals:   01/17/13 0547 01/17/13 0940 01/17/13 1350 01/17/13 1710  BP: 140/87 122/90 143/97 132/61  Pulse: 67 53 102 119  Temp: 97.6 F (36.4 C) 97.6 F (36.4 C) 98 F (36.7 C) 98.3 F (36.8 C)  TempSrc: Oral Oral Oral Oral  Resp: 16 18 16 17   Height:      Weight:      SpO2: 95% 100% 98% 98%   Weight change: -5.534 kg (-12 lb 3.2 oz)  Intake/Output Summary (Last 24 hours) at 01/17/13 1928 Last data filed at 01/17/13 1845  Gross per 24 hour  Intake    120 ml  Output    200 ml  Net    -80 ml   General: resting in bed, somnolent Cardiac: distant HS, irregularly irregular rhythm Pulm: poor effort Abd: soft, nontender, nondistended, BS present Ext: no pedal edema, upper extremity edema (L > R) Neuro: somnolent, awakens to voice and talking to family members at times  Lab Results: Basic Metabolic Panel:  Recent Labs Lab 01/16/13 0400 01/17/13 0420  NA 136 135  K 3.9 3.7  CL 104 104  CO2 23 22  GLUCOSE 95 86  BUN 10 13  CREATININE 0.61 0.66  CALCIUM 8.7 8.4  MG 1.6 1.4*  PHOS 3.0 2.6   CBC:  Recent Labs Lab 01/15/13 0821 01/16/13 0400 01/17/13 0420  WBC 6.8 10.9* 8.3  NEUTROABS 5.0 9.2*  --   HGB 12.5 12.6 12.4  HCT 37.5 37.7 36.6  MCV 91.0 90.2 89.9  PLT 253 274 249    Fasting Lipid Panel:  Recent Labs Lab 01/16/13 0400  CHOL 155  HDL 28*  LDLCALC 104*  TRIG 115  CHOLHDL 5.5   Coagulation:  Recent Labs Lab 01/14/13 1020 01/15/13 0615 01/16/13 0400 01/17/13 0420  LABPROT 24.7* 23.5* 20.1*  21.2*  INR 2.32* 2.17* 1.77* 1.90*   Micro Results: Recent Results (from the past 240 hour(s))  URINE CULTURE     Status: None   Collection Time    01/10/13  5:20 PM      Result Value Range Status   Specimen Description URINE, CATHETERIZED   Final   Special Requests NONE   Final   Culture  Setup Time     Final   Value: 01/10/2013 19:10     Performed at [facility] Count     Final   Value: 75,000 COLONIES/ML     Performed at [facility]   Culture     Final   Value: ENTEROCOCCUS SPECIES     Performed at [facility]   Report Status 01/12/2013 FINAL   Final   Organism ID, Bacteria ENTEROCOCCUS SPECIES   Final  CULTURE, BLOOD (ROUTINE X 2)     Status: None   Collection Time    01/10/13  5:30 PM      Result Value Range Status   Specimen Description BLOOD ARM LEFT   Final   Special Requests  BOTTLES DRAWN AEROBIC AND ANAEROBIC 10CC   Final   Culture  Setup Time     Final   Value: 01/10/2013 23:26     Performed at [facility]   Culture     Final   Value: NO GROWTH 5 DAYS     Performed at [facility]   Report Status 01/16/2013 FINAL   Final  CULTURE, BLOOD (ROUTINE X 2)     Status: None   Collection Time    01/10/13  5:39 PM      Result Value Range Status   Specimen Description BLOOD HAND LEFT   Final   Special Requests BOTTLES DRAWN AEROBIC ONLY 8CC   Final   Culture  Setup Time     Final   Value: 01/10/2013 23:26     Performed at [facility]   Culture     Final   Value: NO GROWTH 5 DAYS     Performed at [facility]   Report Status 01/16/2013 FINAL   Final   Studies/Results: Mr Brain Wo Contrast  01/16/2013   *RADIOLOGY REPORT*  Clinical Data: Weakness and somnolence.  Rule out stroke.  MRI HEAD WITHOUT CONTRAST  Technique:  Multiplanar, multiecho pulse sequences of the brain and surrounding structures were obtained according to standard protocol without intravenous contrast.  Comparison: Head CT  01/08/2011.  Findings:  Calvarium and upper cervical spine: No marrow signal abnormality. Degenerative disc and facet disease with C3-4 retrolisthesis and C4- 5 anterolisthesis.  Orbits: No significant findings.  Sinuses: Clear. Left mastoid effusion, subtotal.  Brain: Patchy acute infarctions in the right caudate body and subcortical high insular region, extending into the right corona radiata.  There are small ( sub centimeter) cortical infarcts in the right parietal and occipital lobes.  Absent flow related signal loss within the left cervical and cavernous carotid, with reconstitution at the level of the A1 and M1 segments.  Global atrophy, without lobar predominance. Scattered cerebral white matter T2 hyperintense signal abnormalities, usually related to chronic small vessel ischemia.  Ex vacuo ventricular enlargement without hydrocephalus.  No acute hemorrhage or evidence of mass lesion.  Critical Value/emergent results were called by telephone at the time of interpretation on 01/16/2013 at 01:45 to Dr Claudell Kyle, who verbally acknowledged these results.  IMPRESSION:  1. Small, scattered right cerebral hemispheric acute infarctions, both in the MCA and PCA distributions.  2. Occluded (more likely) or slowly flowing left cervical and cavernous ICA. In the absence of left-sided infarction, this is likely chronic.  3.  Advanced global brain atrophy.   Original Report Authenticated By: Tiburcio Pea   Medications: I have reviewed the patient's current medications. Scheduled Meds: . antiseptic oral rinse  15 mL Mouth Rinse q12n4p  . aztreonam  2 g Intravenous Q8H  . budesonide-formoterol  2 puff Inhalation BID  . chlorhexidine  15 mL Mouth Rinse BID  . donepezil  5 mg Oral QHS  . haloperidol lactate  1 mg Intravenous Once  . metoprolol  5 mg Intravenous Q6H  . simvastatin  20 mg Oral q1800  . vancomycin  750 mg Intravenous Q12H   Continuous Infusions: . sodium chloride 75 mL/hr at 01/17/13 0022  .  feeding supplement (JEVITY 1.2 CAL)    . heparin 1,200 Units/hr (01/17/13 1855)   PRN Meds:.RESOURCE THICKENUP CLEAR  Assessment/Plan: [name] is a 77 year old female with past medical history of atrial fibrillation on Coumadin, digoxin and metoprolol; hypertension; and a recent hospitalization here at  [facility] for urinary tract infection with altered mental status, who presents with a chief complaint of diffuse weakness and productive cough, found to have a left lower lobe infiltrate on chest x-ray and new ischemic CVA.  #New ischemic CVA - multiple infarcts of Right MCA and PCA evident on MRI.  Had discussion with family regarding poor prognosis with new stroke compounding previous issues of dementia, malnourishment/swallow dysfunction, HCAP.  Met with the family and palliative care to discuss goals of care:  Aggressive treatment (i.e. PEG) vs palliative care.  Palliative asked the patient her wishes and Ms. Slawson said that she wants the PEG.  The family has decided to move forward with PEG placement. - Neuro recommends heparin for anticoagulation in the setting of Afib and RUE DVT; they suggest switching the patient to Eliquis once she gets PEG.  #Health care-acquired pneumonia - Patient more listless yesterday and today with cough. X-ray at admission revealed LLL infiltrate now repeat CXR shows evidence of pulmonary edema and B/L lower lobe infiltrate (L>R)  - continue Aztreonam and vancomycin IV for HCAP vs possible aspiration PNA - Blood cultures negative; follow-up sputum culture and gram stain   #UTI - repeat urine culture positive for enterococcus  - patient on vancomycin   #Decreased appetite - had lengthy discussion with the family about options for feeding and explained that pt is at significant risk of aspiration with any diet. Explained that PEG would be a permanent option but since the patient has dementia she could potentially try to pull the tube and encounter complications  as a result. NG could be placed temporarily but the family feel the patient would be more irritated by it as she has a history of throat cancer.  The patient and family decided to move forward with PEG. - NPO diet recommended by SLP as patient failed bedside swallow eval and MBS.  - pt NPO - to IR for PEG tomorrow morning  #Diffuse weakness - Likely multifactorial as patient is deconditioned from multiple recent hospital stays, has infection (HCAP) and meets criteria for severe malnutrition. No signs of stroke on CT but patient now with LUE weakness.  MRI brain indicates acute right MCA and PCA infarctions.  Neurology has evaluated the patient and their recommendations are appreciated.  - Treating HCAP as above  - PEG tomorrow - Will continue to monitor electrolytes and replete as necessary  #Edema - upper extremity R>L swelling - could represent DVT but patient already on heparin.  Family also notes patient's face seem puffier so these fingings likely related to third spacing of fluid in the setting of malnourishment (low albumin) and IV fluids. - fluids decreased to 75cc/hr yesterday, consider slower rate or d/c once PEG in.  #Delirium/encephalopathy - Stable since discharge. She is somnolent, non-agitated.  She probably has undergone a worsening of her underlying dementia compounded by new stroke and will not return to her baseline of living independently, and family is aware of this.   - continue Aricept  - SNF vs home after d/c  #Atrial fibrillation - Patient is subtherapeutic (1.90) today  - Heparin IV per pharmacy - Eliquis after PEG - 5mg  IV metoprolol q6h (with hold parameters), holding dig   #HTN - hold lisinopril, continue metoprolol   #COPD - Stable  - continue Symbicort inhaler   #Hyperkalemia - resolved - AM BMP   #Heart murmer - Soft holosystolic murmur best heard at the apex. History of mitral regurgitation. Afebrile. No stigmata of endocarditis on exam.  -  Blood  cultures negative - no vegetations on echo   #DVT PPX - IV heparin  Dispo: Disposition is deferred at this time, awaiting improvement of current medical problems.  Anticipated discharge in approximately 1-2 day(s).   The patient does have a current PCP (S Gillermo Murdoch, MD) and does need an [facility] hospital follow-up appointment after discharge.  The patient does not have transportation limitations that hinder transportation to clinic appointments.  .Services Needed at time of discharge: Y = Yes, Blank = No PT:   OT:   RN:   Equipment:   Other:     LOS: 7 days   Evelena Peat, DO 01/17/2013, 7:28 PM

## 2013-01-18 ENCOUNTER — Inpatient Hospital Stay (HOSPITAL_COMMUNITY): Payer: Medicare Other

## 2013-01-18 DIAGNOSIS — M549 Dorsalgia, unspecified: Secondary | ICD-10-CM

## 2013-01-18 LAB — CBC
HCT: 35.5 % — ABNORMAL LOW (ref 36.0–46.0)
MCHC: 33.5 g/dL (ref 30.0–36.0)
MCV: 90.3 fL (ref 78.0–100.0)
RDW: 14 % (ref 11.5–15.5)
WBC: 8.3 10*3/uL (ref 4.0–10.5)

## 2013-01-18 LAB — GLUCOSE, CAPILLARY
Glucose-Capillary: 92 mg/dL (ref 70–99)
Glucose-Capillary: 78 mg/dL (ref 70–99)
Glucose-Capillary: 84 mg/dL (ref 70–99)
Glucose-Capillary: 77 mg/dL (ref 70–99)

## 2013-01-18 LAB — BASIC METABOLIC PANEL
BUN: 15 mg/dL (ref 6–23)
CO2: 22 mEq/L (ref 19–32)
Chloride: 104 mEq/L (ref 96–112)
Creatinine, Ser: 0.57 mg/dL (ref 0.50–1.10)

## 2013-01-18 LAB — HEPARIN LEVEL (UNFRACTIONATED): Heparin Unfractionated: 0.6 IU/mL (ref 0.30–0.70)

## 2013-01-18 MED ORDER — MORPHINE SULFATE (CONCENTRATE) 10 MG /0.5 ML PO SOLN
ORAL | Status: AC
  Administered 2013-01-19: 5 mg via ORAL
  Filled 2013-01-18: qty 0.5

## 2013-01-18 MED ORDER — RESOURCE THICKENUP CLEAR PO POWD
ORAL | Status: DC | PRN
  Filled 2013-01-18: qty 125

## 2013-01-18 MED ORDER — MORPHINE SULFATE (CONCENTRATE) 10 MG /0.5 ML PO SOLN
5.0000 mg | ORAL | Status: DC | PRN
  Administered 2013-01-19 (×2): 5 mg via ORAL
  Filled 2013-01-18 (×3): qty 0.5

## 2013-01-18 MED ORDER — HEPARIN (PORCINE) IN NACL 100-0.45 UNIT/ML-% IJ SOLN
950.0000 [IU]/h | INTRAMUSCULAR | Status: DC
  Administered 2013-01-18: 1150 [IU]/h via INTRAVENOUS
  Administered 2013-01-19: 950 [IU]/h via INTRAVENOUS
  Filled 2013-01-18 (×2): qty 250

## 2013-01-18 MED ORDER — MORPHINE SULFATE (CONCENTRATE) 10 MG /0.5 ML PO SOLN
5.0000 mg | ORAL | Status: DC | PRN
  Administered 2013-01-18: 5 mg via ORAL

## 2013-01-18 NOTE — Progress Notes (Addendum)
Patient ID: Ashley Rubio, female   DOB: [birth year], 77 y.o.   MRN: [id number]   INR today: 1.87 Too high for percutaneous G tube  Need INR to be close to 1.5 to safely proceed  RN aware We will continue to watch INR over weekend Plan for tentative placement 8/18

## 2013-01-18 NOTE — Progress Notes (Signed)
Patient ID: Ashley Rubio, female   DOB: 05/28/1936, 77 y.o.   MRN: [id number]   Subjective: Last night Ashley Rubio's arm swelling prevented adequate IV access. The order was placed for having blood drawn from the foot and her fluids were stopped. The nurse also told the night team that Ashley Rubio's left arm was weeping. When we talked with the patient this morning, she told us that she was tired and that "all they are doing never ends." The family remains divided as to whether or not to pursue the PEG tube. Per interventional radiology, her INR is still too high to have the procedure done this morning. Monday will be the earliest that they can complete the procedure. Dr. Drue Second and palliative care both spoke with the family this morning and answered their questions. Dr. Pearlean Brownie in neurology tells [place] that due to the occluded left internal carotid artery and partially occluded right internal carotid that we should monitor Ashley Rubio's blood pressure carefully as hypotension could easily impair cerebral perfusion.  Objective: Vital signs in last 24 hours: Filed Vitals:   01/17/13 2007 01/18/13 0600 01/18/13 1000 01/18/13 1312  BP: 135/56 120/68 145/70 131/85  Pulse: 103 103 99 101  Temp: 96.4 F (35.8 C) 97.6 F (36.4 C) 98.1 F (36.7 C) 97.5 F (36.4 C)  TempSrc: Axillary Oral Oral Axillary  Resp: 16 16 16 18   Height:      Weight:      SpO2: 94% 95% 96% 98%    Physical Exam  Constitutional: She appears lethargic and malnourished. She appears unhealthy.  HENT:  Head: Normocephalic and atraumatic.  Mouth/Throat: Dry mucous membranes: Dry nasal mucosa from O2 jets. Patient had nasal canula draped on forehead during exam.  Cardiovascular: Normal heart sounds.  An irregular rhythm present. Tachycardia present.   No murmur heard. Pulmonary/Chest:  Lungs clear to auscultation from anterior surface. Exam complicated by decreased respiratory effort.  Abdominal: Soft. Bowel sounds are absent.  There is no tenderness.  Musculoskeletal:       Left elbow: She exhibits swelling. Tenderness found.       Left wrist: She exhibits tenderness and swelling.       Right lower leg: She exhibits no edema.       Left lower leg: She exhibits no edema.  Hematoma and swelling on left arm from mid arm proximally to fingers distally  Neurological: She appears lethargic.   Lab Results: CBC    Component Value Date/Time   WBC 8.3 01/18/2013 0843   RBC 3.93 01/18/2013 0843   HGB 11.9* 01/18/2013 0843   HCT 35.5* 01/18/2013 0843   PLT 262 01/18/2013 0843   MCV 90.3 01/18/2013 0843   MCH 30.3 01/18/2013 0843   MCHC 33.5 01/18/2013 0843   RDW 14.0 01/18/2013 0843   LYMPHSABS 0.7 01/16/2013 0400   MONOABS 0.8 01/16/2013 0400   EOSABS 0.1 01/16/2013 0400   BASOSABS 0.0 01/16/2013 0400    CMP     Component Value Date/Time   NA 137 01/18/2013 0843   K 4.2 01/18/2013 0843   CL 104 01/18/2013 0843   CO2 22 01/18/2013 0843   GLUCOSE 99 01/18/2013 0843   BUN 15 01/18/2013 0843   CREATININE 0.57 01/18/2013 0843   CALCIUM 8.5 01/18/2013 0843   PROT 5.9* 01/10/2013 1524   ALBUMIN 2.3* 01/10/2013 1524   AST 18 01/10/2013 1524   ALT 11 01/10/2013 1524   ALKPHOS 96 01/10/2013 1524   BILITOT 0.9 01/10/2013 1524  GFRNONAA 88* 01/18/2013 0843   GFRAA >90 01/18/2013 0843      Component Value Date/Time   MG 1.4* 01/17/2013 0420   MG 1.6 01/16/2013 0400   MG 1.4* 01/15/2013 0615       Component Value Date/Time   INR 1.87* 01/18/2013 0843   INR 1.90* 01/17/2013 0420   INR 1.77* 01/16/2013 0400   Hb A1c: 5.6  Micro Results: Recent Results (from the past 240 hour(s))  URINE CULTURE     Status: None   Collection Time    01/10/13  5:20 PM      Result Value Range Status   Specimen Description URINE, CATHETERIZED   Final   Special Requests NONE   Final   Culture  Setup Time     Final   Value: 01/10/2013 19:10     Performed at [facility] Count     Final   Value: 75,000 COLONIES/ML     Performed at  [facility]   Culture     Final   Value: ENTEROCOCCUS SPECIES     Performed at [facility]   Report Status 01/12/2013 FINAL   Final   Organism ID, Bacteria ENTEROCOCCUS SPECIES   Final  CULTURE, BLOOD (ROUTINE X 2)     Status: None   Collection Time    01/10/13  5:30 PM      Result Value Range Status   Specimen Description BLOOD ARM LEFT   Final   Special Requests BOTTLES DRAWN AEROBIC AND ANAEROBIC 10CC   Final   Culture  Setup Time     Final   Value: 01/10/2013 23:26     Performed at [facility]   Culture     Final   Value: NO GROWTH 5 DAYS     Performed at [facility]   Report Status 01/16/2013 FINAL   Final  CULTURE, BLOOD (ROUTINE X 2)     Status: None   Collection Time    01/10/13  5:39 PM      Result Value Range Status   Specimen Description BLOOD HAND LEFT   Final   Special Requests BOTTLES DRAWN AEROBIC ONLY 8CC   Final   Culture  Setup Time     Final   Value: 01/10/2013 23:26     Performed at [facility]   Culture     Final   Value: NO GROWTH 5 DAYS     Performed at [facility]   Report Status 01/16/2013 FINAL   Final   Studies/Results: Dg Abd Portable 1v  01/18/2013   *RADIOLOGY REPORT*  Clinical Data: Evaluate barium.  PORTABLE ABDOMEN - 1 VIEW  Comparison: 01/10/2013  Findings: 2 portable views.  Osteopenia.  Lumbar spine degenerative disc disease at L4-L5.  No bowel obstruction.  Small bilateral pleural effusions.  Low pelvis excluded.  No residual contrast within small bowel.  There may be a small amount of residual rectal contrast identified.  Minimal dilute contrast within the ascending colon/cecum.  IMPRESSION: Minimal retained dilute contrast in the ascending colon/cecum and possibly the rectum.   Original Report Authenticated By: Jeronimo Greaves, M.D.   Medications: I have reviewed the patient's current medications. Scheduled Meds:  antiseptic oral rinse  15 mL Mouth Rinse q12n4p   budesonide-formoterol  2  puff Inhalation BID   chlorhexidine  15 mL Mouth Rinse BID   donepezil  5 mg Oral QHS   haloperidol lactate  1 mg Intravenous  Once   metoprolol  5 mg Intravenous Q6H   morphine CONCENTRATE       simvastatin  20 mg Oral q1800   vancomycin  750 mg Intravenous Q12H   Continuous Infusions:  feeding supplement (JEVITY 1.2 CAL)     heparin Stopped (01/18/13 0759)   PRN Meds:.morphine CONCENTRATE, RESOURCE THICKENUP CLEAR Assessment/Plan: Principal Problem:   HCAP (healthcare-associated pneumonia) Active Problems:   Atrial fibrillation   Hypertension   Urinary tract infection   Embolic stroke involving carotid artery   Dysphagia, unspecified(787.20)   Failure to thrive in adult   Palliative care encounter   Pain in back  Ashley Rubio is a 77 year old lady with a PMHx of atrial fibrillation, hypertension, and dementia who presents with generalized weakness and left lower lobe HCAP.   **Stroke  MRI of brain showed small, scattered right cerebral hemispheric acute infarctions, both in the MCA and PCA distributions as well as probable occlusion of the left internal carotid artery. The infarcts do not explain the entire clinical picture however they account for the left sided weakness. The family is aware of the results of the MRI.  -Appreciate neurology recommendations regarding the results of the carotid doppler, monitoring patient's blood pressure to ensure that she does not become hypotensive. -Appreciate neurology recommendations to continue IV heparin until PEG is in place. After PEG is placed, transition to apixaban (Eliquis) 5 mg BID and stop heparin.  -She has been placed on simvastatin 20 mg po. Hold until PEG is in place.  **HCAP  Likely aspiration pneumonia given her failed swallow study and the new infiltrate in the RLL. She has finished her course of aztreonam/vancomycin.   **Swollen left arm/wrist/hand  Her left arm, wrist, and hand are swollen, red, warm, and  painful. Today there is a hematoma on exam. She has sensation in the affected regions. Could be another DVT despite anticoagulation. Differential includes superficial thrombophlebitis and third spacing due to hypoalbuminemia. Doppler would not change management if positive for DVT.  -Continue heparin  **Decreased Appetite/Malnourishment  We attended the goals of care meeting with palliative care on Wednesday. The family was divided as to comfort care vs. placement of NG/PEG. No decisions were made at that time. After the palliative care family meeting, the patient told Jannette Fogo, NP, that she would like to try the PEG feeding tube.  -Check with family over the weekend to see if they want to proceed with PEG placement on Monday. -Consult nutrition for specific feeding formula  -Monitor magnesium, potassium, and phosphorous daily after placement of PEG tube (refeeding syndrome).  -Monitor weight trends, labs  **Altered Mental Status  She has been alert to person only. She has probably undergone a worsening of her underlying dementia and will not return to her baseline of living independently, and family is aware of this.  - Haldol prn for agitation  - Consider LP for possible HSV encephalitis if family wants to pursue an aggressive workup. **Atrial fibrillation/Tachycardia/Bradycardia  Patient cannot take warfarin due to swallowing difficulties.  - IV heparin  - Metoprolol IV 5 mg q 6 hrs. for rate control  **HTN  We do not want to cause hypotension given the occluded left ICA and partially occluded right ICA.  -Holding lisinopril  **Iatrogenic Hyperkalemia  Resolved.  -Monitor electrolytes and treat hyperkalemia if present  -Continue to hold lisinopril  **DVT PPX  -Heparin  This is a Psychologist, occupational Note.  The care of the patient was discussed with Dr.  Wilson and the assessment and plan formulated with their assistance.  Please see their attached note for official documentation of the  daily encounter.   LOS: 8 days   Merrie Roof, MS3 01/18/2013, 1:47 PM

## 2013-01-18 NOTE — Progress Notes (Addendum)
Patient EP:Ashley Rubio      DOB: 1936/04/13      CZY:[id number]   Palliative Medicine Team at [facility] Progress Note    Subjective: Patient more confused over past 48 hours, verbally responsive, stated " I have had enough". Remains NPO, awaiting possible PEG placement today pending INR results. Patient c/o moderate back pain.   Palliative had another family meeting with daughters, Ashley Rubio and son, Ashley Rubio. Ashley Rubio and Ashley Rubio seemed to understand that mothers overall mental and functional ability has been declining over past 2 month in the context of progressive dementia. Ashley Rubio feels that with PEG tube nutrition would enable her to regain strength. We talked at length about patient quality of life and the progressive nature of dementia. I talked with Ashley Rubio about making a decision to pursue artificial feeding decision considering mothers recent and future potential for decline.   We also talked about options if tube feeding is pursued family is opting for discharge to SNF. I recommended that [facility] Palliative Care services follow at SNF so transition to full hospice care can be facilitated.  Family was in agreement with this and if this is the path physician discharging will have to recommend this on FL-2 at discharge.  If they chose to forego insertion of feeding tube and a lot for full comfort patient can go home with hospices services and choice will have to be offered. Patient would also be eligible for residential placement if family chooses.  Family has PMT contact information if decision changes to transition to full comfort care.  Filed Vitals:   01/18/13 0600  BP: 120/68  Pulse: 103  Temp: 97.6 F (36.4 C)  Resp: 16   Physical exam: General: Ill appearing, weak, confused HEENT: buccal mucosa moist CHEST: respirations shallow, coarse rhonchi CVS: Irregular ABD: non-distended, BS audible EXT: ecchymosis on arms, L arm swollen, skin fragile NEURO:  responsive but confused  Assessment and plan: Family struggling with decision for PEG placement. Dr Andrey Campanile aware of conversation with family she will follow up  Code Status: DNR/DNI  Symptom management:   Pain/Dyspnea: Ordered Roxanol 5 mg every 6 hours as needed  Time In Time Out Total Time Spent with Patient Total Overall Time  9:00a 10:00a  20 min 60 min   Greater than 50%  of this time was spent counseling and coordinating care related to the above assessment and plan. Family conversation 40  minutes  Freddie Breech, CNS-C Palliative Medicine Team [facility] Health Team Phone: [phone] Pager: [phone]

## 2013-01-18 NOTE — Progress Notes (Signed)
NUTRITION FOLLOW UP  Intervention:   1. If PEG placed and cleared for use, recommend initiate of Jevity 1.2 @ 20 ml/hr and increase by 10 ml q 8 hr to a goal of 55 ml/hr. This will provide 1584 kcal, 73 gm protein, and 1065 ml free water.   2. Once/if feeding started recommend monitor magnesium, potassium, and phosphorus daily for at least 3 days, MD to replete as needed, as pt is at risk for refeeding syndrome given prolonged NPO and poor oral intake.   Nutrition Dx:   Inadequate oral intake related to poor appetite as evidenced by pt and family report.  Goal:   Intake to meet >/=90% estimated nutrition needs. Unmet  Monitor:   PO intake, weight trends, labs  Assessment:   Pt/family has decided on PEG placement. Was planned for today, but family now meeting with palliative again. NG tube was not placed and pt has not had any enteral nutrition. Has orders for Jevity 1.2, initiate at 20 ml/hr and advance by 10 ml q 8 hr to a goal of 55 ml/hr. Given pt prolonged NPO status and poor intake PTA, pt is at increased risk for refeeding syndrome. Recommend slow advancement of enteral nutrition and monitor Mag, Phos, and Potassium x3 days for s/s refeeding symptoms.    Height: Ht Readings from Last 1 Encounters:  01/15/13 5\' 6"  (1.676 m)    Weight Status:   Wt Readings from Last 1 Encounters:  01/16/13 133 lb 12.8 oz (60.691 kg)  down to admission weight   Re-estimated needs:  Kcal: 1550 - 1700  Protein: 65 - 75 grams  Fluid: 1.5 - 1.8 liters dailiy   Skin: Stage II L buttocks  Stage I sacrum  Skin tear L arm   Diet Order: NPO   Intake/Output Summary (Last 24 hours) at 01/18/13 0906 Last data filed at 01/18/13 0700  Gross per 24 hour  Intake    820 ml  Output    200 ml  Net    620 ml    Last BM: 8/14   Labs:   Recent Labs Lab 01/15/13 0615  01/15/13 2035 01/16/13 0400 01/17/13 0420  NA 140  < > 136 136 135  K 4.3  < > 4.0 3.9 3.7  CL 109  < > 105 104 104  CO2  24  < > 22 23 22   BUN 8  < > 8 10 13   CREATININE 0.67  < > 0.58 0.61 0.66  CALCIUM 8.8  < > 8.6 8.7 8.4  MG 1.4*  --   --  1.6 1.4*  PHOS 2.8  --   --  3.0 2.6  GLUCOSE 101*  < > 98 95 86  < > = values in this interval not displayed.  CBG (last 3)   Recent Labs  01/17/13 2359 01/18/13 0448 01/18/13 0815  GLUCAP 101* 92 93    Scheduled Meds: . antiseptic oral rinse  15 mL Mouth Rinse q12n4p  . aztreonam  2 g Intravenous Q8H  . budesonide-formoterol  2 puff Inhalation BID  . chlorhexidine  15 mL Mouth Rinse BID  . donepezil  5 mg Oral QHS  . haloperidol lactate  1 mg Intravenous Once  . metoprolol  5 mg Intravenous Q6H  . simvastatin  20 mg Oral q1800  . vancomycin  750 mg Intravenous Q12H    Continuous Infusions: . feeding supplement (JEVITY 1.2 CAL)    . heparin Stopped (01/18/13 0759)    Harvin Hazel  Gwen Pounds RD, LDN Pager [phone] After Hours pager [phone]

## 2013-01-18 NOTE — Progress Notes (Signed)
Family met with Palliative team today and are making decisions on the PEG -vs- comfort care. I have already faxed patient out to area SNF's and can continue to pursue this is PEG is placed. Will await further word on family decisions and assist as appropriate.  Reece Levy, MSW [phone]

## 2013-01-18 NOTE — Progress Notes (Signed)
ANTICOAGULATION CONSULT NOTE - Follow Up Consult  Pharmacy Consult for Heparin  Indication: Hx Afib, recent RUE DVT, and acute CVA this admit  Allergies  Allergen Reactions  . Penicillins Anaphylaxis and Swelling  . Ambien [Zolpidem Tartrate]     tachycardia    . Aspirin Nausea And Vomiting  . Codeine Itching  . Tartrazine Nausea Only    Patient Measurements: Height: 5\' 6"  (167.6 cm) Weight: 133 lb 12.8 oz (60.691 kg) IBW/kg (Calculated) : 59.3  Vital Signs: Temp: 97.8 F (36.6 C) (08/15 2032) Temp src: Oral (08/15 2032) BP: 131/105 mmHg (08/15 2032) Pulse Rate: 122 (08/15 2032)  Labs:  Recent Labs  01/16/13 0400  01/17/13 0420 01/17/13 1716 01/18/13 0843 01/18/13 2007  HGB 12.6  --  12.4  --  11.9*  --   HCT 37.7  --  36.6  --  35.5*  --   PLT 274  --  249  --  262  --   LABPROT 20.1*  --  21.2*  --  21.0*  --   INR 1.77*  --  1.90*  --  1.87*  --   HEPARINUNFRC  --   < > <0.10* <0.10* <0.10* 0.60  CREATININE 0.61  --  0.66  --  0.57  --   < > = values in this interval not displayed.  Estimated Creatinine Clearance: 56.9 ml/min (by C-G formula based on Cr of 0.57).   Medications:  Heparin @ 1200 units/hr (12 ml/hr)  Assessment: 77 y.o. F on heparin for anticoagulation in the setting of recent RUE DVT, hx Afib, and new CVA this admission while warfarin on hold for PEG tube placement. Heparin was restarted this morning after they were unable to place PEG tube today due to an INR of 1.87. Plans are to allow the INR to trend down over the weekend and re-attempt this on Monday, 8/18. The patient will likely transition to Apixaban post-PEG placement.   Heparin level this evening is slightly SUPRAtherapeutic (HL 0.6, goal of 0.3-0.5) in the setting of a recent CVA. No overt s/sx of bleeding noted at this time.  Per discussion with the nurse this evening -- the patient may need to have the IV site re-done. If the heparin drip is interrupted -- the nurse plans to call  pharmacy to let us know.   Goal of Therapy:  Heparin level 0.3-0.5 units/ml Monitor platelets by anticoagulation protocol: Yes   Plan:  1. Reduce heparin drip rate to 1150 units/hr (11.5 ml/hr) 2. Will continue to monitor for any signs/symptoms of bleeding and will follow up with heparin level in 8 hours   Georgina Pillion, PharmD, BCPS Clinical Pharmacist Pager: [phone] 01/18/2013 8:56 PM

## 2013-01-18 NOTE — Progress Notes (Signed)
ANTICOAGULATION CONSULT NOTE - Follow Up Consult  Pharmacy Consult for Heparin  Indication: H/O Afib, Recent UE DVT, recent stroke  Allergies  Allergen Reactions  . Penicillins Anaphylaxis and Swelling  . Ambien [Zolpidem Tartrate]     tachycardia    . Aspirin Nausea And Vomiting  . Codeine Itching  . Tartrazine Nausea Only    Patient Measurements: Height: 5\' 6"  (167.6 cm) Weight: 133 lb 12.8 oz (60.691 kg) IBW/kg (Calculated) : 59.3  Vital Signs: Temp: 98.1 F (36.7 C) (08/15 1000) Temp src: Oral (08/15 1000) BP: 145/70 mmHg (08/15 1000) Pulse Rate: 99 (08/15 1000)  Labs:  Recent Labs  01/16/13 0400 01/17/13 0420 01/17/13 1716 01/18/13 0843  HGB 12.6 12.4  --  11.9*  HCT 37.7 36.6  --  35.5*  PLT 274 249  --  262  LABPROT 20.1* 21.2*  --  21.0*  INR 1.77* 1.90*  --  1.87*  HEPARINUNFRC  --  <0.10* <0.10* <0.10*  CREATININE 0.61 0.66  --  0.57    Estimated Creatinine Clearance: 56.9 ml/min (by C-G formula based on Cr of 0.57).   Medications:  Heparin drip OFF this AM for PEG placement >>>unable to place PEG today due to elevated INR  Assessment: 77 y/o F on heparin drip with plans to place PEG and transition to Apixaban. Heparin was turned OFF this AM for PEG placement, but will be re-started as PEG will not be done today due to INR 1.87. CBC low and stable, renal function stable, no overt bleeding noted, will re-start at previous rate.   Goal of Therapy:  Heparin level 0.3-0.5 units/ml Monitor platelets by anticoagulation protocol: Yes   Plan:  -Re-start heparin at 1200 units/hr (order still in place) -Check 8 hour HL at 1900 -Daily CBC/HL -Monitor for bleeding -F/U PEG placement and transition to Apixaban  Thank you for allowing me to take part in this patient's care,  Abran Duke, PharmD Clinical Pharmacist Phone: [phone] Pager: [phone] 01/18/2013 10:17 AM

## 2013-01-18 NOTE — Progress Notes (Signed)
Patient with very tenuous IV access. Multiple IV nurses have evaluated her tonight. Her left arm IV is now unusable 2/2 swelling. She has 1 usable vein in her foot, but this is apparently only acceptable for blood draws and would not support an IV. She does have a usable vein in her right hand, however she has a history of UE DVT in that arm. The PICC nurse came to evaluate her, and said she is not a candidate at this time. Patient is NPO 2/2 failing a speech/swallow study. It is very important that she get her IV heparin gtt, metoprolol, and vancomycin tonight. We will therefore approve use of her right hand overnight. Options for further access will be addressed in the morning.   Vivi Barrack, MD [phone]

## 2013-01-18 NOTE — Progress Notes (Signed)
Stroke Team Progress Note  HISTORY Sonni Barse is an 77 y.o. female history of atrial fibrillation on anticoagulation (2.55 on admit), hypertension and dementia who was admitted on 01/10/2013 for management of acute pneumonia. She's had persistent altered mental status with reduced level of responsiveness. MRI was obtained on 01/15/2013 which showed multiple small cerebral infarctions involving middle and posterior cerebral artery territories on the right. 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 This was thought to probably be chronic since patient had no signs of acute left cerebral infarction. Time of onset of infarctions is unclear.Marland Kitchen She was recently hospitalized for urinary tract infection and associated encephalopathy, but subsequently improved and was discharged.  Patient was not a TPA candidate secondary to therapeutic INR. She was admitted for further evaluation and treatment.  SUBJECTIVE Family at bedside Trying to understand patients situation and decide whether or not to proceed with PEG. Patient unchanged.  OBJECTIVE Most recent Vital Signs: Filed Vitals:   01/17/13 1941 01/17/13 2007 01/18/13 0600 01/18/13 1000  BP:  135/56 120/68 145/70  Pulse:  103 103 99  Temp:  96.4 F (35.8 C) 97.6 F (36.4 C) 98.1 F (36.7 C)  TempSrc:  Axillary Oral Oral  Resp:  16 16 16   Height:      Weight:      SpO2: 96% 94% 95% 96%   CBG (last 3)   Recent Labs  01/17/13 2359 01/18/13 0448 01/18/13 0815  GLUCAP 101* 92 93    IV Fluid Intake:   . feeding supplement (JEVITY 1.2 CAL)    . heparin Stopped (01/18/13 0759)    MEDICATIONS  . antiseptic oral rinse  15 mL Mouth Rinse q12n4p  . aztreonam  2 g Intravenous Q8H  . budesonide-formoterol  2 puff Inhalation BID  . chlorhexidine  15 mL Mouth Rinse BID  . donepezil  5 mg Oral QHS  . haloperidol lactate  1 mg Intravenous Once  . metoprolol  5 mg  Intravenous Q6H  . simvastatin  20 mg Oral q1800  . vancomycin  750 mg Intravenous Q12H   PRN:  morphine CONCENTRATE, RESOURCE THICKENUP CLEAR  Diet:  NPO  Activity:  Bedrest with Bathroom privileges  DVT Prophylaxis:  IV Heparin  CLINICALLY SIGNIFICANT STUDIES Basic Metabolic Panel:   Recent Labs Lab 01/16/13 0400 01/17/13 0420 01/18/13 0843  NA 136 135 137  K 3.9 3.7 4.2  CL 104 104 104  CO2 23 22 22   GLUCOSE 95 86 99  BUN 10 13 15   CREATININE 0.61 0.66 0.57  CALCIUM 8.7 8.4 8.5  MG 1.6 1.4*  --   PHOS 3.0 2.6  --    Liver Function Tests:  No results found for this basename: AST, ALT, ALKPHOS, BILITOT, PROT, ALBUMIN,  in the last 168 hours CBC:   Recent Labs Lab 01/15/13 0821 01/16/13 0400 01/17/13 0420 01/18/13 0843  WBC 6.8 10.9* 8.3 8.3  NEUTROABS 5.0 9.2*  --   --   HGB 12.5 12.6 12.4 11.9*  HCT 37.5 37.7 36.6 35.5*  MCV 91.0 90.2 89.9 90.3  PLT 253 274 249 262   Coagulation:   Recent Labs Lab 01/15/13 0615 01/16/13 0400 01/17/13 0420 01/18/13 0843  LABPROT 23.5* 20.1* 21.2* 21.0*  INR 2.17* 1.77* 1.90* 1.87*   Cardiac Enzymes:  No results found for this basename: CKTOTAL, CKMB, CKMBINDEX, TROPONINI,  in the last 168 hours Urinalysis:  No results found for this basename: COLORURINE, APPERANCEUR,  LABSPEC, PHURINE, GLUCOSEU, HGBUR, BILIRUBINUR, KETONESUR, PROTEINUR, UROBILINOGEN, NITRITE, LEUKOCYTESUR,  in the last 168 hours Lipid Panel    Component Value Date/Time   CHOL 155 01/16/2013 0400   TRIG 115 01/16/2013 0400   HDL 28* 01/16/2013 0400   CHOLHDL 5.5 01/16/2013 0400   VLDL 23 01/16/2013 0400   LDLCALC 104* 01/16/2013 0400   HgbA1C  Lab Results  Component Value Date   HGBA1C 5.6 01/16/2013    Urine Drug Screen:   No results found for this basename: labopia,  cocainscrnur,  labbenz,  amphetmu,  thcu,  labbarb    Alcohol Level: No results found for this basename: ETH,  in the last 168 hours  Dg Chest 2 View 01/15/2013  :  1.   Cardiomegaly and pulmonary edema. 2.  Bilateral lower lobe infiltrates, left greater than right. 3.  Bilateral small effusions.    Mr Brain Wo Contrast 01/16/2013  1. Small, scattered right cerebral hemispheric acute infarctions, both in the MCA and PCA distributions.  2. Occluded (more likely) or slowly flowing left cervical and cavernous ICA. In the absence of left-sided infarction, this is likely chronic.  3.  Advanced global brain atrophy.    Dg Swallowing Func-speech Pathology 01/15/2013   Lenor Derrick, CCC-SLP     01/15/2013 12:34 PM Objective Swallowing Evaluation: Modified Barium Swallowing Study   Patient Details  Name: Myrl Bynum MRN: [id number] Date of Birth: 1936/05/04  Today's Date: 01/15/2013 Time: 1100-1234 SLP Time Calculation (min): 94 min  Past Medical History:  Past Medical History  Diagnosis Date  . Hypertension   . Unspecified venous (peripheral) insufficiency   . Personal history of malignant neoplasm of large intestine   . Cancer     colon  . Varicose veins of lower extremities with other complications    Past Surgical History:  Past Surgical History  Procedure Laterality Date  . Neck surgery  2003  . Tongue biopsy  2003  . Abdominal hysterectomy    . Breast biopsy    . Colonoscopy    . Gastric perforation repair     . Cardioversion    . Leg surgery Left    HPI:  Mrs. Sloma was recently discharged from our hospital for  encephalopathy due to presumed UTI that worsened her underlying  dementia. She was reported to becoming increasingly weak at home  with poor po intake. She was readmitted for dehydration, HCAP due  to LLL infiltrate.     Assessment / Plan / Recommendation Clinical Impression  Dysphagia Diagnosis: Moderate oral phase dysphagia;Severe oral  phase dysphagia Clinical impression: Pt. exhibits a moderate oral phase dysphagia  and severe pharyngeal dysphagia, characterized by weak lingual  movements, resulting in delayed oral transit and poor posterier  propulsion of the bolus  orally and pharyngeally; decreased  laryngeal elevation and hyoid excursion, and epiglottic  deflection during the swallow, resulting in penetration of both  nectar and honey consistencies;  and decreased base of tongue  contraction to the posterior pharyngeal wall and decreased  pharyngeal perestalsis, resulting in vallucular residue with  puree, with risk of aspiration after the swallow.  Pt. was unable  to f/c's for compensatory strategies, but finally was able to  complete a dry swallow which decreased the laryngeal stasis.   This required max verbal and tactile cues, and would not be  possible to complete after every swallow.  Pt. is at risk for  aspiration with any/all possible consistencies at this time.    Treatment Recommendation  Defer treatment plan to SLP at (Comment) (Defer plan until GOC  determined)    Diet Recommendation NPO (Until MD talks with family)        Other  Recommendations     Follow Up Recommendations  Skilled Nursing facility;24 hour supervision/assistance    Frequency and Duration        Pertinent Vitals/Pain n/a    SLP Swallow Goals     General Date of Onset: 01/04/13 HPI: Mrs. Kneece was recently discharged from our hospital for  encephalopathy due to presumed UTI that worsened her underlying  dementia. She was reported to becoming increasingly weak at home  with poor po intake. She was readmitted for dehydration, HCAP due  to LLL infiltrate. Type of Study: Modified Barium Swallowing Study Reason for Referral: Objectively evaluate swallowing function Previous Swallow Assessment: BSE 01/06/13 Diet Prior to this Study: Dysphagia 1 (puree);Nectar-thick  liquids Temperature Spikes Noted: No Respiratory Status: Room air History of Recent Intubation: No Behavior/Cognition: Alert;Distractible;Requires cueing;Doesn't  follow directions;Decreased sustained  attention;Impulsive;Confused Oral Cavity - Dentition: Dentures, top;Dentures, bottom Oral Motor / Sensory Function: Within functional limits  Self-Feeding Abilities: Total assist Patient Positioning: Upright in chair Baseline Vocal Quality: Clear Volitional Cough: Congested Volitional Swallow: Unable to elicit Anatomy: Within functional limits Pharyngeal Secretions: Not observed secondary MBS    Reason for Referral Objectively evaluate swallowing function   Oral Phase Oral Preparation/Oral Phase Oral Phase: Impaired Oral - Honey Oral - Honey Teaspoon: Right anterior bolus loss;Weak lingual  manipulation;Incomplete tongue to palate contact;Reduced  posterior propulsion;Piecemeal swallowing;Delayed oral  transit;Left anterior bolus loss Oral - Nectar Oral - Nectar Teaspoon: Left anterior bolus loss;Right anterior  bolus loss;Weak lingual manipulation;Incomplete tongue to palate  contact;Piecemeal swallowing;Delayed oral transit Oral - Nectar Cup: Left anterior bolus loss;Right anterior bolus  loss;Weak lingual manipulation;Incomplete tongue to palate  contact;Piecemeal swallowing;Delayed oral transit Oral - Solids Oral - Puree: Left anterior bolus loss;Right anterior bolus  loss;Weak lingual manipulation;Incomplete tongue to palate  contact;Lingual/palatal residue;Piecemeal swallowing;Delayed oral  transit   Pharyngeal Phase Pharyngeal Phase Pharyngeal Phase: Impaired Pharyngeal - Honey Pharyngeal - Honey Teaspoon: Reduced pharyngeal  peristalsis;Reduced epiglottic inversion;Reduced anterior  laryngeal mobility;Reduced laryngeal elevation;Reduced tongue  base retraction;Penetration/Aspiration after swallow;Compensatory  strategies attempted (Comment);Pharyngeal residue - posterior  pharnyx (Pt. unable to f/c's for compensatory strategies.) Penetration/Aspiration details (honey teaspoon): Material enters  airway, remains ABOVE vocal cords then ejected out Pharyngeal - Nectar Pharyngeal - Nectar Cup: Reduced epiglottic inversion;Reduced  anterior laryngeal mobility;Reduced laryngeal elevation;Reduced  tongue base retraction;Penetration/Aspiration during   swallow;Pharyngeal residue - valleculae Penetration/Aspiration details (nectar cup): Material enters  airway, CONTACTS cords and not ejected out Pharyngeal - Solids Pharyngeal - Puree: Delayed swallow initiation;Premature spillage  to valleculae;Reduced pharyngeal peristalsis;Reduced epiglottic  inversion;Reduced anterior laryngeal mobility;Reduced laryngeal  elevation;Reduced tongue base retraction;Pharyngeal residue -  valleculae (Double swallow difficult to obtain, but reduced  residue.)  Cervical Esophageal Phase    GO    Cervical Esophageal Phase Cervical Esophageal Phase: Vicente Masson T 01/15/2013, 12:34 PM    CT of the brain    MRA of the brain    2D Echocardiogram  EF 50%, left atrium severely dilated. Afib.  Carotid Doppler     EKG   RUE DOPPLER 01/07/2013 There is acute occlusive DVT noted in the right axillary and superficial thrombosis noted in the right basilic vein. All other veins appear thrombus free  Therapy Recommendations   Physical Exam  Frail elderly lady not in  distress. . Afebrile. Head is nontraumatic. Neck is supple without bruit.  . Cardiac exam no murmur or gallop. Lungs are clear to auscultation. Distal pulses are well felt.  Neurologic Exam : Patient is awake. Mumbles unintelligibly. Follows only simple midline commands. Has slight right gaze preference.Dolls eye movements are present. Pupils irregular reactive.  mild left lower facial weakness. Tongue is midline. Motor system exam has moderate left hemiparesis but will withdraw left upper extremity to pain. Has greater movements in the left lower extremity. Moves right upper and lower extremity greater than left but will not hold up against gravity. Sensation, coordination and gait cannot be tested reliably.  ASSESSMENT Ms. Clairissa Valvano is a 77 y.o. female presenting with acute delirium in addition to chronic altered mental status. Imaging confirms a right hemispheric infarct. Infarct felt to be  embolic secondary to unknown source.  On warfarin prior to admission for UE DVT. Now on aspirin 325 mg rectally every day for secondary stroke prevention. Patient with resultant acute delirium. Work up underway.   Hyperlipidemia, LDL 104; goal is < 100 , start statin   Hypertension  History of colon cancer  Urinary tract infection and pneumonia, on antibiotics  Atrial fibrillation history  RUE DVT 01/07/2013: on coumadin PTA, 2.55 on admit  Hospital day # 8  TREATMENT/PLAN   Patient is now off coumadin, therefore considered a failure as stroke has occurred. Patient has RUE DVT from 01/07/2013.  Patient remains NPO, aspiration risk. Has pneumonia.  Patients and family has met with palliative care and social work. Patient wants full medical care  Due to this, will pursue aggressive workup. Start zocor, check lipids/echo.  Patient had embolic event with therapeutic dose of 2.55. She was diagnosed with arm DVT 01/07/2013. Continue IV Heparin as aspirin alone will not be enough for this treatment (ASA would be enough for stroke alone however). Stop aspirin. Start heparin. Once PEG can be placed, patient can be switched to eliqius 5mg  twice a day and stop heparin at that time.  Dr. Pearlean Brownie has discussed this complex treatment plan with patient and daughter and primary care team.  Stroke team will sign off.   Gwendolyn Lima. Manson Passey, [facility], MBA, MHA [facility] Pager: [phone] 01/18/2013 10:34 AM  I have personally obtained a history, examined the patient, evaluated imaging results, and formulated the assessment and plan of care. I agree with the above.  Delia Heady, MD

## 2013-01-18 NOTE — Progress Notes (Signed)
Subjective: Ashley Rubio was seen and examined by me today.  She is still drowsy but opens her eyes to voice and will maintain eye contact.  She still remains very weak.  This morning she expressed that she was tired of the tests/procedures.  However, when asked by her daughter if she wanted to get PEG she replied in the affirmative.  The family seems to be divided on PEG vs comfort care.  Objective: Vital signs in last 24 hours: Filed Vitals:   01/18/13 0600 01/18/13 1000 01/18/13 1312 01/18/13 1709  BP: 120/68 145/70 131/85 148/89  Pulse: 103 99 101 99  Temp: 97.6 F (36.4 C) 98.1 F (36.7 C) 97.5 F (36.4 C) 98.2 F (36.8 C)  TempSrc: Oral Oral Axillary Axillary  Resp: 16 16 18 18   Height:      Weight:      SpO2: 95% 96% 98% 99%   Weight change:   Intake/Output Summary (Last 24 hours) at 01/18/13 1728 Last data filed at 01/18/13 0700  Gross per 24 hour  Intake    820 ml  Output    200 ml  Net    620 ml   General: resting in bed, somnolent Cardiac: distant HS, irregularly irregular rhythm Pulm: poor effort but coarse sounds Abd: soft, nontender, nondistended, BS present Ext: no pedal edema, upper extremity edema (L > R) Neuro: somnolent, but responding appropriately to questions   Lab Results: Basic Metabolic Panel:  Recent Labs Lab 01/16/13 0400 01/17/13 0420 01/18/13 0843  NA 136 135 137  K 3.9 3.7 4.2  CL 104 104 104  CO2 23 22 22   GLUCOSE 95 86 99  BUN 10 13 15   CREATININE 0.61 0.66 0.57  CALCIUM 8.7 8.4 8.5  MG 1.6 1.4*  --   PHOS 3.0 2.6  --    CBC:  Recent Labs Lab 01/15/13 0821 01/16/13 0400 01/17/13 0420 01/18/13 0843  WBC 6.8 10.9* 8.3 8.3  NEUTROABS 5.0 9.2*  --   --   HGB 12.5 12.6 12.4 11.9*  HCT 37.5 37.7 36.6 35.5*  MCV 91.0 90.2 89.9 90.3  PLT 253 274 249 262    Fasting Lipid Panel:  Recent Labs Lab 01/16/13 0400  CHOL 155  HDL 28*  LDLCALC 104*  TRIG 115  CHOLHDL 5.5   Coagulation:  Recent Labs Lab  01/15/13 0615 01/16/13 0400 01/17/13 0420 01/18/13 0843  LABPROT 23.5* 20.1* 21.2* 21.0*  INR 2.17* 1.77* 1.90* 1.87*   Micro Results: Recent Results (from the past 240 hour(s))  URINE CULTURE     Status: None   Collection Time    01/10/13  5:20 PM      Result Value Range Status   Specimen Description URINE, CATHETERIZED   Final   Special Requests NONE   Final   Culture  Setup Time     Final   Value: 01/10/2013 19:10     Performed at [facility] Count     Final   Value: 75,000 COLONIES/ML     Performed at [facility]   Culture     Final   Value: ENTEROCOCCUS SPECIES     Performed at [facility]   Report Status 01/12/2013 FINAL   Final   Organism ID, Bacteria ENTEROCOCCUS SPECIES   Final  CULTURE, BLOOD (ROUTINE X 2)     Status: None   Collection Time    01/10/13  5:30 PM      Result Value  Range Status   Specimen Description BLOOD ARM LEFT   Final   Special Requests BOTTLES DRAWN AEROBIC AND ANAEROBIC 10CC   Final   Culture  Setup Time     Final   Value: 01/10/2013 23:26     Performed at [facility]   Culture     Final   Value: NO GROWTH 5 DAYS     Performed at [facility]   Report Status 01/16/2013 FINAL   Final  CULTURE, BLOOD (ROUTINE X 2)     Status: None   Collection Time    01/10/13  5:39 PM      Result Value Range Status   Specimen Description BLOOD HAND LEFT   Final   Special Requests BOTTLES DRAWN AEROBIC ONLY 8CC   Final   Culture  Setup Time     Final   Value: 01/10/2013 23:26     Performed at [facility]   Culture     Final   Value: NO GROWTH 5 DAYS     Performed at [facility]   Report Status 01/16/2013 FINAL   Final   Studies/Results: Dg Abd Portable 1v  01/18/2013   *RADIOLOGY REPORT*  Clinical Data: Evaluate barium.  PORTABLE ABDOMEN - 1 VIEW  Comparison: 01/10/2013  Findings: 2 portable views.  Osteopenia.  Lumbar spine degenerative disc disease at L4-L5.  No bowel  obstruction.  Small bilateral pleural effusions.  Low pelvis excluded.  No residual contrast within small bowel.  There may be a small amount of residual rectal contrast identified.  Minimal dilute contrast within the ascending colon/cecum.  IMPRESSION: Minimal retained dilute contrast in the ascending colon/cecum and possibly the rectum.   Original Report Authenticated By: Jeronimo Greaves, M.D.   Medications: I have reviewed the patient's current medications. Scheduled Meds: . antiseptic oral rinse  15 mL Mouth Rinse q12n4p  . budesonide-formoterol  2 puff Inhalation BID  . chlorhexidine  15 mL Mouth Rinse BID  . donepezil  5 mg Oral QHS  . haloperidol lactate  1 mg Intravenous Once  . metoprolol  5 mg Intravenous Q6H  . morphine CONCENTRATE      . simvastatin  20 mg Oral q1800  . vancomycin  750 mg Intravenous Q12H   Continuous Infusions: . feeding supplement (JEVITY 1.2 CAL)    . heparin Stopped (01/18/13 0759)   PRN Meds:.morphine CONCENTRATE, RESOURCE THICKENUP CLEAR  Assessment/Plan: Ashley Rubio is a 77 year old female with past medical history of atrial fibrillation on Coumadin, digoxin and metoprolol; hypertension; and a recent hospitalization here at [facility] for urinary tract infection with altered mental status, who presents with a chief complaint of diffuse weakness and productive cough, found to have a left lower lobe infiltrate on chest x-ray and new ischemic CVA.  #New ischemic CVA - multiple infarcts of Right MCA and PCA evident on MRI.  Had discussion with family regarding poor prognosis with new stroke compounding previous issues of dementia, malnourishment/swallow dysfunction, HCAP.  Met with the family and palliative care to discuss goals of care:  Aggressive treatment (i.e. PEG) vs palliative care.  Palliative asked the patient her wishes and Ashley Rubio said that she wants the PEG.  The family has decided to move forward with PEG placement but seem to be rethinking their  decision since the PEG will likely not change the overall outcome for their mom. - Neuro recommends heparin for anticoagulation in the setting of Afib and RUE DVT; they suggest switching the  patient to Eliquis once she gets PEG.  #Health care-acquired pneumonia - Patient more listless. X-ray at admission revealed LLL infiltrate now repeat CXR shows evidence of pulmonary edema and B/L lower lobe infiltrate (L>R)  - Vanc and aztreonam trx completed - Blood cultures negative; follow-up sputum culture and gram stain   #UTI - repeat urine culture positive for enterococcus  - patient on vancomycin   #Decreased appetite - had lengthy discussion with the family about options for feeding and explained that pt is at significant risk of aspiration with any diet. Explained that PEG would be a permanent option but since the patient has dementia she could potentially try to pull the tube and encounter complications as a result. NG could be placed temporarily but the family feel the patient would be more irritated by it as she has a history of throat cancer.  The patient and family decided to move forward with PEG. - NPO diet recommended by SLP as patient failed bedside swallow eval and MBS.  - pt NPO - to IR for PEG Monday morning if family still wants it and if INR < 1.5.  #Diffuse weakness - Likely multifactorial as patient is deconditioned from multiple recent hospital stays, has infection (HCAP) and meets criteria for severe malnutrition. No signs of stroke on CT but patient now with LUE weakness.  MRI brain indicates acute right MCA and PCA infarctions.  Neurology has evaluated the patient and their recommendations are appreciated.  - HCAP trx completed  - may get PEG on Monday - Will continue to monitor electrolytes and replete as necessary  #RUE bruises/blisters - upper extremity R>L swelling and blood filled blisters on both arms likely due to anticoag.  Must continue IV heparin for secondary stroke  prevention. - protect areas of broken skin with clean dressings  #Delirium/encephalopathy - Stable since discharge. She is somnolent, non-agitated.  She probably has undergone a worsening of her underlying dementia compounded by new stroke and will not return to her baseline of living independently, and family is aware of this.   - continue Aricept  - SNF vs home after d/c  #Atrial fibrillation - Patient is subtherapeutic (1.90) today  - Heparin IV per pharmacy - Eliquis after PEG - 5mg  IV metoprolol q6h (with hold parameters), holding dig   #HTN - hold lisinopril, continue metoprolol   #COPD - Stable  - continue Symbicort inhaler   #Hyperkalemia - resolved - AM BMP   #Heart murmer - Soft holosystolic murmur best heard at the apex. History of mitral regurgitation. Afebrile. No stigmata of endocarditis on exam.  - Blood cultures negative - no vegetations on echo   #DVT PPX - IV heparin  Dispo: Disposition is deferred at this time, awaiting improvement of current medical problems.  Anticipated discharge in approximately 1-2 day(s).   The patient does have a current PCP (S Gillermo Murdoch, MD) and does need an [facility] hospital follow-up appointment after discharge.  The patient does not have transportation limitations that hinder transportation to clinic appointments.  .Services Needed at time of discharge: Y = Yes, Blank = No PT:   OT:   RN:   Equipment:   Other:     LOS: 8 days   Evelena Peat, DO 01/18/2013, 5:28 PM

## 2013-01-18 NOTE — Progress Notes (Signed)
  Date: 01/18/2013  Patient name: Ashley Rubio  Medical record number: [id number]  Date of birth: 17-Apr-1936   This patient has been seen and the plan of care was discussed with the house staff. Please see their note for complete details. I concur with their findings with the following additions/corrections:  Agree with plan as outlined by Dr. Andrey Campanile. We will continue to support discussions with family and palliative care for goals of care discussion. Patient is at high risk for subsequent devastating stroke if she has other insults that result in hypotension such as sepsis/infection. Family is still undecided whether they want to pursue having PEG. We will have patient continue on heparin gtt for now as a bridge until they decide of further goals of care.  Spent 45 min with patient with at greater than 50% coordination of care and counseling family  Judyann Munson, MD 01/18/2013, 10:20 PM

## 2013-01-19 LAB — HEPARIN LEVEL (UNFRACTIONATED)
Heparin Unfractionated: 0.92 IU/mL — ABNORMAL HIGH (ref 0.30–0.70)
Heparin Unfractionated: 1.1 IU/mL — ABNORMAL HIGH (ref 0.30–0.70)

## 2013-01-19 LAB — GLUCOSE, CAPILLARY
Glucose-Capillary: 70 mg/dL (ref 70–99)
Glucose-Capillary: 79 mg/dL (ref 70–99)
Glucose-Capillary: 72 mg/dL (ref 70–99)

## 2013-01-19 LAB — BASIC METABOLIC PANEL
Calcium: 8.4 mg/dL (ref 8.4–10.5)
Creatinine, Ser: 0.54 mg/dL (ref 0.50–1.10)
GFR calc non Af Amer: >90 mL/min (ref >90)
Sodium: 135 mEq/L (ref 135–145)

## 2013-01-19 LAB — CBC
HCT: 32.3 % — ABNORMAL LOW (ref 36.0–46.0)
Platelets: 232 10*3/uL (ref 150–400)
RDW: 14 % (ref 11.5–15.5)
WBC: 7 10*3/uL (ref 4.0–10.5)

## 2013-01-19 LAB — PROTIME-INR: Prothrombin Time: 23.3 seconds — ABNORMAL HIGH (ref 11.6–15.2)

## 2013-01-19 MED ORDER — METOPROLOL TARTRATE 1 MG/ML IV SOLN
5.0000 mg | Freq: Three times a day (TID) | INTRAVENOUS | Status: DC | PRN
  Filled 2013-01-19: qty 5

## 2013-01-19 MED ORDER — DEXTROSE-NACL 5-0.9 % IV SOLN: INTRAVENOUS | Status: DC

## 2013-01-19 MED ORDER — HEPARIN (PORCINE) IN NACL 100-0.45 UNIT/ML-% IJ SOLN
800.0000 [IU]/h | INTRAMUSCULAR | Status: DC
  Administered 2013-01-19: 800 [IU]/h via INTRAVENOUS

## 2013-01-19 MED ORDER — ATROPINE SULFATE 1 % OP SOLN
2.0000 [drp] | OPHTHALMIC | Status: DC | PRN
  Administered 2013-01-20: 4 [drp] via SUBLINGUAL
  Filled 2013-01-19: qty 2

## 2013-01-19 MED ORDER — LORAZEPAM 2 MG/ML PO CONC: 1.0000 mg | Freq: Four times a day (QID) | ORAL | Status: DC

## 2013-01-19 MED ORDER — APIXABAN 5 MG PO TABS
5.0000 mg | ORAL_TABLET | Freq: Two times a day (BID) | ORAL | Status: DC
  Filled 2013-01-19: qty 1

## 2013-01-19 MED ORDER — MORPHINE SULFATE (CONCENTRATE) 10 MG /0.5 ML PO SOLN
15.0000 mg | Freq: Once | ORAL | Status: AC
  Administered 2013-01-20: 15 mg via ORAL
  Filled 2013-01-19: qty 1

## 2013-01-19 MED ORDER — MORPHINE SULFATE (CONCENTRATE) 10 MG /0.5 ML PO SOLN: 20.0000 mg | ORAL | Status: DC | PRN

## 2013-01-19 MED ORDER — LORAZEPAM 1 MG PO TABS
1.0000 mg | ORAL_TABLET | Freq: Four times a day (QID) | ORAL | Status: DC
  Administered 2013-01-20 (×2): 1 mg via SUBLINGUAL
  Filled 2013-01-19 (×2): qty 1

## 2013-01-19 MED ORDER — METOPROLOL TARTRATE 25 MG PO TABS
25.0000 mg | ORAL_TABLET | Freq: Two times a day (BID) | ORAL | Status: DC
  Filled 2013-01-19: qty 1

## 2013-01-19 MED ORDER — LORAZEPAM 1 MG PO TABS: 1.0000 mg | ORAL_TABLET | Freq: Four times a day (QID) | ORAL | Status: DC

## 2013-01-19 MED ORDER — MORPHINE SULFATE (CONCENTRATE) 10 MG /0.5 ML PO SOLN
20.0000 mg | Freq: Four times a day (QID) | ORAL | Status: DC
  Administered 2013-01-20: 20 mg via SUBLINGUAL
  Filled 2013-01-19: qty 1

## 2013-01-19 MED ORDER — APIXABAN 2.5 MG PO TABS: 2.5000 mg | ORAL_TABLET | Freq: Two times a day (BID) | ORAL | Status: DC

## 2013-01-19 NOTE — Progress Notes (Signed)
Subjective:  - She is still drowsy but opens her eyes to voice and will maintain eye contact.   - Patient is very weak and drowsy.  She opens her eyes in responding to my questions.  - Left arm swelling improved. - Family is still undecided whether they want to pursue having PEG - No fever. - LAB:  INR 2.15; Cre=0.54 - 8/14 Carotid doppler: Preliminary report: Right: 1-39% ICA stenosis. Left: Occluded internal carotid artery. Bilateral: Vertebral artery flow is antegrade.    Objective: Vital signs in last 24 hours: Filed Vitals:   01/18/13 2025 01/18/13 2032 01/19/13 0434 01/19/13 0915  BP: 68/46 131/105 142/90 127/95  Pulse: 63 122 115 96  Temp: 98.3 F (36.8 C) 97.8 F (36.6 C) 98 F (36.7 C) 97.7 F (36.5 C)  TempSrc: Axillary Oral Oral Oral  Resp: 20 20 18 18   Height:      Weight:      SpO2: 95% 96% 95% 98%   Weight change:   Intake/Output Summary (Last 24 hours) at 01/19/13 1315 Last data filed at 01/19/13 0700  Gross per 24 hour  Intake 110.67 ml  Output      0 ml  Net 110.67 ml   General: resting in bed, somnolent Cardiac: distant HS, irregularly irregular rhythm Pulm: poor effort but coarse sounds Abd: soft, nontender, nondistended, BS present Ext: no pedal edema, upper extremity edema (L > R). L arm swelling improved.  Neuro: somnolent, but responding appropriately to questions   Lab Results: Basic Metabolic Panel:  Recent Labs Lab 01/16/13 0400 01/17/13 0420 01/18/13 0843 01/19/13 0423  NA 136 135 137 135  K 3.9 3.7 4.2 3.5  CL 104 104 104 105  CO2 23 22 22 23   GLUCOSE 95 86 99 84  BUN 10 13 15 15   CREATININE 0.61 0.66 0.57 0.54  CALCIUM 8.7 8.4 8.5 8.4  MG 1.6 1.4*  --   --   PHOS 3.0 2.6  --   --    CBC:  Recent Labs Lab 01/15/13 0821 01/16/13 0400  01/18/13 0843 01/19/13 0423  WBC 6.8 10.9*  < > 8.3 7.0  NEUTROABS 5.0 9.2*  --   --   --   HGB 12.5 12.6  < > 11.9* 11.1*  HCT 37.5 37.7  < > 35.5* 32.3*  MCV 91.0 90.2  < > 90.3  88.5  PLT 253 274  < > 262 232  < > = values in this interval not displayed.  Fasting Lipid Panel:  Recent Labs Lab 01/16/13 0400  CHOL 155  HDL 28*  LDLCALC 104*  TRIG 115  CHOLHDL 5.5   Coagulation:  Recent Labs Lab 01/16/13 0400 01/17/13 0420 01/18/13 0843 01/19/13 0700  LABPROT 20.1* 21.2* 21.0* 23.3*  INR 1.77* 1.90* 1.87* 2.15*   Micro Results: Recent Results (from the past 240 hour(s))  URINE CULTURE     Status: None   Collection Time    01/10/13  5:20 PM      Result Value Range Status   Specimen Description URINE, CATHETERIZED   Final   Special Requests NONE   Final   Culture  Setup Time     Final   Value: 01/10/2013 19:10     Performed at [facility] Count     Final   Value: 75,000 COLONIES/ML     Performed at [facility]   Culture     Final   Value: ENTEROCOCCUS SPECIES  Performed at [facility]   Report Status 01/12/2013 FINAL   Final   Organism ID, Bacteria ENTEROCOCCUS SPECIES   Final  CULTURE, BLOOD (ROUTINE X 2)     Status: None   Collection Time    01/10/13  5:30 PM      Result Value Range Status   Specimen Description BLOOD ARM LEFT   Final   Special Requests BOTTLES DRAWN AEROBIC AND ANAEROBIC 10CC   Final   Culture  Setup Time     Final   Value: 01/10/2013 23:26     Performed at [facility]   Culture     Final   Value: NO GROWTH 5 DAYS     Performed at [facility]   Report Status 01/16/2013 FINAL   Final  CULTURE, BLOOD (ROUTINE X 2)     Status: None   Collection Time    01/10/13  5:39 PM      Result Value Range Status   Specimen Description BLOOD HAND LEFT   Final   Special Requests BOTTLES DRAWN AEROBIC ONLY 8CC   Final   Culture  Setup Time     Final   Value: 01/10/2013 23:26     Performed at [facility]   Culture     Final   Value: NO GROWTH 5 DAYS     Performed at [facility]   Report Status 01/16/2013 FINAL   Final   Studies/Results: Dg Abd  Portable 1v  01/18/2013   *RADIOLOGY REPORT*  Clinical Data: Evaluate barium.  PORTABLE ABDOMEN - 1 VIEW  Comparison: 01/10/2013  Findings: 2 portable views.  Osteopenia.  Lumbar spine degenerative disc disease at L4-L5.  No bowel obstruction.  Small bilateral pleural effusions.  Low pelvis excluded.  No residual contrast within small bowel.  There may be a small amount of residual rectal contrast identified.  Minimal dilute contrast within the ascending colon/cecum.  IMPRESSION: Minimal retained dilute contrast in the ascending colon/cecum and possibly the rectum.   Original Report Authenticated By: Jeronimo Greaves, M.D.   Medications: I have reviewed the patient's current medications. Scheduled Meds: . antiseptic oral rinse  15 mL Mouth Rinse q12n4p  . budesonide-formoterol  2 puff Inhalation BID  . chlorhexidine  15 mL Mouth Rinse BID  . donepezil  5 mg Oral QHS  . haloperidol lactate  1 mg Intravenous Once  . metoprolol  5 mg Intravenous Q6H  . simvastatin  20 mg Oral q1800   Continuous Infusions: . feeding supplement (JEVITY 1.2 CAL)    . heparin 950 Units/hr (01/19/13 0625)   PRN Meds:.morphine CONCENTRATE, RESOURCE THICKENUP CLEAR  Assessment/Plan: [name] is a 77 year old female with past medical history of atrial fibrillation on Coumadin, digoxin and metoprolol; hypertension; and a recent hospitalization here at [facility] for urinary tract infection with altered mental status, who presents with a chief complaint of diffuse weakness and productive cough, found to have a left lower lobe infiltrate on chest x-ray and new ischemic CVA.  # New ischemic CVA - multiple infarcts of Right MCA and PCA evident on MRI.  Had discussion with family regarding poor prognosis with new stroke compounding previous issues of dementia, malnourishment/swallow dysfunction, and HCAP.  Palliative was consulted about the goal of care:  Aggressive treatment (i.e. PEG) vs palliative care.  Palliative asked the  patient her wishes and Ashley Rubio said that she wants the PEG.  The family has decided to move forward with PEG placement but  seem to be rethinking their decision since the PEG will likely not change the overall outcome for their mom. Family is still undecided whether they want to pursue having PEG.  - Appreciate neurology and palliative consultation in management outpatient. - Neuro recommends heparin for anticoagulation in the setting of Afib and RUE DVT; they suggest switching the patient to Eliquis once she gets PEG. - IVF: D5-NS 75 cc/h   #Health care-acquired pneumonia - X-ray at admission revealed LLL infiltrate now repeat CXR shows evidence of pulmonary edema and B/L lower lobe infiltrate (L>R)  - Vanc and aztreonam trx completed - Blood cultures negative; follow-up sputum culture and gram stain   #UTI - repeat urine culture positive for enterococcus  - patient completed vancomycin treatment which is also for pneumonia.   # Dysphagia - had lengthy discussion with the family about options for feeding and explained that pt is at significant risk of aspiration with any diet. Explained that PEG would be a permanent option but since the patient has dementia she could potentially try to pull the tube and encounter complications as a result. NG could be placed temporarily but the family feel the patient would be more irritated by it as she has a history of throat cancer.  The patient and family decided to move forward with PEG. - NPO diet recommended by SLP as patient failed bedside swallow eval and MBS.  - pt NPO - to IR for PEG Monday morning if family still wants it and if INR < 1.5. Today INR 1.87.  # Diffuse weakness - Likely multifactorial as patient is deconditioned from multiple recent hospital stays, has infection (HCAP) and meets criteria for severe malnutrition. No signs of stroke on CT but patient now with LUE weakness.  MRI brain indicates acute right MCA and PCA infarctions.   Neurology has evaluated the patient and their recommendations are appreciated.  - HCAP trx completed  - may get PEG on Monday - Will continue to monitor electrolytes and replete as necessary  #RUE bruises/blisters - upper extremity L>R  swelling and blood filled blisters on both arms likely due to anticoag.  Must continue IV heparin for secondary stroke prevention. - protect areas of broken skin with clean dressings  #Delirium/encephalopathy - She is somnolent, non-agitated.  She probably has undergone a worsening of her underlying dementia compounded by new stroke and will not return to her baseline of living independently, and family is aware of this.   - continue Aricept  - SNF vs home after d/c  #Atrial fibrillation - INR is 1.87. On IV heparin.   - Heparin IV per pharmacy - Eliquis after PEG - 5mg  IV metoprolol q6h (with hold parameters), holding dig   #HTN - hold lisinopril, continue metoprolol   #COPD - Stable  - continue Symbicort inhaler   #Hyperkalemia - resolved - AM BMP   #Heart murmer - Soft holosystolic murmur best heard at the apex. History of mitral regurgitation. Afebrile. No stigmata of endocarditis on exam.  - Blood cultures negative - no vegetations on echo   #DVT PPX - IV heparin  Dispo: Disposition is deferred at this time, awaiting improvement of current medical problems.  Anticipated discharge in approximately 1-2 day(s).   The patient does have a current PCP (S Gillermo Murdoch, MD) and does need an [facility] hospital follow-up appointment after discharge.  The patient does not have transportation limitations that hinder transportation to clinic appointments.  .Services Needed at time of discharge: Y = Yes, Blank =  No PT:   OT:   RN:   Equipment:   Other:     LOS: 9 days   Lorretta Harp, MD 01/19/2013, 1:15 PM

## 2013-01-19 NOTE — Progress Notes (Signed)
ANTICOAGULATION CONSULT NOTE - Initial Consult  Pharmacy Consult for Apixaban Indication: H/O Afib, recent stroke, recent DVT  Allergies  Allergen Reactions  . Penicillins Anaphylaxis and Swelling  . Ambien [Zolpidem Tartrate]     tachycardia    . Aspirin Nausea And Vomiting  . Codeine Itching  . Tartrazine Nausea Only    Patient Measurements: Height: 5\' 6"  (167.6 cm) Weight: 133 lb 12.8 oz (60.691 kg) IBW/kg (Calculated) : 59.3  Vital Signs: Temp: 97.5 F (36.4 C) (08/16 1730) Temp src: Oral (08/16 1730) BP: 151/73 mmHg (08/16 1730) Pulse Rate: 120 (08/16 1730)  Labs:  Recent Labs  01/17/13 0420  01/18/13 0843 01/18/13 2007 01/19/13 0423 01/19/13 0700 01/19/13 1502  HGB 12.4  --  11.9*  --  11.1*  --   --   HCT 36.6  --  35.5*  --  32.3*  --   --   PLT 249  --  262  --  232  --   --   LABPROT 21.2*  --  21.0*  --   --  23.3*  --   INR 1.90*  --  1.87*  --   --  2.15*  --   HEPARINUNFRC <0.10*  < > <0.10* 0.60  --  0.92* 1.10*  CREATININE 0.66  --  0.57  --  0.54  --   --   < > = values in this interval not displayed.  Estimated Creatinine Clearance: 56.9 ml/min (by C-G formula based on Cr of 0.54).   Medical History: Past Medical History  Diagnosis Date  . Hypertension   . Unspecified venous (peripheral) insufficiency   . Personal history of malignant neoplasm of large intestine   . Cancer     colon  . Varicose veins of lower extremities with other complications     Assessment: 77 y/o F that has stopped heparin drip and decided for full comfort care. Apixaban will cover for afib/further stroke prevention, not FDA approved for DVT treatment; MD to proceed with apixaban, discussed with Dr. Phillips Odor and rec's per stroke team note. CBC low and stable. Renal function stable. Pt qualifies for 5mg  BID dose of apixaban.  Goal of Therapy:  Monitor platelets by anticoagulation protocol: Yes   Plan:  Apixaban 5mg  PO BID CBC q72h minimum  Monitor for  bleeding Palliative care plans with transition to home soon  Thank you for allowing me to take part in this patient's care,  Abran Duke, PharmD Clinical Pharmacist Phone: [phone] Pager: [phone] 01/19/2013 6:39 PM

## 2013-01-19 NOTE — Progress Notes (Signed)
Awaiting family's decision on comfort care or peg placement. Approached by son Juanell Fairly) asking to take patient home this pm. While discussing what was needed, daughter Stanton Kidney approached and stated that after talking with Dr.Nui, they wanted to take patient home in the am. Daughter Stanton Kidney) had spoken with "Terri" ([facility]) and asked for their services. I was given "Terri's" phone number to call and get things set up for early morning transport.Terri requested info to be faxed this pm. Dr.Golding called to inform of family's decision (see note). Terri requested written prescription for pain meds due to limited availability on weekends be sent with patient.

## 2013-01-19 NOTE — Progress Notes (Signed)
Called by RN stating that family decided that the do not want to pursue PEG tube placement and that they want to focus on comfort care. They want to take her home tonight or first thing in AM with Hospice care at home.   Recommendations in order to facilitate this process:   Transition off all IV meds tonight  Begin comfort feeding and careful hand feeding--feeding tube is not medically indicated in this patient and there is no medical evidence or literature to support PEG tube in this patient.  Studies consistently demonstrate a very high mortality in older adults with advanced dementia who have feeding tubes.In observational studies, tube feeding has not been shown to prevent aspiration, heal pressure wounds, improve nutritional status, or decrease mortality in persons with end stage chronic medical diseases and dementia.  Will need script for Roxanol 10mg /ml -Give 1-2 ml SL q2 prn pain or SOB disp 30 ml no refills/HOSPICE PATIENT  Family has hospital bed-CSW will need to offer choice and arrange for ambulance transport home.  Start PO meds crushed in applesauce  Start Eloquis for further stroke prevention and DVT tx  Resumed Metorolol  Anderson Malta, DO Palliative Medicine   Please make sure patient has yellow DNR form in hand at discharge-should be put in visible place in the home.

## 2013-01-19 NOTE — Progress Notes (Addendum)
5:26 AM   Heparin       HL drawn early due to previous IV site bleeding profusely. HL 0.92. RN instructed to hold until 0600 and restart at lower rate of 950 units/hr   Will recheck HL in 8 hours.   Janice Coffin

## 2013-01-19 NOTE — Progress Notes (Signed)
Pt's daughters, son and niece were bedside when I arrived. Family was tearful but accepting that mother may pass during the night. Family wanted prayer. Provided emotional support and compassion to family. Family thankful for visit and prayer. Marjory Lies Chaplain  01/19/13 2300  Clinical Encounter Type  Visited With Family;Patient not available

## 2013-01-19 NOTE — Progress Notes (Signed)
Palliative Medicine Team Progress Note  Called by unit RN regarding acute change in patient's status. Patient with worsening mental status, volume overload with increased WOB and congestion, all IV sites lost, she continues to be on telemetry and her HR is Afib w/ RVR with HR in teh 170's. Her BP is elevated with profound diastolic hypertension suggesting volume overload and diastolic failure. Family arrived at bedside and goals of care and realistic care plan not clear at this time. Earlier today I was notified of families decision to take her home with hospice as soon as possible and desire for comfort care, however the patient has declined dramatically.  Family gathered at bedside, urgent palliative evaluation requested so I met with them this evening to discuss the patients comfort and next steps given the unexpected and acute change. Cross cover resident team notified by RN-I will contact resident team and have them check on her symptom control and also support family. Will update primary team on her condition and recommendations.   Family have elected for full comfort care they understand that her situation is critical and that she will likely not be stable enough to go home with hospice.  Will not re-start IV access  Discontinue all medications except for those related to her comfort.  Will use all SL meds as follows: Morphine 20mg  scheduled q6 and q1 prn for breakthrough pain and dyspnea.  Ativan 1mg  q4 prn for agitation and scheduled BID for agitation and also seizure prophylaxis at EOL.  Atropine opthalmic gtts 1% 2-4 drops q2 prn for upper airway secretions  No additional IV sticks or blood draws.  I do not think she will be able to discharge tomorrow-she is too unstable and may die in transport-will re-evaluate in AM if she survives through the evening.  I provided care and support to this family, will request chaplain see this family and provide support.  Time: 35 minutes.  Greater than 50%  of this time was spent counseling and coordinating care related to the above assessment and plan.  Ashley Malta, DO Palliative Medicine

## 2013-01-19 NOTE — Progress Notes (Signed)
ANTICOAGULATION CONSULT NOTE - Follow Up Consult  Pharmacy Consult for Heparin  Indication: H/O Afib, Recent UE DVT, recent stroke  Allergies  Allergen Reactions  . Penicillins Anaphylaxis and Swelling  . Ambien [Zolpidem Tartrate]     tachycardia    . Aspirin Nausea And Vomiting  . Codeine Itching  . Tartrazine Nausea Only    Patient Measurements: Height: 5\' 6"  (167.6 cm) Weight: 133 lb 12.8 oz (60.691 kg) IBW/kg (Calculated) : 59.3  Vital Signs: Temp: 98 F (36.7 C) (08/16 1320) Temp src: Axillary (08/16 1320) BP: 148/87 mmHg (08/16 1320) Pulse Rate: 110 (08/16 1320)  Labs:  Recent Labs  01/17/13 0420  01/18/13 0843 01/18/13 2007 01/19/13 0423 01/19/13 0700 01/19/13 1502  HGB 12.4  --  11.9*  --  11.1*  --   --   HCT 36.6  --  35.5*  --  32.3*  --   --   PLT 249  --  262  --  232  --   --   LABPROT 21.2*  --  21.0*  --   --  23.3*  --   INR 1.90*  --  1.87*  --   --  2.15*  --   HEPARINUNFRC <0.10*  < > <0.10* 0.60  --  0.92* 1.10*  CREATININE 0.66  --  0.57  --  0.54  --   --   < > = values in this interval not displayed.  Estimated Creatinine Clearance: 56.9 ml/min (by C-G formula based on Cr of 0.54).  Assessment: 77 y/o F on heparin drip with plans to place PEG and transition to apixaban once INR is ~1.5 for PEG placement. INR is up to 2.15 today without warfarin for days. HL is 1.10<0.92<0.60 despite no boluses and lowering rates. CBC slightly lower today, no overt bleeding noted. Renal function appears stable but may be declining.   Goal of Therapy:  Heparin level 0.3-0.5 units/ml Monitor platelets by anticoagulation protocol: Yes   Plan:  -HOLD heparin x 1 hour (spoke with RN) -Restart heparin drip at 800 units/hr -Check 8 hour HL at 0100 -Daily CBC/HL -Monitor for bleeding -F/U PEG placement and transition to Apixaban  Thank you for allowing me to take part in this patient's care,  Abran Duke, PharmD Clinical Pharmacist Phone:  [phone] Pager: [phone] 01/19/2013 4:22 PM

## 2013-01-19 NOTE — Progress Notes (Signed)
Patient's heart rate is running between 140-170.  Patient is drowsy but will open eyes.  Her right hand PIV is bleeding some around the site but flushes well.  Heparin drip d/c'd at 1945.  Her left arm is still bleeding from the old PIV from the previous night.  It has saturated the ace bandage.  Will reinforce the site.  Also, due to the patient's drowsiness, it is not safe to give the patient PO medications by mouth.  Family is in agreement.  Called Pharmacy to make aware that unable to give patient the PO Eliquis.  Also, called Dr. Delane Ginger to make aware.  She did put in order for IV PRN Metoprolol.  Will talk with family to see if they want to treat the HR per MD's request.  Also, explained that patient is difficult IV stick.  IV took several attempts to get access the previous night in the right hand.  Will continue to monitor patient.  Also, ordered and received compassion cart for the family.  Zoraida Havrilla RN, Justine Null.

## 2013-01-19 NOTE — Progress Notes (Signed)
Patient's daughter,Anglela,spoke for this nurse,they are agreed to have a picc line place on this patient.M.D on call made. aware

## 2013-01-19 NOTE — Progress Notes (Signed)
Was called by nurse about concern over bleeding from former IV site in L arm, not resolving with 15 minutes pressure. Went up to see the patient and bleeding had stopped. Evaluation of soiled towels shows ~200cc blood loss. Pt on heparin gtt. Pt is at her baseline mental status and not complaining of pain. Requesting am CBC to be drawn early.  Vivi Barrack, MD  [phone]

## 2013-01-20 MED ORDER — LORAZEPAM 2 MG/ML PO CONC: 1.0000 mg | ORAL | Status: DC

## 2013-01-20 MED ORDER — MORPHINE SULFATE (CONCENTRATE) 20 MG/ML PO SOLN: ORAL | Status: DC

## 2013-01-20 MED ORDER — MORPHINE SULFATE (CONCENTRATE) 20 MG/ML PO SOLN: 10.0000 mg | Freq: Two times a day (BID) | ORAL | Status: DC | PRN

## 2013-01-20 MED ORDER — LORAZEPAM 2 MG/ML PO CONC: ORAL | Status: AC

## 2013-01-20 MED ORDER — MORPHINE SULFATE (CONCENTRATE) 20 MG/ML PO SOLN: ORAL | Status: AC

## 2013-01-20 NOTE — Progress Notes (Signed)
   CARE MANAGEMENT NOTE February 01, 2013  Patient:  [facility]   Account Number:  [id number]  Date Initiated:  01/11/2013  Documentation initiated by:  Johny Shock  Subjective/Objective Assessment:   Pt d/c on 8/5 and readm on 01/10/13.     Action/Plan:   Await eval and decision re d/c needs. Pt active with [facility] and HHPT.   Anticipated DC Date:     Anticipated DC Plan:  HOME W HOME HEALTH SERVICES      DC Planning Services  CM consult      Choice offered to / List presented to:          [facility] arranged  HH-1 RN  HH-2 PT      [facility] agency  [facility] Care   Status of service:  Completed, signed off Medicare Important Message given?   (If response is "NO", the following Medicare IM given date fields will be blank) Date Medicare IM given:   Date Additional Medicare IM given:    Discharge Disposition:  HOME W HOME HEALTH SERVICES  Per UR Regulation:    If discussed at Long Length of Stay Meetings, dates discussed:    Comments:  Feb 01, 2013 1100 Unit Coordinator did arrange dc home with Hospice on 01/19/2013. [facility] did accept referral and aware of pt's condition. Scheduled soc 02-01-13. Isidoro Donning RN CCM Case Mgmt phone [phone]

## 2013-01-20 NOTE — Clinical Social Work Placement (Signed)
     Clinical Social Work Department CLINICAL SOCIAL WORK PLACEMENT NOTE 2013-02-09  Patient:  Ashley Rubio, Ashley Rubio  Account Number:  [id number] Admit date:  01/10/2013  Clinical Social Worker:  Robin Searing  Date/time:  01/16/2013 02:29 PM  Clinical Social Work is seeking post-discharge placement for this patient at the following level of care:   SKILLED NURSING   (*CSW will update this form in Epic as items are completed)   01/16/2013  Patient/family provided with [facility] Department of Clinical Social Works list of facilities offering this level of care within the geographic area requested by the patient (or if unable, by the patients family).  01/16/2013  Patient/family informed of their freedom to choose among providers that offer the needed level of care, that participate in Medicare, Medicaid or managed care program needed by the patient, have an available bed and are willing to accept the patient.  01/16/2013  Patient/family informed of [facility] ownership interest in [facility], as well as of the fact that they are under no obligation to receive care at this facility.  PASARR submitted to EDS on 01/16/2013 PASARR number received from EDS on 01/16/2013  FL2 transmitted to all facilities in geographic area requested by pt/family on  01/16/2013 FL2 transmitted to all facilities within larger geographic area on   Patient informed that his/her managed care company has contracts with or will negotiate with  certain facilities, including the following:     Patient/family informed of bed offers received:   Patient chooses bed at  Physician recommends and patient chooses bed at    Patient to be transferred to  on  February 09, 2013 Patient to be transferred to facility by Ambulance  [facility])  The following physician request were entered in Epic:   Additional Comments: 02-09-13  OK per MD for d/c today to home. Hospice will follow patient. CSW arranged  for EMS transport. Family is aware and wanted to take patient home. CSW - Ervin Knack discussed with patient's nurse. No further CSW needs identified. CSW signing off.  Lorri Frederick. Deby Adger, LCSWA  [phone]

## 2013-01-20 NOTE — Progress Notes (Signed)
Family is refusing Foley Catheter insertion at this time.  Will reassess as the night progresses.  Will continue to support the family.  Alexica Schlossberg RN, Justine Null

## 2013-01-20 NOTE — Progress Notes (Signed)
Spoke with family Juanell Fairly, Stanton Kidney) re: patient's drop in blood pressure since 2208 last night (150/115) to (67/24). Family wishes to still attempt transport home and is aware that patient may not survive transport. Dr. Clyde Lundborg and Dr. Phillips Odor aware and are proceeding with discharge. SW notified, forms completed and ambulance called. Spoke with Terri from [facility] and notified of discharge and that patient is actively dying. CM notified.Prescriptions given to family.

## 2013-01-20 NOTE — Progress Notes (Signed)
Clinical Child psychotherapist (CSW) was contacted by Charity fundraiser Synetta Fail) [phone] regarding non-emergent EMS transport for patient to go home with [facility]. RN reported that she and the family had contacted Terri from [facility] and faxed the necessary documents. RN reported that [facility] had accepted the patient and the family wanted to take the patient home today. CSW completed EMS transport form and put it on shadow chart along with a facesheet and a signed DNR form. CSW instructed RN to give these three documents to EMS and to follow up with RN case manager Cathlean Cower). CSW also left a message for RN case manager. There are no other social work needs CSW signing off.  Jetta Lout, LCSWA Weekend CSW [phone]

## 2013-01-20 NOTE — Discharge Summary (Signed)
Patient Name:  Ashley Rubio  MRN: [id number]  PCP: Luna Fuse, MD  DOB:  02/03/1936       Date of Admission:  01/10/2013  Date of Discharge:  Feb 10, 2013      Attending Physician: Dr. Judyann Munson, MD      DISCHARGE DIAGNOSES:   Principal Problem: Hospice care   Active Problems:    Atrial fibrillation    Hypertension    Urinary tract infection    Embolic stroke involving carotid artery    Dysphagia, unspecified(787.20)    Failure to thrive in adult    Palliative care encounter    Pain in back    HCAP (healthcare-associated pneumonia)   DISPOSITION AND FOLLOW-UP: Patient is discharged home. She will start hospice care. No follow up appointment in clinic.   DISCHARGE MEDICATIONS:   Medication List    STOP taking these medications       budesonide-formoterol 80-4.5 MCG/ACT inhaler  Commonly known as:  SYMBICORT     digoxin 0.25 MG tablet  Commonly known as:  LANOXIN     donepezil 5 MG tablet  Commonly known as:  ARICEPT     haloperidol 2 MG tablet  Commonly known as:  HALDOL     lisinopril 10 MG tablet  Commonly known as:  PRINIVIL,ZESTRIL     metoprolol 50 MG tablet  Commonly known as:  LOPRESSOR     warfarin 2 MG tablet  Commonly known as:  COUMADIN      TAKE these medications       LORazepam 2 MG/ML concentrated solution  Commonly known as:  ATIVAN  1 ml q2h sublingual for agitation prn     morphine 20 MG/ML concentrated solution  Commonly known as:  ROXANOL  1 ml, q1h prn, sublingual for pain or dyspnea.        CONSULTS: Neurology and palliative care  PROCEDURES PERFORMED:  Dg Chest 2 View  01/15/2013   *RADIOLOGY REPORT*  Clinical Data: Cough, coarse breath sounds.  Somnolence. Fatigue.  CHEST - 2 VIEW  Comparison: 01/10/2013  Findings: Heart is enlarged.  There is dense opacity at the left lung base, obscuring the left hemidiaphragm.  Patchy density at the right lung base has increased, consistent with infiltrate.  Bilateral pleural effusions are noted.  There are interstitial changes of pulmonary edema.  IMPRESSION:  1.  Cardiomegaly and pulmonary edema. 2.  Bilateral lower lobe infiltrates, left greater than right. 3.  Bilateral small effusions.   Original Report Authenticated By: Norva Pavlov, M.D.   Dg Chest 2 View  01/10/2013   *RADIOLOGY REPORT*  Clinical Data: Chest pain, hypertension  CHEST - 2 VIEW  Comparison: 01/04/2013  Findings: Minimal enlargement of cardiac silhouette. Rotation to the left. Mediastinal contours and pulmonary vascularity normal. Atherosclerotic calcification aorta. New small bilateral pleural effusions. Additionally there is consolidation in the left lower lobe, new. Question minimal infiltrate right mid lung. No pneumothorax. Bones demineralized.  IMPRESSION: Mild enlargement of cardiac silhouette. Bibasilar effusions with left lower lobe infiltrate and question minimal right mid lung infiltrate.   Original Report Authenticated By: Ulyses Southward, M.D.   Dg Chest 2 View  01/04/2013   *RADIOLOGY REPORT*  Clinical Data: Dehydration.  CHEST - 2 VIEW  Comparison: None.  Findings: The heart is borderline enlarged.  The mediastinal and hilar contours are within normal limits.  A probable chronic bronchitic type interstitial lung changes but no definite acute infiltrates or pleural effusion.  Low lung volumes with vascular  crowding and streaky atelectasis.  The bony thorax is intact.  IMPRESSION: Mild cardiac enlargement. Low lung volumes with vascular crowding and atelectasis. Probable underlying chronic bronchitic type lung changes.   Original Report Authenticated By: Rudie Meyer, M.D.   Ct Head Wo Contrast  01/07/2013   *RADIOLOGY REPORT*  Clinical Data: Altered mental status, delirium, insomnia, currently being treated for UTI, history hypertension  CT HEAD WITHOUT CONTRAST  Technique:  Contiguous axial images were obtained from the base of the skull through the vertex without contrast.   Comparison: None  Findings: Generalized atrophy. Normal ventricular morphology. No midline shift or mass effect. Small vessel chronic ischemic changes of deep cerebral white matter. No intracranial hemorrhage, mass lesion, or acute infarction. Visualized paranasal sinuses and mastoid air cells clear. Bones unremarkable. Atherosclerotic calcification at carotid siphons.  IMPRESSION: Atrophy with small vessel chronic ischemic changes of deep cerebral white matter. No acute intracranial abnormalities.   Original Report Authenticated By: Ulyses Southward, M.D.   Mr Brain Wo Contrast  01/16/2013   *RADIOLOGY REPORT*  Clinical Data: Weakness and somnolence.  Rule out stroke.  MRI HEAD WITHOUT CONTRAST  Technique:  Multiplanar, multiecho pulse sequences of the brain and surrounding structures were obtained according to standard protocol without intravenous contrast.  Comparison: Head CT 01/08/2011.  Findings:  Calvarium and upper cervical spine: No marrow signal abnormality. Degenerative disc and facet disease with C3-4 retrolisthesis and C4- 5 anterolisthesis.  Orbits: No significant findings.  Sinuses: Clear. Left mastoid effusion, subtotal.  Brain: Patchy acute infarctions in the right caudate body and subcortical high insular region, extending into the right corona radiata.  There are small ( sub centimeter) cortical infarcts in the right parietal and occipital lobes.  Absent flow related signal loss within the left cervical and cavernous carotid, with reconstitution at the level of the A1 and M1 segments.  Global atrophy, without lobar predominance. Scattered cerebral white matter T2 hyperintense signal abnormalities, usually related to chronic small vessel ischemia.  Ex vacuo ventricular enlargement without hydrocephalus.  No acute hemorrhage or evidence of mass lesion.  Critical Value/emergent results were called by telephone at the time of interpretation on 01/16/2013 at 01:45 to Dr Claudell Kyle, who verbally acknowledged  these results.  IMPRESSION:  1. Small, scattered right cerebral hemispheric acute infarctions, both in the MCA and PCA distributions.  2. Occluded (more likely) or slowly flowing left cervical and cavernous ICA. In the absence of left-sided infarction, this is likely chronic.  3.  Advanced global brain atrophy.   Original Report Authenticated By: Tiburcio Pea   US Abdomen Complete  01/10/2013   *RADIOLOGY REPORT*  Clinical Data:  Right upper quadrant abdominal pain.  History of colon cancer.  COMPLETE ABDOMINAL ULTRASOUND  Comparison:  No priors.  Findings:  Gallbladder:  No shadowing gallstones or echogenic sludge.  No gallbladder wall thickening or pericholecystic fluid.  Negative sonographic Murphy's sign according to the ultrasound technologist.  Common bile duct:  Normal caliber measuring 4.9 mm in the porta hepatis.  Liver:  No focal mass lesion seen.  Within normal limits in parenchymal echogenicity.  IVC:  Patent throughout its visualized course in the abdomen.  Pancreas:  Although the pancreas is difficult to visualize in its entirety, no focal pancreatic abnormality is identified.  Spleen:  Normal size and echotexture without focal parenchymal abnormality.  6.6 cm in length.  Right Kidney:  No hydronephrosis.  Well-preserved cortex.  Normal size and parenchymal echotexture without focal abnormalities. 10.8 cm in length.  Left Kidney:  No hydronephrosis.  Well-preserved cortex.  Normal size and parenchymal echotexture without focal abnormalities. 9.7 cm in length.  Abdominal aorta:  Largely obscured by bowel gas distally, but normal caliber proximally measuring up to 2.0 cm in diameter.  Additional findings:  Bilateral pleural effusions incompletely evaluated.  IMPRESSION: 1.  No acute findings to account for the patient's symptoms. Specifically, no evidence of gallstones or findings to suggest acute cholecystitis at this time. 2.  Bilateral pleural effusions incidentally noted.   Original Report  Authenticated By: Trudie Reed, M.D.   Ct Abdomen Pelvis W Contrast  01/10/2013   *RADIOLOGY REPORT*  Clinical Data: Weakness.  Nausea, vomiting and abdominal pain.  CT ABDOMEN AND PELVIS WITH CONTRAST  Technique:  Multidetector CT imaging of the abdomen and pelvis was performed following the standard protocol during bolus administration of intravenous contrast.  Contrast: 80mL OMNIPAQUE IOHEXOL 300 MG/ML  SOLN  Comparison: No priors.  Findings:  Lung Bases: Moderate bilateral pleural effusions with associated passive atelectasis in the lower lobes of the lungs bilaterally. Peripheral ground-glass attenuation and some subpleural reticulation in the visualized lung bases.  A few thick-walled cysts are noted in the right lower lobe.  Small volume of pericardial fluid.  No associated pericardial calcification in the visualized portions of the thorax.  Heart size appears borderline enlarged.  Atherosclerotic calcification and in the right coronary artery.  There is a suggestion of a small filling defects in the left lateral aspect of the left atrium on image 1 of series 2, however, this is the first image of the study and this is incompletely visualized.  Abdomen/Pelvis:  The appearance of the liver, gallbladder, spleen and bilateral adrenal glands is unremarkable.  Pancreatic atrophy. No focal pancreatic lesions are noted.  Multifocal cortical thinning throughout the kidneys bilaterally, compatible with post infectious or inflammatory scarring.  Multiple subcentimeter low attenuation lesions are noted in the kidneys bilaterally but are too small to definitively characterize.  Extensive atherosclerosis throughout the abdominal and pelvic vasculature, without definite aneurysm or dissection.  Presacral fluid and thickening is of uncertain etiology and significance, but may reflect edema and/or inflammation.  Large amount of well formed stool in the distal rectum.  The more proximal colon is otherwise relatively  decompressed, and the overall stone burden appears normal.  Urinary bladder is completely decompressed with Foley balloon catheter in place.  Status post hysterectomy.  Ovaries are atrophic.  No significant volume of ascites.  No pneumoperitoneum. No pathologic distension of small bowel.  No definite pathologic lymphadenopathy identified within the abdomen or pelvis on today's examination.  Musculoskeletal: There are no aggressive appearing lytic or blastic lesions noted in the visualized portions of the skeleton. Multilevel degenerative disc disease, most severe at L3-L4.  IMPRESSION:  1.  Large volume of well formed stool in the distal rectum.  This is nonspecific, but can be seen in the setting of fecal impaction. Given the lack of the more proximal colonic distension and overall normal stool burden, this is favored to be an incidental finding but clinical correlation is recommended, as there is a large amount of presacral fluid and thickening which may indicate some edema and/or inflammation. 2.  No other potential acute findings in the abdomen or pelvis to account for the patient's symptoms. 3.  Extensive atherosclerosis, including right coronary artery disease. 4.  Image #1 demonstrates a potential filling defect in the lateral aspect of the left atrium immediately posterior to the left atrioventricular groove.  This patient with history of  atrial fibrillation, clinical correlation is recommended with consideration for further evaluation with echocardiography to exclude intracardiac thrombus. 5.  Moderate bilateral pleural effusions with associated passive atelectasis throughout the lower lobes of the lungs bilaterally. 6.  Unusual appearance of the lung parenchyma, as above, which could be indicative of an interstitial lung disease.  Follow-up high-resolution chest CT after the patient's acute illness has resolved is suggested to better assess these findings and characterize this potential interstitial lung  disease. 7.  Small amount of pericardial fluid, unlikely to be of hemodynamic significance at this time. 8.  Additional incidental findings, as above.   Original Report Authenticated By: Trudie Reed, M.D.   Dg Abd Portable 1v  01/18/2013   *RADIOLOGY REPORT*  Clinical Data: Evaluate barium.  PORTABLE ABDOMEN - 1 VIEW  Comparison: 01/10/2013  Findings: 2 portable views.  Osteopenia.  Lumbar spine degenerative disc disease at L4-L5.  No bowel obstruction.  Small bilateral pleural effusions.  Low pelvis excluded.  No residual contrast within small bowel.  There may be a small amount of residual rectal contrast identified.  Minimal dilute contrast within the ascending colon/cecum.  IMPRESSION: Minimal retained dilute contrast in the ascending colon/cecum and possibly the rectum.   Original Report Authenticated By: Jeronimo Greaves, M.D.   Dg Swallowing Func-speech Pathology  01/15/2013   Lenor Derrick, CCC-SLP     01/15/2013 12:34 PM Objective Swallowing Evaluation: Modified Barium Swallowing Study   Patient Details  Name: Ashley Rubio MRN: [id number] Date of Birth: [birth year]  Today's Date: 01/15/2013 Time: 4098-1191 SLP Time Calculation (min): 94 min  Past Medical History:  Past Medical History  Diagnosis Date  . Hypertension   . Unspecified venous (peripheral) insufficiency   . Personal history of malignant neoplasm of large intestine   . Cancer     colon  . Varicose veins of lower extremities with other complications    Past Surgical History:  Past Surgical History  Procedure Laterality Date  . Neck surgery  2003  . Tongue biopsy  2003  . Abdominal hysterectomy    . Breast biopsy    . Colonoscopy    . Gastric perforation repair     . Cardioversion    . Leg surgery Left    HPI:  Ashley Rubio was recently discharged from our hospital for  encephalopathy due to presumed UTI that worsened her underlying  dementia. She was reported to becoming increasingly weak at home  with poor po intake. She was readmitted for  dehydration, HCAP due  to LLL infiltrate.     Assessment / Plan / Recommendation Clinical Impression  Dysphagia Diagnosis: Moderate oral phase dysphagia;Severe oral  phase dysphagia Clinical impression: Pt. exhibits a moderate oral phase dysphagia  and severe pharyngeal dysphagia, characterized by weak lingual  movements, resulting in delayed oral transit and poor posterier  propulsion of the bolus orally and pharyngeally; decreased  laryngeal elevation and hyoid excursion, and epiglottic  deflection during the swallow, resulting in penetration of both  nectar and honey consistencies;  and decreased base of tongue  contraction to the posterior pharyngeal wall and decreased  pharyngeal perestalsis, resulting in vallucular residue with  puree, with risk of aspiration after the swallow.  Pt. was unable  to f/c's for compensatory strategies, but finally was able to  complete a dry swallow which decreased the laryngeal stasis.   This required max verbal and tactile cues, and would not be  possible to complete after every swallow.  Pt. is at risk for  aspiration with any/all possible consistencies at this time.    Treatment Recommendation  Defer treatment plan to SLP at (Comment) (Defer plan until GOC  determined)    Diet Recommendation NPO (Until MD talks with family)        Other  Recommendations     Follow Up Recommendations  Skilled Nursing facility;24 hour supervision/assistance    Frequency and Duration        Pertinent Vitals/Pain n/a    SLP Swallow Goals     General Date of Onset: 01/04/13 HPI: Ashley Rubio was recently discharged from our hospital for  encephalopathy due to presumed UTI that worsened her underlying  dementia. She was reported to becoming increasingly weak at home  with poor po intake. She was readmitted for dehydration, HCAP due  to LLL infiltrate. Type of Study: Modified Barium Swallowing Study Reason for Referral: Objectively evaluate swallowing function Previous Swallow Assessment: BSE 01/06/13  Diet Prior to this Study: Dysphagia 1 (puree);Nectar-thick  liquids Temperature Spikes Noted: No Respiratory Status: Room air History of Recent Intubation: No Behavior/Cognition: Alert;Distractible;Requires cueing;Doesn't  follow directions;Decreased sustained  attention;Impulsive;Confused Oral Cavity - Dentition: Dentures, top;Dentures, bottom Oral Motor / Sensory Function: Within functional limits Self-Feeding Abilities: Total assist Patient Positioning: Upright in chair Baseline Vocal Quality: Clear Volitional Cough: Congested Volitional Swallow: Unable to elicit Anatomy: Within functional limits Pharyngeal Secretions: Not observed secondary MBS    Reason for Referral Objectively evaluate swallowing function   Oral Phase Oral Preparation/Oral Phase Oral Phase: Impaired Oral - Honey Oral - Honey Teaspoon: Right anterior bolus loss;Weak lingual  manipulation;Incomplete tongue to palate contact;Reduced  posterior propulsion;Piecemeal swallowing;Delayed oral  transit;Left anterior bolus loss Oral - Nectar Oral - Nectar Teaspoon: Left anterior bolus loss;Right anterior  bolus loss;Weak lingual manipulation;Incomplete tongue to palate  contact;Piecemeal swallowing;Delayed oral transit Oral - Nectar Cup: Left anterior bolus loss;Right anterior bolus  loss;Weak lingual manipulation;Incomplete tongue to palate  contact;Piecemeal swallowing;Delayed oral transit Oral - Solids Oral - Puree: Left anterior bolus loss;Right anterior bolus  loss;Weak lingual manipulation;Incomplete tongue to palate  contact;Lingual/palatal residue;Piecemeal swallowing;Delayed oral  transit   Pharyngeal Phase Pharyngeal Phase Pharyngeal Phase: Impaired Pharyngeal - Honey Pharyngeal - Honey Teaspoon: Reduced pharyngeal  peristalsis;Reduced epiglottic inversion;Reduced anterior  laryngeal mobility;Reduced laryngeal elevation;Reduced tongue  base retraction;Penetration/Aspiration after swallow;Compensatory  strategies attempted (Comment);Pharyngeal  residue - posterior  pharnyx (Pt. unable to f/c's for compensatory strategies.) Penetration/Aspiration details (honey teaspoon): Material enters  airway, remains ABOVE vocal cords then ejected out Pharyngeal - Nectar Pharyngeal - Nectar Cup: Reduced epiglottic inversion;Reduced  anterior laryngeal mobility;Reduced laryngeal elevation;Reduced  tongue base retraction;Penetration/Aspiration during  swallow;Pharyngeal residue - valleculae Penetration/Aspiration details (nectar cup): Material enters  airway, CONTACTS cords and not ejected out Pharyngeal - Solids Pharyngeal - Puree: Delayed swallow initiation;Premature spillage  to valleculae;Reduced pharyngeal peristalsis;Reduced epiglottic  inversion;Reduced anterior laryngeal mobility;Reduced laryngeal  elevation;Reduced tongue base retraction;Pharyngeal residue -  valleculae (Double swallow difficult to obtain, but reduced  residue.)  Cervical Esophageal Phase    GO    Cervical Esophageal Phase Cervical Esophageal Phase: Vicente Masson T 01/15/2013, 12:34 PM      ADMISSION DATA:  History of Present Illness:  Ashley Rubio is a 77 year old female with past medical history of atrial fibrillation on Coumadin, digoxin and metoprolol; hypertension; and recent hospitalization here at [facility] for urinary tract infection with altered mental status, who presents today with a chief complaint of weakness. History was obtained from the patient and her  son, who lives with her.   Ms. Ayllon was recently admitted to our service on 01/04/13 for altered mental status in the setting of positive UA and elevated digoxin level. She had 2 prior hospitalizations and a stay at SNF in the month prior to this admission. During the initial hospitalization, she was exhibiting delirium and Aricept and Haldol were added to her home meds. Per family, two years ago she was told she was in the early stages of dementia. Since then they have specifically noted issues with her executive  functioning, such as forgetting to pay her bills and having trouble using her household thermostat. She would also occasionally get confused and agitated, especially during snowstorms this winter. However, she lived alone and was independent in most of her ADLs prior to June. Since her hospitalizations they have noted a more dramatic decline in her mental status and level of functioning. She has been largely bed bound, has not returned to her baseline mental status, and has had periods of aggression and confusion, particularly at night.   Last admission her UTI was treated with with Levaquin (4 days) and Digoxin was stopped in the hospital. CT scan was negative for acute intracranial abnormality. INR at admission was subtherapeutic at 1.44 on coumadin, however it improved to 2.05 and Lovenox was added to bridge. She developed RUE swelling on hospital day 4 and RUE duplex revealed a DVT. No intervention was deemed necessary as she was already receiving Coumadin and Lovenox and had an improving INR. She was discharged to her son and daughter-in-law's home.   Since discharge the family has noted no change or improvement in her mental status. She remains confused and disoriented, but alert and non-agitated. The family brought her in today with a chief complaint of diffuse weakness. She has been unable to leave the bed without assistance since coming home. She endorses mild nausea and decreased appetite; she has not been eating or drinking except for sips to take her medications (which she has been taking as prescribed). She denies abdominal pain, vomiting, diarrhea. Last bowel movement was yesterday. Per the patient's family she has also been complaining of sore throat and productive cough, both present since discharge. The son notes she has also complained of some back and right upper flank pain with transfers, though she does not endorse this. No chest pain, headache, SOB, fevers.   In the emergency department,  chest x-ray was performed which suggested left lower lobe infiltrate. Vancomycin was started for treatment of hospital acquired pneumonia. An abdominal US was ordered which showed no acute abdominal findings but incidental bilateral pleural effusions. CT chest/abdomen showed a large volume of well formed stool in the distal rectum, though to be incidental but possibly 2/2 fecal impaction; potential filling defect in left atrium that could represent an intracardiac thrombus; moderate bilateral pleural effusions with associated passive atelectasis in the lower lung lobes; and finally suggestion of interstitial lung disease with recommendation for outpatient high-resolution chest CT. Her INR is 2.73. WBC count is 10.3. Lactate within normal limits. Digoxin level 1.0, within normal limits. Mild hypokalemia (k=3.4) on BMP. Negative troponin.  Physical Exam: Blood pressure 124/74, pulse 85, temperature 97.5 F (36.4 C), temperature source Oral, resp. rate 20, height 5\' 6"  (1.676 m), weight 141 lb 15.6 oz (64.4 kg), SpO2 98.00%.  Physical Exam  Constitutional:  Chronically ill-appearing, sunken face, no acute distress.  HENT:  Mouth/Throat: Mucous membranes are dry.  Crusted secretions in corners of mouth.  Eyes: Conjunctivae and  EOM are normal. Pupils are equal, round, and reactive to light.  Neck: Normal range of motion. Neck supple.  Cardiovascular: An irregularly irregular rhythm present.  Murmur heard.  Systolic murmur is present with a grade of 2/6  Holosystolic murmur best heard at the apex. No lower extremity edema.  Pulmonary/Chest: Effort normal. No accessory muscle usage. Not tachypneic. No respiratory distress. She has rales in the left lower field.  Exam limited by poor effort, inability to sit up or turn in bed for a complete auscultation.  Abdominal: Soft. Normal appearance and bowel sounds are normal. There is no tenderness. There is no rigidity, no rebound, no guarding and no CVA  tenderness.  Musculoskeletal: She exhibits no edema.  Neurological: She is alert. She displays weakness (Diffuse). No cranial nerve deficit.  Reflex Scores:  Bicep reflexes are 2+ on the right side and 2+ on the left side. Alert. Oriented to self only. Responding to all questions, but often inappropriately. GCS of 14, losing 1 point for orientation. No focal sensory or motor deficits, but diffusely weak.  Skin: Skin is warm and dry. Nails show no clubbing.  Pressure ulcer stage 1 in the sacrum. Clean, dry, no erythema.   Lab results: Basic Metabolic Panel:  Recent Labs   01/08/13 0500  01/10/13 1524   NA  139  139   K  4.7  3.4*   CL  109  102   CO2  20  23   GLUCOSE  76  82   BUN  7  14   CREATININE  0.51  0.68   CALCIUM  7.6*  8.5    Liver Function Tests:   Recent Labs   01/10/13 1524   AST  18   ALT  11   ALKPHOS  96   BILITOT  0.9   PROT  5.9*   ALBUMIN  2.3*    CBC:   Recent Labs   01/08/13 0500  01/10/13 1524   WBC  7.0  10.3   NEUTROABS  5.5  8.6*   HGB  12.1  13.5   HCT  35.8*  39.6   MCV  91.3  90.4   PLT  203  228    Cardiac Enzymes:   Recent Labs   01/10/13 1524   TROPONINI  <0.30    Coagulation:   Recent Labs   01/08/13 0500  01/10/13 1524   LABPROT  22.5*  28.0*   INR  2.05*  2.73*    Urinalysis:   Recent Labs   01/10/13 1720   COLORURINE  AMBER*   LABSPEC  1.017   PHURINE  5.5   GLUCOSEU  NEGATIVE   HGBUR  SMALL*   BILIRUBINUR  LARGE*   KETONESUR  40*   PROTEINUR  30*   UROBILINOGEN  1.0   NITRITE  NEGATIVE   LEUKOCYTESUR  MODERATE*    Misc. Labs:  Digoxin Level   Date  Value  Range  Status   01/10/2013  1.0  0.8 - 2.0 ng/mL  Final    Lactic Acid, Venous   Date  Value  Range  Status   01/10/2013  1.62  0.5 - 2.2 mmol/L  Final     HOSPITAL COURSE:  Ashley Rubio is a 77 year old female with past medical history of atrial fibrillation on Coumadin, digoxin and metoprolol; hypertension; and a recent hospitalization at  [facility] for urinary tract infection with altered mental status, who was admitted again for diffuse weakness and  productive cough on 8/7.  During her admission, she was found to have a left lower lobe infiltrate on chest x-ray and .  Following admission, initial treatment was started for HCAP and UTI.  An acute ischemic stroke was found on MRI and neurology was consulted.  The patient had been declining medically during the hospital course.  She did not pass swallowing evaluations and was made NPO while waiting for the family's decision about feeding tube placement.  Palliative care was consulted and the patient's family members were provided with information and support.  After extensive discussion the family decided on hospice care on 8/16.  Ms. Rubio was treated with morphine for best pain control; with Ativan for agitation; with atropine for airway secretions and with Biotene solution for oral care.  She seemed to be comfortable in hospital.   Ashley Rubio was discharged home on 08/17 and will receive home hospice care.  Hospice care referral was given to her family.   Code Status: DNR/DNI  DISCHARGE DATA: Vital Signs: BP 67/24  Pulse 113  Temp(Src) 99.4 F (37.4 C) (Axillary)  Resp 20  Ht 5\' 6"  (1.676 m)  Wt 133 lb 12.8 oz (60.691 kg)  BMI 21.61 kg/m2  SpO2 86%  Labs: Results for orders placed during the hospital encounter of 01/10/13 (from the past 24 hour(s))  GLUCOSE, CAPILLARY     Status: None   Collection Time    01/19/13 11:35 AM      Result Value Range   Glucose-Capillary 79  70 - 99 mg/dL   Comment 1 Documented in Chart     Comment 2 Notify RN    HEPARIN LEVEL (UNFRACTIONATED)     Status: Abnormal   Collection Time    01/19/13  3:02 PM      Result Value Range   Heparin Unfractionated 1.10 (*) 0.30 - 0.70 IU/mL  GLUCOSE, CAPILLARY     Status: None   Collection Time    01/19/13  5:08 PM      Result Value Range   Glucose-Capillary 72  70 - 99 mg/dL   Comment 1  Documented in Chart     Comment 2 Notify RN       Services Ordered on Discharge: Y = Yes; Blank = No PT:   OT:   RN:   Equipment:   Other:      Time Spent on Discharge: 35 min   Signed: Lorretta Harp, MD PGY 2, Internal Medicine Resident 01-23-2013, 9:34 AM

## 2013-01-21 NOTE — Progress Notes (Signed)
Agree with findings by Student Doctor Yetta Barre.

## 2013-01-21 NOTE — Progress Notes (Signed)
Agree with findings by Student Doctor Jones. 

## 2013-02-04 NOTE — Discharge Summary (Signed)
  Date: 02/04/2013  Patient name: Ashley Rubio  Medical record number: [id number]  Date of birth: 05/03/1936   This patient has been seen and the plan of care was discussed with the house staff. Please see their note for complete details. I concur with their findings with the following additions/corrections:  Agree with discharge plan as outliend by Dr. Orlin Hilding, MD 02/04/2013, 8:50 AM

## 2013-02-04 NOTE — Consult Note (Signed)
I have reviewed and discussed the care of this patient in detail with the nurse practitioner including pertinent patient records, physical exam findings and data. I agree with details of this encounter.  

## 2013-02-04 DEATH — deceased

## 2014-02-19 IMAGING — CR DG CHEST 2V
1 series · 1 of 1 positions shown · non-contrast
Comparison: None.

CLINICAL DATA: Dehydration.

CHEST - 2 VIEW

[w chest lat]
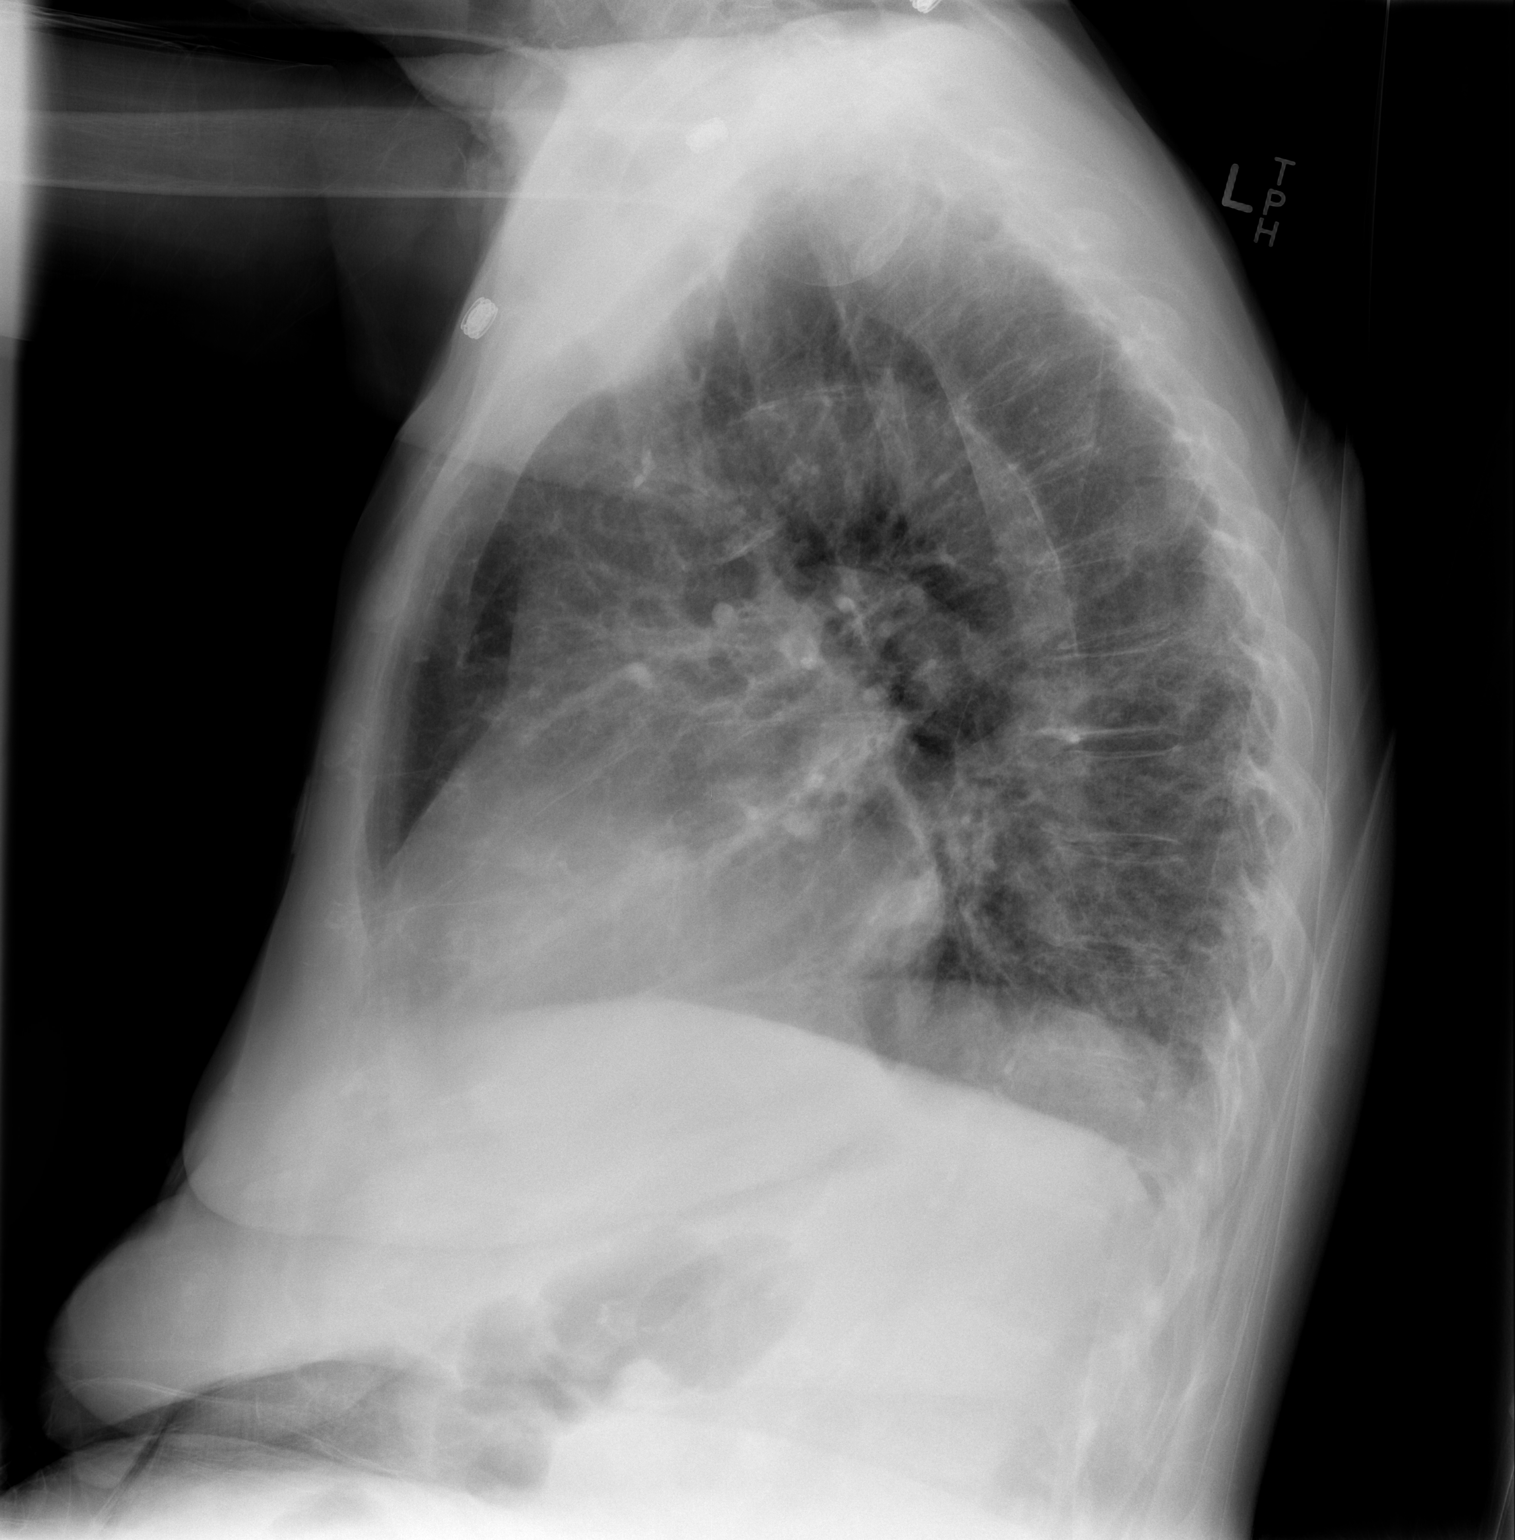

[1 of 1 positions shown; findings below may reference images not displayed]

FINDINGS: The heart is borderline enlarged.  The mediastinal and
hilar contours are within normal limits.  A probable chronic
bronchitic type interstitial lung changes but no definite acute
infiltrates or pleural effusion.  Low lung volumes with vascular
crowding and streaky atelectasis.  The bony thorax is intact.
IMPRESSION: Mild cardiac enlargement.
Low lung volumes with vascular crowding and atelectasis.
Probable underlying chronic bronchitic type lung changes.

## 2014-02-22 IMAGING — CT CT HEAD W/O CM
1 series · 16 of 30 positions shown, 20 images · non-contrast
Comparison: None

CLINICAL DATA: Altered mental status, delirium, insomnia, currently
being treated for UTI, history hypertension

CT HEAD WITHOUT CONTRAST
TECHNIQUE: Contiguous axial images were obtained from the base of
the skull through the vertex without contrast.

[Series 2: head 5.0 h30s · axial · 0.49mm/px · z∈[-170,-20]mm · 16 of 34 slices shown, 20 images]
[im 2/34  brain]
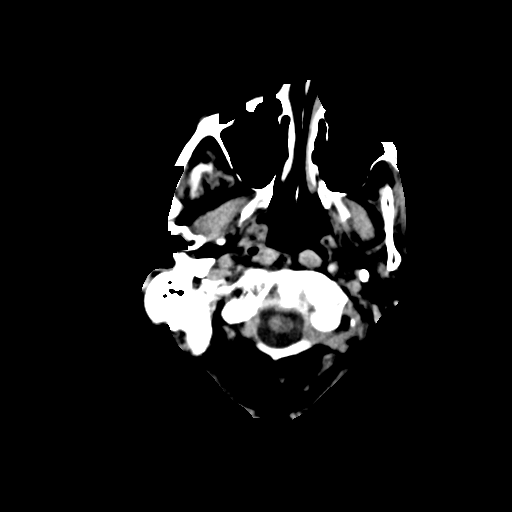
[im 2/34  bone]
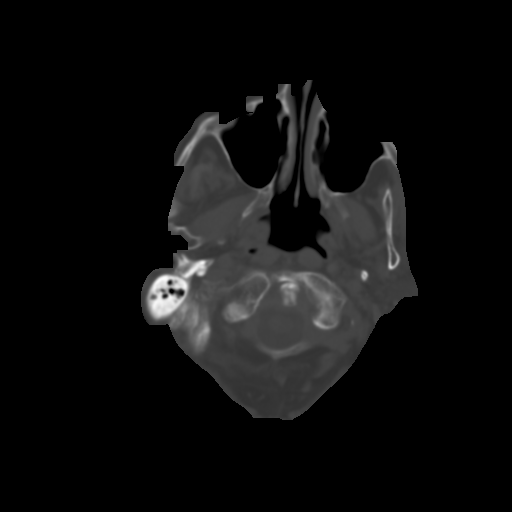
[im 4/34  brain]
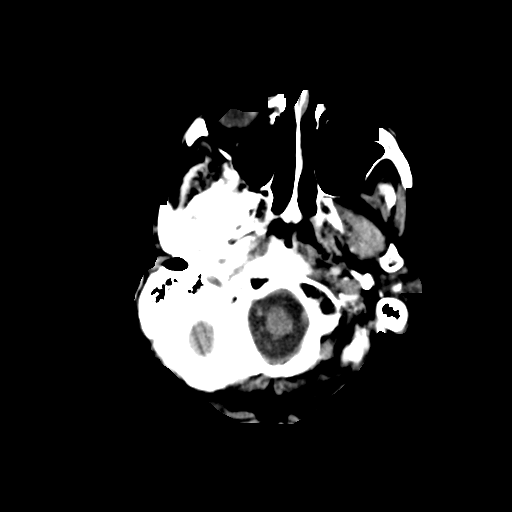
[im 6/34  brain]
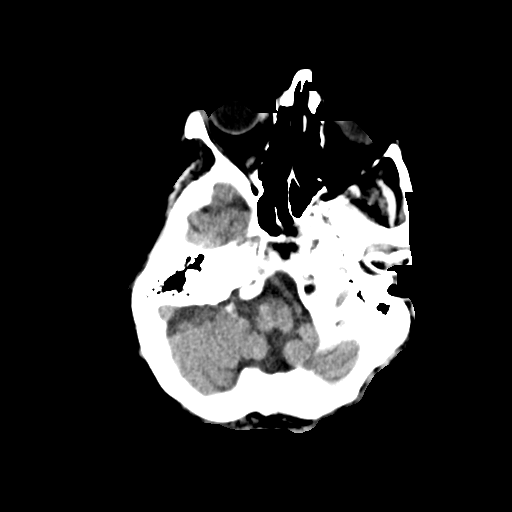
[im 8/34  brain]
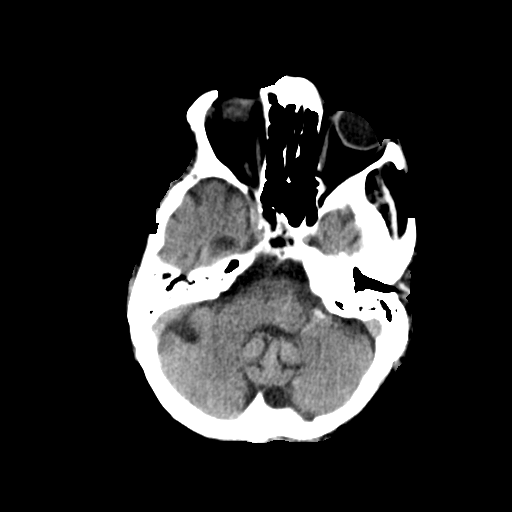
[im 10/34  brain]
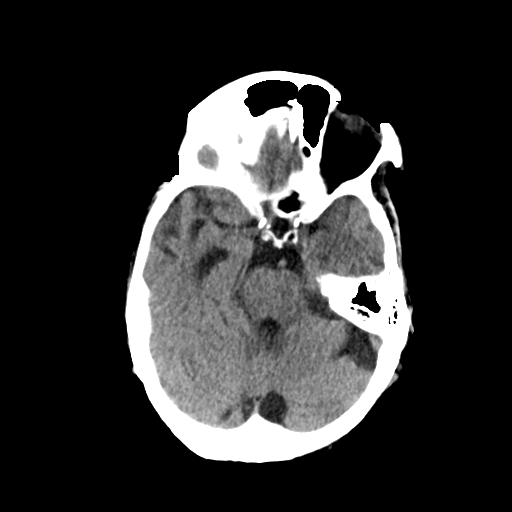
[im 10/34  bone]
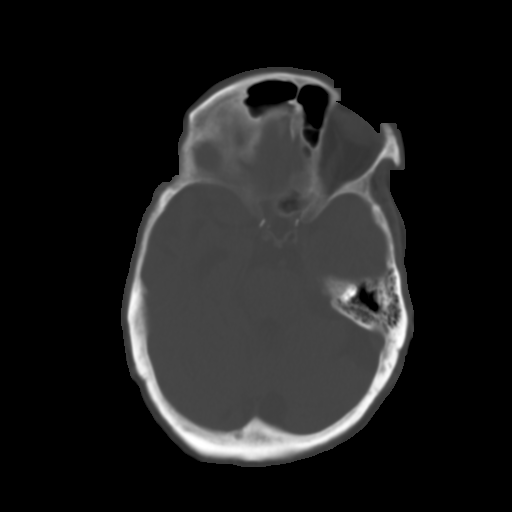
[im 12/34  brain]
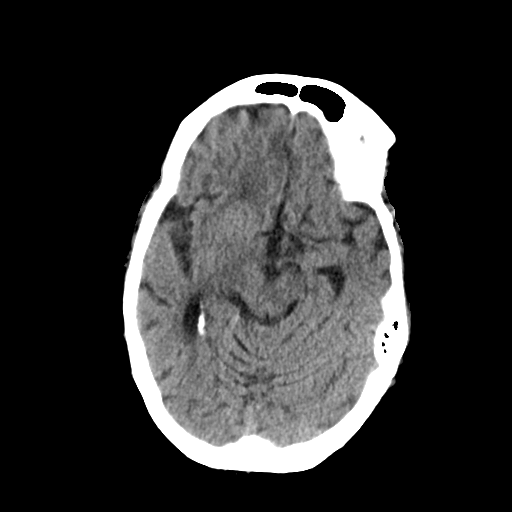
[im 14/34  brain]
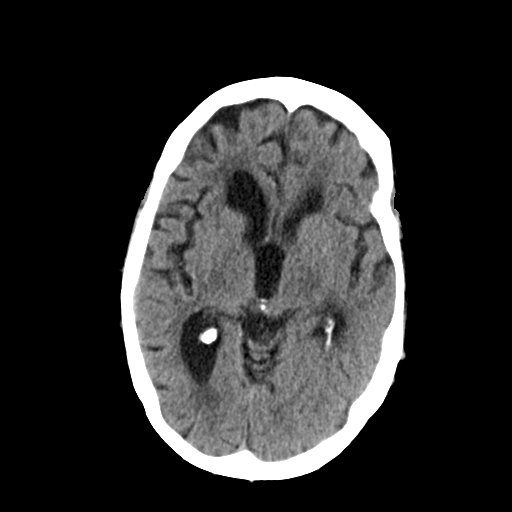
[im 16/34  brain]
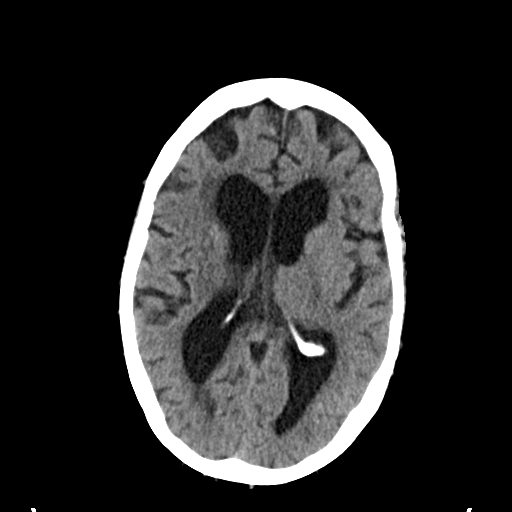
[im 18/34  brain]
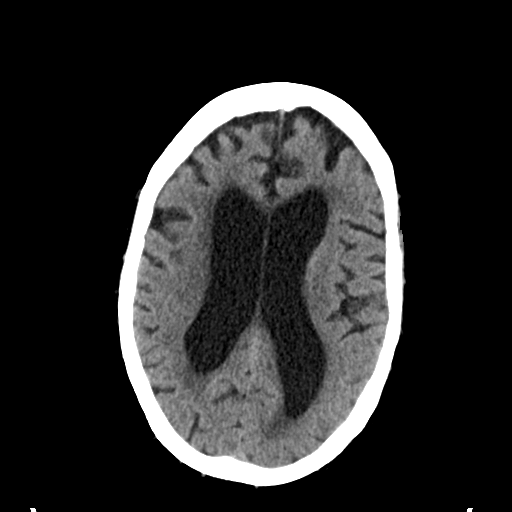
[im 18/34  bone]
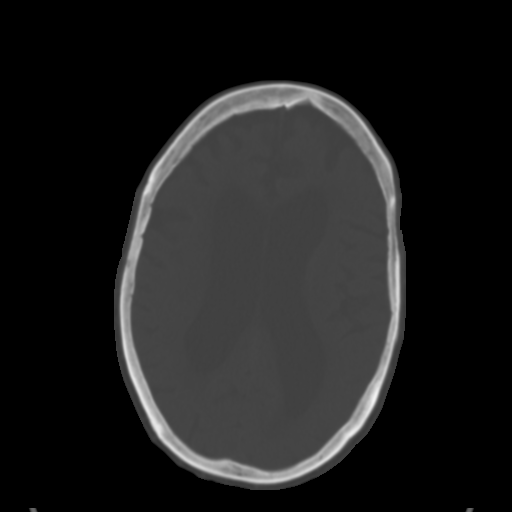
[im 20/34  brain]
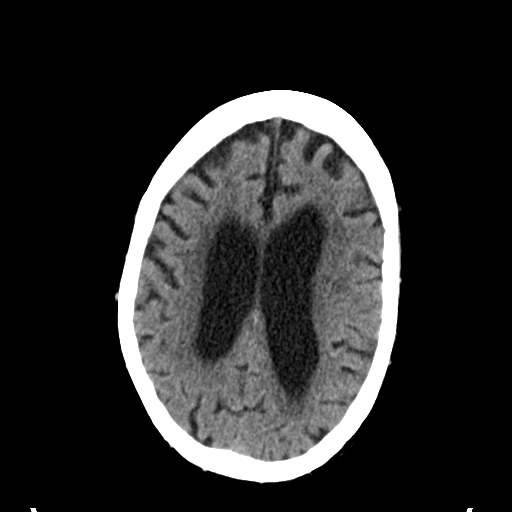
[im 22/34  brain]
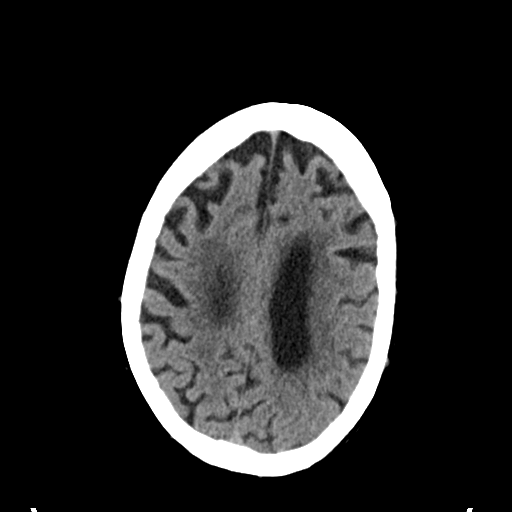
[im 24/34  brain]
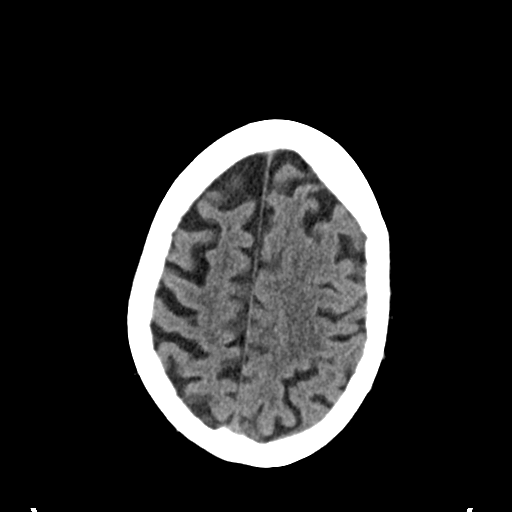
[im 26/34  brain]
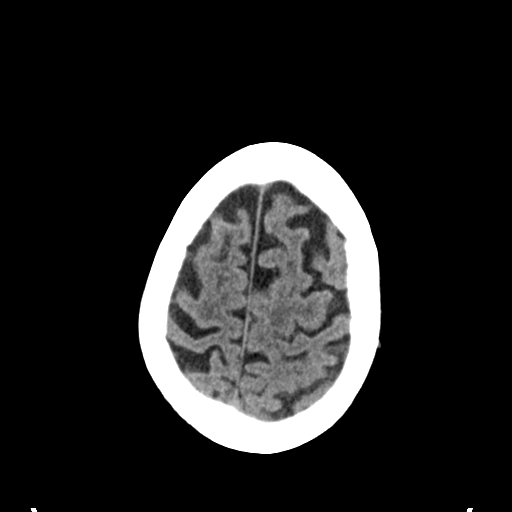
[im 26/34  bone]
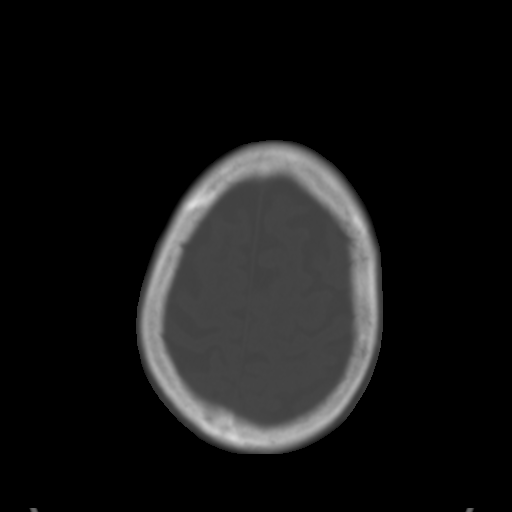
[im 28/34  brain]
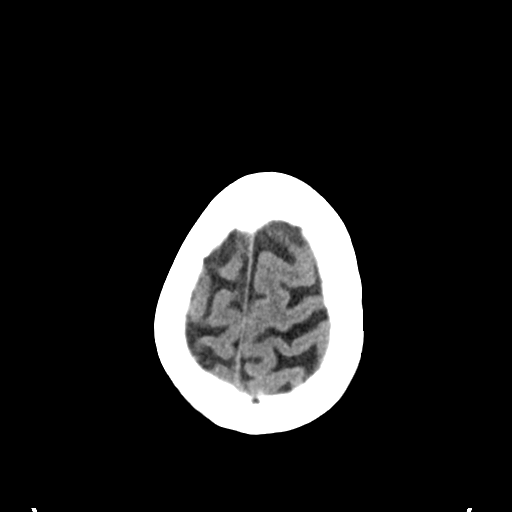
[im 30/34  brain]
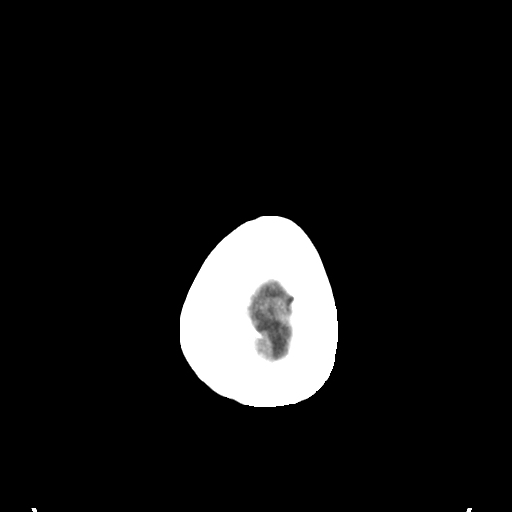
[im 32/34  brain]
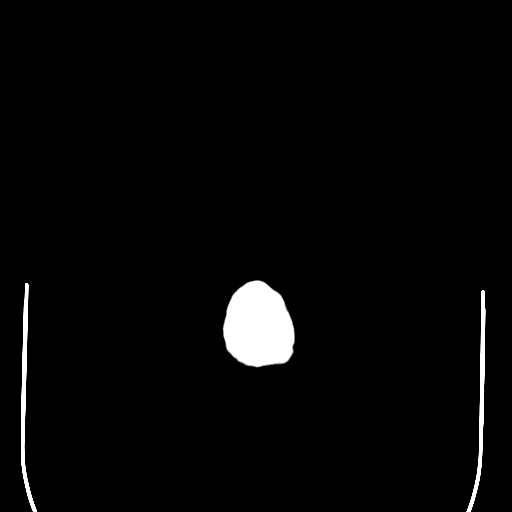

[16 of 30 positions shown; findings below may reference images not displayed]

FINDINGS: Generalized atrophy.
Normal ventricular morphology.
No midline shift or mass effect.
Small vessel chronic ischemic changes of deep cerebral white
matter.
No intracranial hemorrhage, mass lesion, or acute infarction.
Visualized paranasal sinuses and mastoid air cells clear.
Bones unremarkable.
Atherosclerotic calcification at carotid siphons.
IMPRESSION: Atrophy with small vessel chronic ischemic changes of deep cerebral
white matter.
No acute intracranial abnormalities.

## 2014-09-26 NOTE — H&P (Signed)
PATIENT NAME:  Ashley Rubio, Ashley Rubio MR#:  [id number] DATE OF BIRTH:  [birth year]  DATE OF ADMISSION:  12/13/2012  REFERRING PHYSICIAN: Valli Glance. Owens Shark, MD  PRIMARY CARE PHYSICIAN: Venetia Maxon. Elijio Miles, MD  CHIEF COMPLAINT: Altered mental status.   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 The patient was treated medically, and per the patient, there was plan to undergo surgery. However, the patient was placed on aspirin and anticoagulation with Lovenox and Coumadin. The patient's symptoms resolved. However, the patient continued to feel generalized weakness. During that admission, the patient was found to be confused, felt the patient does not have decision-making capability. The patient was discharged to the patient's son's house. Since the discharge, the patient continued to be somewhat confused. For the last 2 days, the confusion was significantly worse. Concerning this, the patient is brought to the Emergency Department. In the Emergency Department, the patient was found to be in atrial fibrillation with rapid ventricular rate and was also found to have a urinary tract infection. The patient was given a Cardizem bolus, with improvement of the heart rate to 80s. The patient received 1 dose of Levaquin in the Emergency Department. Denies having any cough, shortness of breath. Has been experiencing some abdominal pain on the right side of the abdomen. Has been having loose stools for the last 5 days.   PAST MEDICAL HISTORY:  1. Peripheral vascular disease.  2. Chronic atrial fibrillation.  3. Hypertension.  4. Hyperlipidemia.  5. COPD.  6. Dementia.   7. 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  8. History of colon CA, status post right hemicolectomy.  9. History of lower extremity DVT.  10. Anxiety, depression.  11. Urinary incontinence.   PAST SURGICAL HISTORY:  1. Hysterectomy.  2. Stomach surgery for peptic ulcer disease.  3. Appendectomy.   ALLERGIES:  1. LUNESTA. 2. PENICILLIN.  3. CODEINE.   HOME MEDICATIONS:  1. Coumadin 4 mg once a day.  2. Symbicort 2 puffs 2 times a day.  3. Metoprolol 50 mg 1 tablet every 12 hours.  4. Lisinopril 10 mg once a day.  5. Aricept 5 mg oral once a day.   SOCIAL HISTORY: Former heavy smoker, quit smoking 8 years back. Denies drinking alcohol or using illicit drugs. Currently lives with her daughter-in-law.   FAMILY HISTORY: No history of colorectal cancer, breast cancer, ovarian cancer.   REVIEW OF SYSTEMS:  CONSTITUTIONAL: Generalized weakness.  EYES: No change in vision.  EARS: No change in hearing.  RESPIRATORY: No cough, shortness of breath.  CARDIOVASCULAR: No chest pain, palpitations. No pedal edema.  GASTROINTESTINAL: Poor appetite, having diarrhea.  GENITOURINARY: No dysuria or hematuria.  SKIN: No rash or lesions.  HEMATOLOGIC: No easy bruising or bleeding.  MUSCULOSKELETAL: Has multiple joint aches and pains. NEUROLOGIC: No weakness or numbness in any part of the body.  PSYCHIATRIC: Anxiety, depression.   PHYSICAL EXAMINATION:  GENERAL: This is a well-built, well-nourished, age-appropriate female lying down in the bed, not in distress.  VITAL SIGNS: Temperature 98.2, pulse 94, blood pressure 110/72, respiratory rate of 22, oxygen saturation is 99% on room air.  HEENT: Head normocephalic, atraumatic. Eyes: No sclerae icterus. Conjunctivae normal. Pupils equal  and reactive to light. Mucous membranes: Mild dryness. No pharyngeal erythema.  NECK: Supple. No lymphadenopathy. No JVD. No carotid bruit.  CHEST: Has no focal tenderness.  LUNGS:  Bilaterally clear to auscultation.  HEART: S1, S2 regular. No murmurs are heard. No pedal edema. Pulses 2+.  ABDOMEN: Bowel sounds present. Soft, nontender, nondistended. No hepatosplenomegaly.  SKIN: No rashes or lesions.  MUSCULOSKELETAL: Good range of motion in all the extremities.  NEUROLOGIC: The patient is alert, oriented to place, person, not to time. Cranial nerves II through XII intact. Motor 5/5 in upper and lower extremities.   LABORATORY DATA: Troponins are negative. CT head without contrast: No acute intracranial abnormality. UA: 2+ leukocyte esterase, nitrite positive, 177 WBCs, 3+ bacteria.   ASSESSMENT AND PLAN: Ashley Rubio is a 79 year old female who comes to the Emergency Department with altered mental status, not feeling well.   1. Altered mental status. This is secondary to dehydration and urinary tract infection. Will treat the underlying infection.  2. Urinary tract infection. Will obtain urine cultures. Continue with Rocephin.  3. Peripheral vascular disease, with the patient has bilateral lower extremity swelling; however, does not seem to have any changes of ischemia. Will continue with the Lovenox and Coumadin for now.  4. Atrial fibrillation with rapid ventricular rate. Currently, rate is well controlled. Continue with home medications of metoprolol.  5. Diarrhea. The patient states soft stools, no loose stools. The patient's WBC is well within normal limits. If the patient has more liquidy stools, will obtain C. difficile toxin.  6. The patient is already on therapeutic dose of Lovenox.   TIME SPENT: 45 minutes.   ____________________________ Monica Becton, MD pv:OSi D: 12/14/2012 03:55:38 ET T: 12/14/2012 05:51:43 ET JOB#: [id number]  cc: Monica Becton, MD, <Dictator> Sheikh A. Elijio Miles, MD Monica Becton MD ELECTRONICALLY SIGNED 12/19/2012 0:27

## 2014-09-26 NOTE — Consult Note (Signed)
Brief Consult Note: Diagnosis: Dementia due to multiple medical causes probably a significant degree of vascular dementia with additional Alzheimer's. Delirium from medical problems on top of baseline dementia.   Patient was seen by consultant.   Consult note dictated.   Recommend further assessment or treatment.   Comments: Psychiatry: Patient seen and chart reviewed. This patient came into the hospital because of new onset unsteadiness and falls. She has a history of hypertension, atrial fibrillation, a history of cancer. Concerns expressed by the family and the treatment team that she is not capable of taking care of herself at home and not capable of making appropriate decisions about her own care. Patient was interviewed in the company of her daughter-in-law who appears to be the most actively involved in her care. Patient clearly has signs of dementia. She was not delirious during my interview but from what I see in the chart it appears that she's had episodes of delirium at times. Patient understands that there is risk involved with living independently. I think she is able to understand that there would be less risk if she lived with her son and daughter-in-law. I'm not sure that she can really make the logical connection in part because of her dementia and in part because of her chronic anxiety. I very much hope that she can be convinced to go to her son and daughter-in-law's home willingly. If it came to it, I don't think she is really capable of making a decision to stay independently. No change to medical treatment. Will follow.  Electronic Signatures: Gonzella Lex (MD)  (Signed 02-Jul-14 17:51)  Authored: Brief Consult Note   Last Updated: 02-Jul-14 17:51 by Gonzella Lex (MD)

## 2014-09-26 NOTE — Discharge Summary (Signed)
PATIENT NAME:  Ashley Rubio, Ashley Rubio MR#:  [id number] DATE OF BIRTH:  14-Mar-1936  DATE OF ADMISSION:  12/03/2012 DATE OF DISCHARGE:  12/06/2012  DISCHARGE DIAGNOSES: 1.  Peripheral arterial disease, chronic venous insufficiency.  2.  Dementia.  3.  Delirium. 4.  Chronic atrial fibrillation.  5.  Type 2 diabetes mellitus.   PROCEDURES PERFORMED: CT angiogram of her legs.   CONSULTATIONS: 1.  Vascular surgery, Dr. Delana Meyer.   2.  Psychiatry, Dr. Weber Cooks.  DISCHARGE MEDICATIONS: 1.  Aricept 5 mg daily. 2   Enoxaparin 70 mg b.i.d.  3.  Metoprolol 50 mg b.i.d.  4.  Warfarin 4 mg once a day.  5.  Symbicort 80/4.5 two puffs b.i.d.  6.  Lisinopril 10 mg daily.   HOSPITAL COURSE: This lady was admitted through the Emergency Room complaining of dusky discoloration of her toes and exacerbation of chronic pain. Please refer to the history and physical for details. She underwent a CTA of her legs which revealed her peroneal artery was attenuated distally on the right and on the left. Dr. Delana Meyer initially considered angioplasty to address this but subsequently decided to withhold any surgical interventions and follow the patient as an outpatient and proceed from there. The patient exhibited severe delirium while she was in the hospital. I consulted Psychiatry, Dr. Weber Cooks, whose recommendations included continuing her haloperidol initiated by me and to start treatment for dementia as an outpatient. The patient's leg was elevated. She wore TED stockings and the color in her feet gradually improved. The patient was ambulatory at the time of discharge. The patient is discharged to her son's home in satisfactory condition as she was deemed too delirious to return home without any supervision.   DISCHARGE INSTRUCTIONS:   1.  Diet: Low sodium, ADA diet.  2.  Activity: As tolerated.  3.  Follow-up with Dr. Elijio Miles in 1 to 2 weeks and with Dr. Delana Meyer in 1 to 2 weeks.   DISCHARGE PROCESS TIME SPENT:  33  minutes.   ____________________________ Venetia Maxon Elijio Miles, MD sat:dp D: 12/19/2012 13:57:36 ET T: 12/19/2012 16:12:10 ET JOB#: [id number]  cc: Sheikh A. Elijio Miles, MD, <Dictator> Veverly Fells MD ELECTRONICALLY SIGNED 12/25/2012 13:32

## 2014-09-26 NOTE — H&P (Signed)
PATIENT NAME:  Ashley Rubio, Ashley Rubio MR#:  [id number] DATE OF BIRTH:  1935/10/19  DATE OF ADMISSION:  12/03/2012  PRIMARY CARE PHYSICIAN:  Dr. Elijio Miles   HISTORY OF PRESENT ILLNESS:  The patient is a 79 year old Caucasian female with past medical history significant for history of multiple medical problems, including history of AFib, COPD, prolonged tobacco abuse for at least 50 years, history of lower extremity edema, hypertension as well as hyperlipidemia, who presents to the hospital with complaints of lower extremity pain. According to the patient, she was doing well up until approximately a few days ago, when she started having problems with lower extremity swelling as well as feeling that her lower extremities are turning purple, dark color. She has more pain in her lower extremities. She has been having those problems for at least 6 months now, which are getting progressively worse. However, over the past 2 days, the pain as well as dark bluish discoloration became worse. She also started noticing some drainage from the wounds in the right lower extremity. She also admits to having some significant cramping in the lower extremities, especially whenever she tries to walk. She cannot even walk now due to significant pain she has been having. In the Emergency Room, she was noted to have poor palpable pulses in periphery and her feet were cold. Hospitalist services were contacted for admission. Apparently the patient's family was also concerned that the patient was a little bit more confused.  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   PAST SURGICAL HISTORY:  Hysterectomy, also stomach surgery for ulcer disease, as well as appendectomy.   FAMILY HISTORY:  No history of colorectal cancer, breast cancer, ovarian cancer. History of  (Dictation Anomaly) blood clots  in the family.   SOCIAL HISTORY: The patient was a chronic smoker, quit approximately 8 years ago. Smoked for 50 years. No alcohol. No recreational drugs.   MEDICATIONS:  Per medical records, the patient is on lisinopril 10 mg p.o. daily, Symbicort 80/4.5 at 2 puffs twice daily, warfarin 4 mg p.o. once daily. In the past, she was also on aspirin; it does not seem that she is taking any aspirin therapy any more. She was also on cholesterol medication, simvastatin, but that was also taken off.  REVIEW OF SYSTEMS:  GENERAL:  Positive for fatigue and weakness, pains in the lower extremities, some blurring of vision to distance. She does use reading glasses. She admits to having some right lower extremity weakness and pains, more than the left lower extremity weakness and pain. Some intermittent heart palpitations. Also dyspnea on exertion. Nausea, vomiting yesterday. Some tingling sensation in her lower extremities and her feet, as well as pain and claudication whenever she walks around. Denies any  fevers, weight loss or gain.  EYES: Denies any double vision, glaucoma, cataracts.  EARS, NOSE, THROAT: Denies any tinnitus, allergies, epistaxis, sinus pain, dentures or difficulty swallowing.   RESPIRATORY:  Denies any cough, wheezes, asthma, COPD.   CARDIOVASCULAR:  Denies any chest pains, arrhythmias, palpations.  GASTROINTESTINAL: Denies any diarrhea or constipation.  GENITOURINARY: Denies any dysuria, hematuria, frequency, incontinence.  ENDOCRINE:  Denies polydipsia, nocturia, thyroid problems, heat or  cold intolerance.  HEMATOLOGIC: Denies anemia, easy bruising, bleeding. swollen glands.  SKIN:  Denies any acne,  rashes, change in moles.  MUSCULOSKELETAL: Denies arthritis, cramps, swelling, gout.  NEUROLOGIC: No numbness, epilepsy or tremor.  PSYCHIATRIC:  Denies anxiety, insomnia or depression.   PHYSICAL EXAMINATION: VITAL SIGNS: On arrival to the hospital, temperature is 96.4, pulse 112, respiratory rate was 18, blood pressure 128/83, saturation was 99% on room air.  GENERAL: This is a well-nourished Caucasian female in mild to moderate distress secondary to her foot pains. HEENT: Pupils are equal, reactive to light. Extraocular movements intact. No icterus or conjunctivitis. Has normal hearing. No pharyngeal erythema. Mucosa is moist.   NECK: No masses. Supple, nontender. Thyroid is not enlarged. No adenopathy. No JVD or carotid bruits bilaterally. Full range of motion.  LUNGS: Clear to auscultation in all fields. There are diminished breath sounds bilaterally. No significant wheezing; however, the patient does have some labored respirations, especially whenever she moves around in the bed, not walks around, but just moves around in the bed. She does have increased effort to breathe, but not in overt respiratory distress.  CARDIOVASCULAR: S1, S2 appreciated. No murmurs, rubs, gallops. Rhythm was irregularly irregular. PMI not lateralized. Chest is nontender to palpation. The patient's pulses are significantly diminished, and the patient's feet are cold to touch. Pulses are palpable; however, diminished. The patient does have some bluish discoloration of her feet. She has some reddened and thickened skin in her feet.   ABDOMEN: Soft, nontender. Bowel sounds are present. No splenomegaly or masses were noted.  RECTAL:  Deferred.  MUSCLE STRENGTH:  Able to move all extremities. Degenerative joint disease or kyphosis. Gait is not tested.  EXTREMITIES: The patient did have some cyanotic areas in her lower extremities, in her feet; otherwise unremarkable.  SKIN:  No rashes, lesions, erythema, nodularity,  induration. It was warm and dry to palpation.  LYMPHATIC: No adenopathy in the cervical region. NEUROLOGIC: Cranial nerves grossly intact. Sensory is diminished in the feet. No dysarthria or aphasia.  PSYCHIATRIC: The patient is alert and oriented to time, person and place. Cooperative. Memory is somewhat impaired, but no significant confusion, agitation or depression noted.   EKG showed AFib at a rate of 118 beats per minute, normal axis, premature aberrantly conducted complexes, low voltage QRS, nonspecific ST-T wave abnormality according to EKG criteria.   LABORATORY DATA:  Revealed BMP was unremarkable; however, the patient's sodium level was low at 134, magnesium 1.3, ammonia level less than 25, alcohol level less than 3. Liver enzymes were normal; however, the patient's total bilirubin was elevated at 1.1. TSH was normal at 3.39. Urine drug screen was negative. White blood cell count is low at 3.4, hemoglobin was 12.3, and platelet count 194. Coagulation panel:  Pro time 33.7, INR was 3.5. Urinalysis: Yellow,  clear urine; negative for glucose, bilirubin; trace ketones, specific gravity 1.008, pH was 5.0; negative for blood, protein, nitrites or leukocyte esterase. Less than 1 red blood cell, less than 1 white blood cell, trace bacteria, less than 1 epithelial cell, 1 hyaline cast.  RADIOLOGIC STUDIES:  Chest PA and lateral, 12/03/2012, showed nonspecific interstitial opacities, could represent interstitial edema. Interstitial lung disease would be differential consideration according to radiologist. CT scan of head without contrast, 12/03/2012, showed  no evidence of acute intracranial hemorrhage or evolving ischemic infarction; stable, diffuse cerebral and cerebellar atrophy with compensatory ventriculomegaly. Stable areas of decreased density in deep white matter of both cerebral hemispheres consistent with chronic small-vessel  ischemia, according to radiologist.   ASSESSMENT AND PLAN: 1. Acute  on chronic foot ischemia due to underlying chronic tobacco-related peripheral vascular disease. Admit the patient to medical floor. Start her on aspirin therapy. Get vascular consultation. Get CTA of aorta with iliofemoral runoff. Will start Lipitor as well, and get lipid panel in the morning. 2.  Atrial fibrillation with rapid ventricular response. Will start patient on Cardizem. Advance Cardizem as needed. Unable to use beta blockers due to severe peripheral vascular disease. Coumadin therapy (Dictation Anomaly) will be held due to protime being supratherapeutic.  3.  Hypertension. Will continue home medication.  4.  Hyperlipidemia. Will start Lipitor, get lipid panel in the morning.  5.  Hyponatremia. Will continue the patient on IV fluids at low rate. No diuretics at this time.  6.  History of chronic obstructive pulmonary disease. Will continue the patient on Symbicort. Will also add Xopenex inhaler every 4 hours while awake.   TIME SPENT:  50 minutes.    ____________________________ Theodoro Grist, MD rv:mr D: 12/03/2012 18:43:00 ET T: 12/03/2012 20:23:24 ET JOB#: [id number]  cc: Sheikh A. Elijio Miles, MD Theodoro Grist, MD, <Dictator>    [place] MD ELECTRONICALLY SIGNED 12/19/2012 6:51

## 2014-09-26 NOTE — Discharge Summary (Signed)
PATIENT NAME:  Ashley Rubio, Ashley Rubio MR#:  [id number] DATE OF BIRTH:  [birth year]  DATE OF ADMISSION:  12/13/2012 DATE OF DISCHARGE:  12/19/2012  DISCHARGE DIAGNOSES: 1.  Urinary tract infection.  2.  Delirium.  3.  Chronic atrial fibrillation.  4.  Peripheral arterial disease.  5.  Dementia.  PROCEDURES:  Peripheral double lumen catheter placement.   CONSULTATIONS:  Cardiology, Dr. Neoma Laming.   DISCHARGE MEDICATIONS:  Please refer to discharge medical reconciliation.  MORE CURRENT MEDICATIONS: Include: 1.  Ertapenem 1 gram intravenously daily, stop date 12/26/2012. 2.  Haloperidol 2 mg p.r.n. 3 times a day for anxiety or agitation. 3.  Aricept 5 mg daily. 4.  Lisinopril 10 mg. 5.  Symbicort 80/4.5 two puffs b.i.d. 6.  Metoprolol 50 mg every 12 hours. 7.  Digoxin 50 mcg 1 tab daily. 8.  Coumadin 3 mg daily.  DISCHARGE INSTRUCTIONS:  DIET: Low sodium.  ACTIVITY LIMITATIONS:  None.  FOLLOWUP: With Dr. Elijio Miles in 1 to 2 weeks. Following discharge from skilled nursing facility, the patient is to see Dr. Neoma Laming, cardiology, in 1 to 2 weeks for atrial fibrillation followup.   HOSPITAL COURSE:  This lady was admitted through the Emergency Room after presenting with worsening delirium who was found to have urinary tract infection in the ED and atrial fibrillation and ventricular response. Please refer to history and physical for full details. She was admitted to the severe critical care unit where she received intravenous Cardizem. She was started on ceftriaxone for her urinary tract infection. Her urine cultures grew E. coli which is sensitive to ertapenem but resistant to quinolones. Due to allergy and renal failure, ertapenem was the suitable antibiotic of choice. The patient'[name] heart rate was controlled with metoprolol and digoxin.  Her Cardizem was discontinued by cardiology. Several hours after admission, she was placed on Amiodarone in an attempt to convert to NSR. The  patient'[name] hospital stay was uncomplicated, her delirium improved to her baseline and she is being discharged to a skilled nursing in satisfactory condition.  The patient advised to keep her appointment and to call with any questions or concerns.  DISCHARGE PROCESS TIME SPENT:  33 minutes   ____________________________ Venetia Maxon. Elijio Miles, MD sat:ce D: 12/19/2012 13:10:44 ET T: 12/19/2012 13:29:13 ET JOB#: [id number]  cc: Sheikh A. Elijio Miles, MD, <Dictator> Veverly Fells MD ELECTRONICALLY SIGNED 12/25/2012 13:33

## 2014-09-26 NOTE — Consult Note (Signed)
PATIENT NAME:  Ashley Rubio, Ashley Rubio MR#:  [id number] DATE OF BIRTH:  Jul 26, 1935  DATE OF CONSULTATION:  12/14/2012  REFERRING PHYSICIAN:   CONSULTING PHYSICIAN:  Dionisio David, MD  HISTORY OF PRESENT ILLNESS: This is a 79 year old white female with a past medical  history of peripheral vascular disease, history of A-fib, hypertension, hyperlipidemia, COPD and  dementia who came into the hospital with altered mental status.  She was found to be in atrial fibrillation with rapid ventricular response rate and was started on Cardizem. I was asked to evaluate the patient, and the patient appears to be quite still confused.   PAST MEDICAL HISTORY: As mentioned above.  Also history of DVT and dementia.   ALLERGIES: LUNESTA, PENICILLIN AND CODEINE.   MEDICATIONS: Coumadin, metoprolol, lisinopril, Aricept.   SOCIAL HISTORY: Former smoker. Denies EtOH abuse.   FAMILY HISTORY: Unremarkable.   PHYSICAL EXAMINATION: GENERAL: She is alert and oriented x 3. VITAL SIGNS: Heart rate is 102, blood pressure 122/73 and respirations 25.  She is afebrile at 98.2.  NECK: Positive JVD. LUNGS: Good air entry.  HEART: Irregularly irregular plus normal S1 and S2. No audible murmur.  ABDOMEN: Soft and nontender. Positive bowel sounds.  EXTREMITIES: No pedal edema.  NEUROLOGIC: She appears to be intact.   DIAGNOSTICS:  EKG shows A-fib with rate 112, left axis deviation, anteroseptal wall MI, old, and nonspecific ST-T changes.   ASSESSMENT AND PLAN: The patient has atrial fibrillation. It is probably chronic. She has been on Coumadin. We will try amiodarone drip. If she fails to convert, will change to Cardizem and metoprolol and transfer the patient to the floor. Thank you very much for the referral.  ____________________________ Dionisio David, MD sak:sb D: 12/14/2012 13:31:11 ET T: 12/14/2012 14:19:46 ET JOB#: [id number]  cc: Dionisio David, MD, <Dictator> Dionisio David MD ELECTRONICALLY SIGNED 01/14/2013  8:12

## 2014-09-26 NOTE — Consult Note (Signed)
Brief Consult Note: Comments: Tried to see pt but she was already discharged.  Electronic Signatures: Jeronimo Norma (MD)  (Signed 03-Jul-14 13:50)  Authored: Brief Consult Note   Last Updated: 03-Jul-14 13:50 by Jeronimo Norma (MD)

## 2014-09-26 NOTE — Consult Note (Signed)
PATIENT NAME:  Ashley Rubio, Ashley Rubio MR#:  [id number] DATE OF BIRTH:  [birth year]  DATE OF CONSULTATION:  12/05/2012  CONSULTING PHYSICIAN:  Gonzella Lex, MD  IDENTIFYING INFORMATION AND REASON FOR CONSULT: A 79 year old woman currently in the hospital because of new onset falls. Consult because of dementia and delirium and questions of capability to make decisions.   HISTORY OF PRESENT ILLNESS: Information obtained from the patient and the chart. The patient was interviewed in the company of her daughter-in-law. As I understand it, the treatment team and the family have both expressed concern that there have been episodes of the patient not being able to take care of herself safely. Additionally, she has recently had a worsening of unsteadiness and falls. She appears to be more confused at times. The family has offered to have the patient live with them at home, but the patient has been resistant to this. On interview today, the patient states that she is feeling better medically than she was when she came in. She can only describe to me that she had fallen and had "weak spells" at home but cannot give me any more description of it than that. She is really not able to provide any clearer medical history. Right now, she is denying having any pain, denying feeling unsteady. She says she was able to get up out of bed and walk around a little bit today. The patient says that she is not aware that she has been having any problems with her memory or concentration. She does recall that members of her family, especially her children, have at times told her that they thought there was something wrong with her and that she was not thinking clearly, but she cannot describe to me any more about what the circumstances were around that. She denies being depressed. Denies trouble with sleep or appetite. Denies hopelessness. Denies suicidal ideation, denies psychotic symptoms. She does state that she is a nervous person and  has been nervous all of her life. She indicates that she is generally anxious about things and also suggests that she may have some degree of obsessive-compulsive habits. She is evidently not receiving any specific mental health treatment. I see, from the chart, and that it is indicated that the patient has had some episodes of agitation during her hospitalization that have required treatment with intravenous haloperidol.   PAST PSYCHIATRIC HISTORY: The patient denies having ever seen a psychiatrist or mental health provider before. She denies ever being on any psychiatric medicine. Denies psychiatric hospitalizations. I do not see anything in her old chart to suggest that she has had any mental health treatment.   PAST MEDICAL HISTORY: The patient has a history of atrial fibrillation which in the past had been treated with anticoagulant therapy. Has a history of hypertension, history of dyslipidemia. She also has a history of cancer which has been treated successfully in the past. Long term history of smoking, some degree of chronic obstructive pulmonary disease.   SOCIAL HISTORY: The patient lives on her own. She has several children; as far as I can tell, only one son lives here in the area. He and his wife appear to be the most actively involved in the patient's care. They currently help her to get to the store and take care of her daily errands. The patient is not able to drive anymore and at least has the understanding of that. The daughter-in-law and son, unfortunately, do not live right next door; so they are not  immediately available all the time.   SUBSTANCE ABUSE HISTORY: No history or evidence of active substance abuse issues.   ALLERGIES: CODEINE LUNESTA, PENICILLIN.   CURRENT MEDICATIONS: Here in the hospital, she is being treated with diltiazem 120 mg a day, Zestril 10 mg a day, aspirin 81 mg a day, warfarin 4 mg a day, Symbicort inhaler 2 puffs twice a day, Atrovent inhaler, levalbuterol,  also getting p.r.n. doses of haloperidol for agitation.   MENTAL STATUS EXAMINATION: The patient was interviewed in her hospital bed. She was awake and alert when I came to see her. She was pleasant and cooperative. Made good eye contact. Psychomotor activity was generally appropriate. Speech was of normal rate and tone. Her affect was initially euthymic, became increasingly anxious during the interview when she could not answer questions correctly. Never seemed panicky. Did not become hostile. Did look confused several times. Mood was stated as being okay, but nervous. Thoughts are superficially, appropriate, although at times she appears to be confabulating and there is clear evidence that her memory is not all it should be. No obvious evidence of delusions, but she is not able to really articulate an understanding of her situation. Denies hallucinations. Denies suicidal or homicidal ideation. The patient's insight is impaired by her memory and cognitive problems, which make it impossible for her to hold onto a clear idea of her medical issues. For the same reason, judgment is questionable. The Mini-Mental status exam gave her a score of 13 out of 30. This indicates fairly significant dementia.   LABORATORY RESULTS: Head CT was done and did not show a specific or acute issue, but did show evidence of chronic atrophy. Head CTs are not the most clear tool for evaluating issues like micro angioedema or micro strokes, which I suspect she probably has. Drug screen negative. Bilirubin is up a little bit. Otherwise, fairly unremarkable, as far as things related to mental status exam. No alcohol on admission. TSH normal. Ammonia not significant.   ASSESSMENT: A 79 year old woman who, on interview today, is clearly showing signs of dementia with dementia present across multiple areas of cognition, Given her history, I think this probably suggests a significant part of this is vascular dementia, although there is  probably some Alzheimer's going on as well. During the daytime and during the time that I evaluated her, she is not delirious; but I see from the chart, there have been episodes of delirium that have happened at night, which is pretty typical for a person with this degree of dementia. I agree that it appears obvious to me that this patient would be much safer living in an environment in which there was supervision and that this is a state that is not going to change. She is going to have serious difficulty taking medication properly and taking care of her safely if she has to live independently. She is going to have major problems dealing with any kind of crisis in her environment. The question then is whether she is capable of making decisions. The family has generously offered to have her live with them which seems like a reasonable solution to the current problem. The patient right now is balking at it. Her excuses for this are largely anxiety based but also probably reflect her inability to fully understand the situation. I think she gets the basic idea that it is not safe really for her to live independently, but she is not able to think this all the way through to make  a reasonable decision. Therefore, I think that if I had to come down on it I would say she is not really capable of making a decision to live independently. I hope very much that she can be convinced to willingly go along to stay with her son. This would certainly be the more pleasant outcome and safest one for everybody. The current intravenous Haldol is totally appropriate as far as I can see for dealing with the delirium that arises. No need for any other new medication to be started. With vascular dementia, the anti-Alzheimer's treatments are probably not going to be as effective in slowing down progression, although it might be a good idea to start Aricept prior to discharge.   DIAGNOSIS, PRINCIPAL AND PRIMARY:   AXIS I: Dementia due to  multiple factors, probably largely vascular plus Alzheimer's.   SECONDARY DIAGNOSES:  AXIS I: Delirium intermittent due to her dementia medical problems.                Generalized anxiety disorder.   AXIS II: Deferred.   AXIS III: Multiple medical problems as detailed above.   AXIS IV: Severe from living independently.   AXIS V: Functioning at time of evaluation is 85.   ____________________________ Gonzella Lex, MD jtc:rw D: 12/05/2012 18:04:58 ET T: 12/05/2012 19:56:47 ET JOB#: [id number]  cc: Gonzella Lex, MD, <Dictator> Gonzella Lex MD ELECTRONICALLY SIGNED 12/06/2012 10:47

## 2014-09-26 NOTE — Consult Note (Signed)
Present Illness The patient is a 79 year old female with past medical history significant for history of multiple medical problems, including history of AFib, COPD, prolonged tobacco abuse for at least 50 years, history of lower extremity edema, hypertension as well as hyperlipidemia, who presents to the hospital with complaints of lower extremity pain. According to the patient, she was doing well up until approximately a few days ago, when she started having problems with lower extremity swelling as well as feeling that her lower extremities are turning purple, dark color. She has more pain in her lower extremities. She has been having those problems for at least 6 months now, which are getting progressively worse.  This morning and again this evening she is no longer complaining of pain.  She is quite confused "I am so tired I've been shopping all day".  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   PAST SURGICAL HISTORY:  Hysterectomy, also stomach surgery for ulcer disease, as well as appendectomy.   Home Medications: Medication Instructions Status  lisinopril 10 mg oral tablet 1 tab(s) orally once a day Active  Symbicort 80 mcg-4.5 mcg/inh inhalation aerosol 2 puff(s) inhaled 2 times a day Active  warfarin 4 mg oral tablet 1 tab(s) orally once a day Active    Lunesta: Alt Ment Status  Penicillin: Swelling, Rash  Codeine: Other  Case History:  Family History Non-Contributory   Social History negative tobacco, negative ETOH, negative Illicit drugs   Review of Systems:   Fever/Chills No   Cough No   Sputum No   Abdominal Pain No   Diarrhea No   Constipation No   Nausea/Vomiting No   SOB/DOE No   Chest Pain No   Telemetry Reviewed Afib   Dysuria No   Physical Exam:  GEN well developed, well nourished, no acute distress   HEENT hearing intact to voice, moist oral mucosa   NECK supple  trachea midline   RESP normal resp effort  no use of accessory muscles   CARD irregular rate  no murmur  no JVD   ABD denies tenderness  soft  nondistended   EXTR positive edema, bilateral lower extremiteis are cyanotic with dependency but much improved when elevated of the bed.  Multiple varicose veins.  No open wound or draining sites pedal pulses nonpalpable.   SKIN positive rashes, No ulcers, skin turgor poor   NEURO follows commands, motor/sensory function intact   PSYCH alert, poor insight, not oriented to time or place   Nursing/Ancillary Notes: **Vital Signs.:   01-Jul-14 04:28  Vital Signs Type Routine  Temperature Temperature (F) 97.6  Celsius 36.4  Temperature Source oral  Pulse Pulse 121  Respirations Respirations 22  Systolic BP Systolic BP 462  Diastolic BP (mmHg) Diastolic BP (mmHg) 74  Mean BP 86  Pulse Ox % Pulse Ox % 93  Pulse Ox Activity Level  At rest  Oxygen Delivery Room Air/ 21 %   Thyroid:  30-Jun-14 10:36   Thyroid Stimulating Hormone 3.39 (0.45-4.50 (International Unit)  ----------------------- Pregnant patients have  different reference  ranges for TSH:  - - - - - - - - - -  Pregnant,  first trimetser:  0.36 - 2.50 uIU/mL)  Hepatic:  30-Jun-14 10:36   Bilirubin, Total  1.1  Alkaline Phosphatase 95  SGPT (ALT) 18  SGOT (AST) 25  Total Protein, Serum 6.7  Albumin, Serum 3.4  Bilirubin, Direct  0.3 (Result(s) reported on 03 Dec 2012 at 06:44PM.)  01-Jul-14 04:04   Bilirubin, Total 1.0  Alkaline Phosphatase 99  SGPT (ALT) 18  SGOT (AST) 21  Total Protein, Serum  6.0  Albumin, Serum  3.0  Routine  BB:  01-Jul-14 13:07   ABO Group + Rh Type O Positive  Antibody Screen NEGATIVE (Result(s) reported on 04 Dec 2012 at 02:55PM.)  Cardiology:  30-Jun-14 12:47   Ventricular Rate 118  Atrial Rate 48  QRS Duration 94  QT 320  QTc 448  R Axis 71  T Axis 169  ECG interpretation Atrial fibrillation with rapid ventricular response with premature ventricular or aberrantly conducted complexes Low voltage QRS Nonspecific ST and T wave abnormality , probably digitalis effect Abnormal ECG When compared with ECG of 29-Nov-2011 08:17, Atrial fibrillation has replaced Junctional rhythm Vent. rate has increased BY  49 BPM Criteria for Septal infarct are no longer Present Inverted T waves have replaced nonspecific T wave abnormality in Lateral leads ----------unconfirmed---------- Confirmed by OVERREAD, NOT ([id number]), editor PEARSON, Vora ([id number]) on 12/04/2012 8:37:03 AM  Routine Chem:  30-Jun-14 10:36   Glucose, Serum 97  BUN 9  Creatinine (comp) 0.88  Sodium, Serum  134  Potassium, Serum 4.5  Chloride, Serum 102  CO2, Serum 23  Calcium (Total), Serum 8.7  Osmolality (calc) 267  eGFR (African American) >60  eGFR (Non-African American) >60 (eGFR values <36m/min/1.73 m2 may be an indication of chronic kidney disease (CKD). Calculated eGFR is useful in patients with stable renal function. The eGFR calculation will not be reliable in acutely ill patients when serum creatinine is changing rapidly. It is not useful in  patients on dialysis. The eGFR calculation may not be applicable to patients at the low and high extremes of body sizes, pregnant women, and vegetarians.)  Anion Gap 9  Ethanol, S. < 3  Ethanol % (comp) < 0.003 (Result(s) reported on 03 Dec 2012 at 12:27PM.)  Magnesium, Serum  1.3 (1.8-2.4 THERAPEUTIC RANGE: 4-7 mg/dL TOXIC: > 10 mg/dL  -----------------------)    16:03   Ammonia, Plasma < 25 (Result(s) reported on 03 Dec 2012 at 04:54PM.)  01-Jul-14 04:04   Result Comment  TROPONIN - RESULTS VERIFIED BY REPEAT TESTING.  - CALLED TO LANCE LANSAN:12/04/12 AT 07035 - READ-BACK PROCESS PERFORMED.  - TPL  Result(s) reported on 04 Dec 2012 at 05:25AM.  Glucose, Serum 80  BUN 9  Creatinine (comp) 0.83  Sodium, Serum 139  Potassium, Serum 4.0  Chloride, Serum 106  CO2, Serum 25  Calcium (Total), Serum 8.5  Osmolality (calc) 275  eGFR (African American) >60  eGFR (Non-African American) >60 (eGFR values <670mmin/1.73 m2 may be an indication of chronic kidney disease (CKD). Calculated eGFR is useful in patients with stable renal function. The eGFR calculation will not be reliable in acutely ill patients when serum creatinine is changing rapidly. It is not useful in  patients on dialysis. The eGFR calculation may not be applicable to patients at the low and high extremes of body sizes, pregnant women, and vegetarians.)  Anion Gap 8  Urine Drugs:  [id number] Tricyclic Antidepressant, Ur Qual (comp) NEGATIVE (Result(s) reported on 03 Dec 2012 at 01:23PM.)  Amphetamines, Urine Qual. NEGATIVE  MDMA, Urine Qual. NEGATIVE  Cocaine Metabolite, Urine Qual. NEGATIVE  Opiate, Urine qual NEGATIVE  Phencyclidine, Urine Qual. NEGATIVE  Cannabinoid, Urine Qual. NEGATIVE  Barbiturates, Urine Qual. NEGATIVE  Benzodiazepine, Urine Qual. NEGATIVE (----------------- The URINE DRUG SCREEN provides only a preliminary, unconfirmed analytical test result and should not be used for non-medical  purposes.  Clinical consideration and professional judgment should be  applied to any positive drug screen result due to possible interfering substances.  A more specific alternate chemical method must be used in order to obtain a confirmed analytical result.  Gas chromatography/mass spectrometry (GC/MS) is the preferred confirmatory method.)  Methadone, Urine Qual. NEGATIVE  Cardiac:  30-Jun-14 10:36   Troponin I < 0.02 (0.00-0.05 0.05 ng/mL or less: NEGATIVE  Repeat testing  in 3-6 hrs  if clinically indicated. >0.05 ng/mL: POTENTIAL  MYOCARDIAL INJURY. Repeat  testing in 3-6 hrs if  clinically indicated. NOTE: An increase or decrease  of 30% or more on serial  testing suggests a  clinically important change)  CK, Total 75  CPK-MB, Serum 2.5 (Result(s) reported on 03 Dec 2012 at 06:53PM.)    18:56   Troponin I 0.04 (0.00-0.05 0.05 ng/mL or less: NEGATIVE  Repeat testing in 3-6 hrs  if clinically indicated. >0.05 ng/mL: POTENTIAL  MYOCARDIAL INJURY. Repeat  testing in 3-6 hrs if  clinically indicated. NOTE: An increase or decrease  of 30% or more on serial  testing suggests a  clinically important change)  CK, Total 69  CPK-MB, Serum 2.6 (Result(s) reported on 03 Dec 2012 at 07:25PM.)  01-Jul-14 04:04   Troponin I  0.07 (0.00-0.05 0.05 ng/mL or less: NEGATIVE  Repeat testing in 3-6 hrs  if clinically indicated. >0.05 ng/mL: POTENTIAL  MYOCARDIAL INJURY. Repeat  testing in 3-6 hrs if  clinically indicated. NOTE: An increase or decrease  of 30% or more on serial  testing suggests a  clinically important change)  CK, Total 67  CPK-MB, Serum 2.2 (Result(s) reported on 04 Dec 2012 at 05:16AM.)  Routine UA:  30-Jun-14 12:54   Color (UA) Yellow  Clarity (UA) Clear  Glucose (UA) Negative  Bilirubin (UA) Negative  Ketones (UA) Trace  Specific Gravity (UA) 1.008  Blood (UA) Negative  pH (UA) 5.0  Protein (UA) Negative  Nitrite (UA) Negative  Leukocyte Esterase (UA) Negative (Result(s) reported on 03 Dec 2012 at 01:34PM.)  RBC (UA) <1 /HPF  WBC (UA) <1 /HPF  Bacteria (UA) TRACE  Epithelial Cells (UA) <1 /HPF  Hyaline Cast (UA) 1 /LPF (Result(s) reported on 03 Dec 2012 at 01:34PM.)  Routine Coag:  30-Jun-14 10:36   Prothrombin  33.7  INR 3.5 (INR reference interval applies to patients on anticoagulant therapy. A single INR therapeutic range for coumarins is not optimal for all indications; however, the suggested range for most  indications is 2.0 - 3.0. Exceptions to the INR Reference Range may include: Prosthetic heart valves, acute myocardial infarction, prevention of myocardial infarction, and combinations of aspirin and anticoagulant. The need for a higher or lower target INR must be assessed individually. Reference: The Pharmacology and Management of the Vitamin K  antagonists: the seventh ACCP Conference on Antithrombotic and Thrombolytic Therapy. IWOEH.2122 Sept:126 (3suppl): [id number]. A HCT value >55% may artifactually increase the PT.  In one study,  the increase was an average of 25%. Reference:  "Effect on Routine and Special Coagulation Testing Values of Citrate Anticoagulant Adjustment in Patients with High HCT Values." American Journal of Clinical Pathology 2006;126:400-405.)  01-Jul-14 04:04  Prothrombin  29.8  INR 2.9 (INR reference interval applies to patients on anticoagulant therapy. A single INR therapeutic range for coumarins is not optimal for all indications; however, the suggested range for most indications is 2.0 - 3.0. Exceptions to the INR Reference Range may include: Prosthetic heart valves, acute myocardial infarction, prevention of myocardial infarction, and combinations of aspirin and anticoagulant. The need for a higher or lower target INR must be assessed individually. Reference: The Pharmacology and Management of the Vitamin K  antagonists: the seventh ACCP Conference on Antithrombotic and Thrombolytic Therapy. [id number] Sept:126 (3suppl): [id number]. A HCT value >55% may artifactually increase the PT.  In one study,  the increase was an average of 25%. Reference:  "Effect on Routine and Special Coagulation Testing Values of Citrate Anticoagulant Adjustment in Patients with High HCT Values." American Journal of Clinical Pathology 2006;126:400-405.)  Routine Hem:  30-Jun-14 10:36   WBC (CBC)  3.4  RBC (CBC) 3.87  Hemoglobin (CBC) 12.3  Hematocrit (CBC) 36.5  Platelet  Count (CBC) 184 (Result(s) reported on 03 Dec 2012 at 12:23PM.)  MCV 94  MCH 31.8  MCHC 33.7  RDW  14.7  01-Jul-14 04:04   WBC (CBC) 5.7  RBC (CBC) 3.87  Hemoglobin (CBC) 12.2  Hematocrit (CBC) 36.3  Platelet Count (CBC) 184  MCV 94  MCH 31.6  MCHC 33.7  RDW 14.5  Neutrophil % 78.6  Lymphocyte % 9.0  Monocyte % 9.3  Eosinophil % 2.1  Basophil % 1.0  Neutrophil # 4.5  Lymphocyte #  0.5  Monocyte # 0.5  Eosinophil # 0.1  Basophil # 0.1 (Result(s) reported on 04 Dec 2012 at 05:09AM.)   CT:    30-Jun-14 20:27, CT Angiography Aorta Iliofemoral Runoff  CT Angiography Aorta Iliofemoral Runoff   REASON FOR EXAM:    PVD wirth worsening claudication, bluish   discoloration in feet  COMMENTS:       PROCEDURE: CT  - CT ANGIOGRAPHY AORTA RUNOFF  - Dec 03 2012  8:27PM     RESULT: Examination: CT  angiogram abdomen and pelvis were bilateral   lowerextremity runoffs.    Comparison: Peripheral vascular disease, claudication    Indication: Peripheral vascular disease.    Technique:  Multiple contiguous images were obtained from the lower chest   to the pubic symphysis following the dynamic administration of  158m     Isovue 370  intravenous contrast. Bilateral lower extremity runoffs were   also obtained.    Findings:  Limited views of the lung bases demonstrate no significant pulmonary   opacities.  There bilateral small pleural effusions.     The liver is normal in appearance.  There are no focal abnormalities.   There is no intra or extrahepatic biliary ductal dilatation.  The spleen,   gallbladder, and pancreas are unremarkable.  The adrenals are normal. The   kidneys are normal in size and enhance symmetrically.      The bowel is normal in caliber throughout, and there is no focal or   diffuse bowel wall thickening. There is no free air or free fluid.  There   is no significant retroperitoneal, pelvic, mesenteric, or inguinal     lymphadenopathy.  The bladder is  normal.      There is lower lumbar spine spondylosis.    Angiogram:    ABDOMEN/ PELVIS:    There is diffuse atherosclerosis of the abdominal aorta and its branches   down to the feet. It is widely patent down to  bifurcation.  The external   iliac arteries are widely patent. There is atherosclerotic plaque at the   origin of the SMA and celiac artery. There is atherosclerotic plaque   within the proximal renal arteries bilaterally.    LOWER EXTREMITIES:  RIGHT SIDE:    Common femoral artery: Widely patent.    Deep femoral artery: Widely patent.    SFA: Widely patent.    Popliteal:  Widely patent with a focal area of calcific atherosclerosis.    Lower leg arteries:  Anterior tibial is visualized down to the foot.   There is a patent dorsalis pedis. The peroneal artery is attenuated   distally and occludes above the level of ankle. The posterior tibial   arteries are visualized a distal to the proximal-mid right kidney.  LEFT SIDE:    Common femoral artery: Widely patent.    Deep femoral artery: Widely patent.    SFA: Widely patent.    Popliteal:  Widely patent.    Lower leg arteries:   Anterior tibial and posterior tibial arteries are   patent down to the foot.  There is a patent dorsalis pedis.  The peroneal   artery is attenuated distally and occludes above the level of ankle.    IMPRESSION:   1. Aortic runoff as described above.  2. Bilateral small pleural effusions.    Dictation Site: 1        Verified By: Jennette Banker, M.D., MD    Impression 1. Acute on chronic foot ischemia/severe venous insufficiency. Agree with aspirin therapy.  CTA of aorta with iliofemoral runoff show the arterial systemt is widely patent to the distal popliteal.  There is extensive tibial disease but this does not usually support rest pain.  At the same time she has severe venous stasis disease and this could be adding to the pain especially if she just sits all day (per her  grandaughter) in a dependent position. I will hold off on an angion now as she denies pain and plan to see her in the office to get ppg's (digital pressures) of the toes.  Also I have given the family informatin regarding graduated compression socks  specifically Jobst Sensifoot. 2.  Atrial fibrillation with rapid ventricular response. Patient on Cardizem.  3.  Hypertension. Will continue home medication.  4.  Hyperlipidemia. Will start Lipitor, get lipid panel in the morning.  5.  Hyponatremia. Will continue the patient on IV fluids at low rate. No diuretics at this time.  6.  History of chronic obstructive pulmonary disease. Will continue the patient on Symbicort. Will also add Xopenex inhaler every 4 hours while awake.   Plan level 4   Electronic Signatures: Hortencia Pilar (MD)  (Signed 01-Jul-14 19:25)  Authored: General Aspect/Present Illness, Home Medications, Allergies, History and Physical Exam, Vital Signs, Labs, Radiology, Impression/Plan   Last Updated: 01-Jul-14 19:25 by Hortencia Pilar (MD)

## 2014-09-26 NOTE — Op Note (Signed)
PATIENT NAME:  Ashley Rubio, Ashley Rubio MR#:  [id number] DATE OF BIRTH:  1935/10/30  DATE OF PROCEDURE:  12/18/2012  PREOPERATIVE DIAGNOSES:  1.  Atrial fibrillation with rapid ventricular response.  2.  Carcinoma of the tongue.  3.  History of deep vein thrombosis.   POSTOPERATIVE DIAGNOSES: 1.  Atrial fibrillation with rapid ventricular response.  2.  Carcinoma of the tongue.  3.  History of deep vein thrombosis.   PROCEDURES:  1. Ultrasound guidance for vascular access to right basilic  vein.  2. Fluoroscopic guidance for placement of catheter.  3. Insertion of double lumen peripherally inserted central venous catheter, right arm.  SURGEON: Hortencia Pilar, MD  INDICATION FOR PROCEDURE: Requiring multiple parenteral medications greater than 5 days.  DESCRIPTION OF PROCEDURE: The patient's right arm was sterilely prepped and draped, and a sterile surgical field was created. The basilic vein was accessed under direct ultrasound guidance without difficulty with a micropuncture needle and permanent image was recorded. 0.018 wire was then placed into the superior vena cava. Peel-away sheath was placed over the wire. A single lumen peripherally inserted central venous catheter was then placed over the wire and the wire and peel-away sheath were removed. The catheter tip was placed into the superior vena cava and was secured at the skin at 39 cm with a sterile dressing. The catheter withdrew blood well and flushed easily with heparinized saline. The patient tolerated procedure well.   ____________________________ Katha Cabal, MD ggs:aw D: 12/19/2012 08:24:18 ET T: 12/19/2012 09:05:05 ET JOB#: [id number]  cc: Katha Cabal, MD, <Dictator> Katha Cabal MD ELECTRONICALLY SIGNED 12/22/2012 11:14
